# Patient Record
Sex: Female | Born: 1970
Health system: Southern US, Community
[De-identification: ages and names within clinical notes are randomized; demographics above are authoritative.]

## PROBLEM LIST (undated history)

## (undated) ENCOUNTER — Inpatient Hospital Stay (HOSPITAL_COMMUNITY): Payer: Self-pay

## (undated) DIAGNOSIS — R12 Heartburn: Secondary | ICD-10-CM

## (undated) DIAGNOSIS — F329 Major depressive disorder, single episode, unspecified: Secondary | ICD-10-CM

## (undated) DIAGNOSIS — I10 Essential (primary) hypertension: Secondary | ICD-10-CM

## (undated) DIAGNOSIS — M1990 Unspecified osteoarthritis, unspecified site: Secondary | ICD-10-CM

## (undated) DIAGNOSIS — E785 Hyperlipidemia, unspecified: Secondary | ICD-10-CM

## (undated) DIAGNOSIS — O26899 Other specified pregnancy related conditions, unspecified trimester: Secondary | ICD-10-CM

## (undated) DIAGNOSIS — J45909 Unspecified asthma, uncomplicated: Secondary | ICD-10-CM

## (undated) DIAGNOSIS — E559 Vitamin D deficiency, unspecified: Secondary | ICD-10-CM

## (undated) DIAGNOSIS — R51 Headache: Secondary | ICD-10-CM

## (undated) DIAGNOSIS — G2581 Restless legs syndrome: Secondary | ICD-10-CM

## (undated) DIAGNOSIS — F419 Anxiety disorder, unspecified: Secondary | ICD-10-CM

## (undated) DIAGNOSIS — E119 Type 2 diabetes mellitus without complications: Secondary | ICD-10-CM

## (undated) DIAGNOSIS — F32A Depression, unspecified: Secondary | ICD-10-CM

## (undated) HISTORY — DX: Anxiety disorder, unspecified: F41.9

## (undated) HISTORY — DX: Vitamin D deficiency, unspecified: E55.9

## (undated) HISTORY — PX: MOUTH SURGERY: SHX715

---

## 1998-03-25 ENCOUNTER — Other Ambulatory Visit: Admission: RE | Admit: 1998-03-25 | Discharge: 1998-03-25 | Payer: Self-pay | Admitting: Obstetrics and Gynecology

## 1999-03-13 ENCOUNTER — Other Ambulatory Visit: Admission: RE | Admit: 1999-03-13 | Discharge: 1999-03-13 | Payer: Self-pay | Admitting: Obstetrics and Gynecology

## 1999-12-19 ENCOUNTER — Emergency Department (HOSPITAL_COMMUNITY): Admission: EM | Admit: 1999-12-19 | Discharge: 1999-12-19 | Payer: Self-pay | Admitting: Emergency Medicine

## 2000-01-15 ENCOUNTER — Other Ambulatory Visit: Admission: RE | Admit: 2000-01-15 | Discharge: 2000-01-15 | Payer: Self-pay | Admitting: *Deleted

## 2001-02-28 ENCOUNTER — Other Ambulatory Visit: Admission: RE | Admit: 2001-02-28 | Discharge: 2001-02-28 | Payer: Self-pay | Admitting: *Deleted

## 2003-08-20 ENCOUNTER — Emergency Department (HOSPITAL_COMMUNITY): Admission: EM | Admit: 2003-08-20 | Discharge: 2003-08-20 | Payer: Self-pay | Admitting: Family Medicine

## 2005-07-31 ENCOUNTER — Ambulatory Visit (HOSPITAL_COMMUNITY): Admission: RE | Admit: 2005-07-31 | Discharge: 2005-07-31 | Payer: Self-pay | Admitting: Unknown Physician Specialty

## 2008-08-19 ENCOUNTER — Ambulatory Visit (HOSPITAL_COMMUNITY): Admission: RE | Admit: 2008-08-19 | Discharge: 2008-08-19 | Payer: Self-pay | Admitting: Emergency Medicine

## 2009-01-10 ENCOUNTER — Ambulatory Visit (HOSPITAL_COMMUNITY): Admission: RE | Admit: 2009-01-10 | Discharge: 2009-01-10 | Payer: Self-pay | Admitting: Emergency Medicine

## 2012-05-17 DIAGNOSIS — E119 Type 2 diabetes mellitus without complications: Secondary | ICD-10-CM

## 2012-05-17 HISTORY — DX: Type 2 diabetes mellitus without complications: E11.9

## 2012-07-01 ENCOUNTER — Inpatient Hospital Stay (HOSPITAL_COMMUNITY)
Admission: AD | Admit: 2012-07-01 | Discharge: 2012-07-01 | Disposition: A | Discharge status: Home / Self Care | Payer: Medicaid Other | Source: Ambulatory Visit | Attending: Obstetrics & Gynecology | Admitting: Obstetrics & Gynecology

## 2012-07-01 ENCOUNTER — Encounter (HOSPITAL_COMMUNITY): Payer: Self-pay | Admitting: *Deleted

## 2012-07-01 DIAGNOSIS — O99891 Other specified diseases and conditions complicating pregnancy: Secondary | ICD-10-CM | POA: Insufficient documentation

## 2012-07-01 DIAGNOSIS — N888 Other specified noninflammatory disorders of cervix uteri: Secondary | ICD-10-CM

## 2012-07-01 DIAGNOSIS — N949 Unspecified condition associated with female genital organs and menstrual cycle: Secondary | ICD-10-CM | POA: Insufficient documentation

## 2012-07-01 HISTORY — DX: Depression, unspecified: F32.A

## 2012-07-01 HISTORY — DX: Essential (primary) hypertension: I10

## 2012-07-01 HISTORY — DX: Major depressive disorder, single episode, unspecified: F32.9

## 2012-07-01 LAB — URINALYSIS, ROUTINE W REFLEX MICROSCOPIC
Bilirubin Urine: NEGATIVE
Glucose, UA: NEGATIVE mg/dL
Ketones, ur: NEGATIVE mg/dL
Nitrite: NEGATIVE
Protein, ur: NEGATIVE mg/dL
Specific Gravity, Urine: 1.025 (ref 1.005–1.030)
Urobilinogen, UA: 0.2 mg/dL (ref 0.0–1.0)
pH: 6 (ref 5.0–8.0)

## 2012-07-01 LAB — WET PREP, GENITAL
Clue Cells Wet Prep HPF POC: NONE SEEN
Trich, Wet Prep: NONE SEEN
Yeast Wet Prep HPF POC: NONE SEEN

## 2012-07-01 LAB — URINE MICROSCOPIC-ADD ON

## 2012-07-01 LAB — POCT PREGNANCY, URINE: Preg Test, Ur: POSITIVE — AB

## 2012-07-01 NOTE — MAU Provider Note (Signed)
History     CSN: [id number]  Arrival date and time: 07/01/12 4098   First Provider Initiated Contact with Patient 07/01/12 1118      Chief Complaint  Patient presents with  . Vaginal Bleeding   HPI Ms. Natalie Kim is a 42 y.o. G2P1001 at [redacted]w[redacted]d who presents to MAU today complaining of brown vaginal discharge. The patient states that she noticed that this morning after she went to the bathroom she had a small amount of brownish discharge when she wiped. She has not noticed any since then or on her pad. She denies other abnormal discharge before today or vaginal bleeding. She does have some low back pain, but this is not a new complaint and is mild today. She denies abdominal pain. She has had some nausea without vomiting. She has had URI symptoms for the last few days, but no true fever. She denies UTI symptoms. She has an appointment to start prenatal care at [facility] on Monday.   OB History   Grav Para Term Preterm Abortions TAB SAB Ect Mult Living   2 1 1       1       Past Medical History  Diagnosis Date  . Hypertension     Lisinopril (weaned off by December)  . Depression     D/C Prozac in December    History reviewed. No pertinent past surgical history.  Family History  Problem Relation Age of Onset  . Hypertension Mother   . Cancer Maternal Grandmother   . Cancer Maternal Grandfather     History  Substance Use Topics  . Smoking status: Never Smoker   . Smokeless tobacco: Not on file  . Alcohol Use: No    Allergies:  Allergies  Allergen Reactions  . Latex Itching and Swelling    Prescriptions prior to admission  Medication Sig Dispense Refill  . acetaminophen (TYLENOL) 325 MG tablet Take 325 mg by mouth every 6 (six) hours as needed for pain.      . Prenatal Vit-Fe Fumarate-FA (PRENATAL MULTIVITAMIN) TABS Take 1 tablet by mouth daily.        Review of Systems  Constitutional: Negative for fever, chills and malaise/fatigue.  HENT: Positive for  congestion.   Respiratory: Positive for cough and sputum production. Negative for shortness of breath and wheezing.   Cardiovascular: Negative for chest pain.  Gastrointestinal: Positive for nausea. Negative for heartburn, vomiting, abdominal pain, diarrhea and constipation.  Genitourinary: Negative for dysuria, urgency and frequency.       Neg vaginal bleeding + vaginal discharge  Neurological: Negative for headaches.   Physical Exam   Blood pressure 142/80, pulse 86, temperature 98.3 F (36.8 C), temperature source Oral, resp. rate 16, height 5\' 2"  (1.575 m), weight 232 lb (105.235 kg), last menstrual period 04/30/2012.  Physical Exam  Constitutional: She is oriented to person, place, and time. She appears well-developed and well-nourished. No distress.  HENT:  Head: Normocephalic and atraumatic.  Cardiovascular: Normal rate, regular rhythm and normal heart sounds.   Respiratory: Effort normal and breath sounds normal. No respiratory distress.  GI: Soft. Bowel sounds are normal. She exhibits no distension and no mass. There is no tenderness. There is no rebound and no guarding.  Genitourinary: Vagina normal. Uterus is not enlarged and not tender. Cervix exhibits friability (cervix appears very friable and a small amount of bleeding was noted after exam). Cervix exhibits no motion tenderness and no discharge. Right adnexum displays no mass and no tenderness. Left  adnexum displays no mass and no tenderness.  Neurological: She is alert and oriented to person, place, and time.  Skin: Skin is warm and dry. No erythema.  Psychiatric: She has a normal mood and affect.   Results for orders placed during the hospital encounter of 07/01/12 (from the past 24 hour(s))  URINALYSIS, ROUTINE W REFLEX MICROSCOPIC     Status: Abnormal   Collection Time    07/01/12 10:10 AM      Result Value Range   Color, Urine YELLOW  YELLOW   APPearance CLEAR  CLEAR   Specific Gravity, Urine 1.025  1.005 - 1.030    pH 6.0  5.0 - 8.0   Glucose, UA NEGATIVE  NEGATIVE mg/dL   Hgb urine dipstick MODERATE (*) NEGATIVE   Bilirubin Urine NEGATIVE  NEGATIVE   Ketones, ur NEGATIVE  NEGATIVE mg/dL   Protein, ur NEGATIVE  NEGATIVE mg/dL   Urobilinogen, UA 0.2  0.0 - 1.0 mg/dL   Nitrite NEGATIVE  NEGATIVE   Leukocytes, UA TRACE (*) NEGATIVE  URINE MICROSCOPIC-ADD ON     Status: Abnormal   Collection Time    07/01/12 10:10 AM      Result Value Range   Squamous Epithelial / LPF FEW (*) RARE   WBC, UA 7-10  <3 WBC/hpf   RBC / HPF 0-2  <3 RBC/hpf   Bacteria, UA MANY (*) RARE  POCT PREGNANCY, URINE     Status: Abnormal   Collection Time    07/01/12 10:20 AM      Result Value Range   Preg Test, Ur POSITIVE (*) NEGATIVE  WET PREP, GENITAL     Status: Abnormal   Collection Time    07/01/12 11:37 AM      Result Value Range   Yeast Wet Prep HPF POC NONE SEEN  NONE SEEN   Trich, Wet Prep NONE SEEN  NONE SEEN   Clue Cells Wet Prep HPF POC NONE SEEN  NONE SEEN   WBC, Wet Prep HPF POC MANY (*) NONE SEEN    MAU Course  Procedures None  MDM GC/Chlamydia pending  Assessment and Plan  A: Friable cervix in pregnancy Vaginal discharge  P: Discharge home Patient instructed to avoid anything in vagina Patient encouraged to keep appointment for Monday to start [facility] with [facility] Bleeding precautions discussed Patient may return to MAU as needed or if her condition should change or worsen   Freddi Starr, PA-C  07/01/2012, 11:24 AM

## 2012-07-01 NOTE — MAU Note (Signed)
Pt states that she woke up this morning and is having brownish discharge and was confirmed pregnant the end of January and is concerned because she did not having any problems with her last pregnancy

## 2012-07-01 NOTE — MAU Note (Signed)
"  Brown spotting noticed after after using BR this morning (with wiping).  Mild lower back cramping.  I'm not sure if that is from me not being very active the past couple of days.  I have my first OB appt on Monday 07/03/12."

## 2012-07-02 LAB — URINE CULTURE: Colony Count: 50000

## 2012-07-02 LAB — GC/CHLAMYDIA PROBE AMP
CT Probe RNA: NEGATIVE
GC Probe RNA: NEGATIVE

## 2012-07-04 ENCOUNTER — Encounter (HOSPITAL_COMMUNITY): Payer: Self-pay | Admitting: Obstetrics

## 2012-07-24 ENCOUNTER — Other Ambulatory Visit: Payer: Self-pay | Admitting: Obstetrics

## 2012-07-24 DIAGNOSIS — O09529 Supervision of elderly multigravida, unspecified trimester: Secondary | ICD-10-CM

## 2012-07-24 DIAGNOSIS — Z3682 Encounter for antenatal screening for nuchal translucency: Secondary | ICD-10-CM

## 2012-07-25 ENCOUNTER — Ambulatory Visit (HOSPITAL_COMMUNITY)
Admission: RE | Admit: 2012-07-25 | Discharge: 2012-07-25 | Disposition: A | Discharge status: Home / Self Care | Payer: Medicaid Other | Source: Ambulatory Visit | Attending: Obstetrics | Admitting: Obstetrics

## 2012-07-25 ENCOUNTER — Encounter (HOSPITAL_COMMUNITY): Payer: Self-pay

## 2012-07-25 VITALS — BP 132/77 | HR 93 | Wt 237.5 lb

## 2012-07-25 DIAGNOSIS — O351XX0 Maternal care for (suspected) chromosomal abnormality in fetus, not applicable or unspecified: Secondary | ICD-10-CM | POA: Insufficient documentation

## 2012-07-25 DIAGNOSIS — O09529 Supervision of elderly multigravida, unspecified trimester: Secondary | ICD-10-CM | POA: Insufficient documentation

## 2012-07-25 DIAGNOSIS — Z3689 Encounter for other specified antenatal screening: Secondary | ICD-10-CM

## 2012-07-25 DIAGNOSIS — F32A Depression, unspecified: Secondary | ICD-10-CM

## 2012-07-25 DIAGNOSIS — I10 Essential (primary) hypertension: Secondary | ICD-10-CM

## 2012-07-25 DIAGNOSIS — O09519 Supervision of elderly primigravida, unspecified trimester: Secondary | ICD-10-CM | POA: Insufficient documentation

## 2012-07-25 DIAGNOSIS — O337XX0 Maternal care for disproportion due to other fetal deformities, not applicable or unspecified: Secondary | ICD-10-CM | POA: Insufficient documentation

## 2012-07-25 DIAGNOSIS — F329 Major depressive disorder, single episode, unspecified: Secondary | ICD-10-CM

## 2012-07-25 DIAGNOSIS — Z3682 Encounter for antenatal screening for nuchal translucency: Secondary | ICD-10-CM

## 2012-07-25 DIAGNOSIS — O352XX0 Maternal care for (suspected) hereditary disease in fetus, not applicable or unspecified: Secondary | ICD-10-CM | POA: Insufficient documentation

## 2012-07-25 DIAGNOSIS — O3510X0 Maternal care for (suspected) chromosomal abnormality in fetus, unspecified, not applicable or unspecified: Secondary | ICD-10-CM | POA: Insufficient documentation

## 2012-07-25 DIAGNOSIS — O09521 Supervision of elderly multigravida, first trimester: Secondary | ICD-10-CM

## 2012-07-25 NOTE — Progress Notes (Addendum)
Maternal Fetal Medicine Consultation Requesting Provider(s): [facility] Primary OB: Dr Clearance Coots Reason for consultation: 42 yo P1 at 12+ weeks, with [facility], and previous LGA fetus (9.12lbs)  HPI: As above.  OB History: G1 1995 SVD, pushed for 3 hours, and stated that there was no dystocia; she stated that they only had to place hands underneath the babies axilla to deliver her. Stated that she didnt have GDM. G2 present PMH: Obesity, Chronic HTN ( was on Lisinopril, started 7 years ago. Weaned in consultation with PMD, and finally stopped in 04/2012, prior to conception. Followed at PMD and states that she has normal BP's), and Depression PSH: History reviewed. No pertinent past surgical history Meds: PNV Allergies: Denies any food or drug allergies FH: History reviewed. No pertinent family history of diabetes, hypertension, cancers, heart disease, genetic abnormalities, and birth defects Soc: Denies any personal use or exposure to tobacco, alcohol, and illegal drugs.  Preconception Review of Systems: no vaginal bleeding or cramping, no nausea/vomiting. All other systems reviewed and are negative  PNL: N/A PE:  VS: BP      132/77        pulse     93            weight 237lb  GEN: well-appearing female, resting in chair in no acute distress  A/P: 42 yo P1 at 12+ weeks, with chronic hypertension, obesity, depression, and previous LGA fetus   Previous LGA fetus:  I discussed that women with previous LGA fetuses, are at risk for recurrence. We discussed the complications including but not limited to: labor dytocia and intrapartum fetal demise, post partum hemorrhage, and need for cesarean section.   History of Depression: Patient with history of depression, and she currentlys topped taking all medications on her own volition; secondary to fear of fetal effect. We advise to monitor closely for signs of depression and restart medication, SSRI, as needed.  Although there may be some risk  to fetus, the risks of untreated depression is worse.  Patient aware of signs of depression, and reported that she is currently doing well. Would not recommend Paxil during pregnancy as risk of fetal pulmonary hypertension and possible cardiac effects of concern.  Others are category C, but have no proven adverse effects.  Risks verses benefits should be reviewed again with patient if treatment started. With a history of depression and previous post partum depression this patient is at increased risk for recurrent post partum depression and should be monitored very closely following delivery.  Chronic Hypertension:  I discussed that women with preexistent hypertension are at increased risk of adverse pregnancy outcome, including preeclampsia, abruption, fetal growth restriction, and perinatal death. The risk increases with severity of hypertension and presence of end-organ damage. Risk of superimposed preeclampsia is 10 to 25 %; risk of abruption is 0.7 to 1.5 %; and risk of fetal growth restriction is 8 to 16 %. I also discussed that risk of preterm delivery is increased and is usually iatrogenic from the complications listed above. Risk of preterm delivery in women with chronic hypertension is 12-34%. There is also an increased risk of requiring C section for the above reasons. Patient' hypertension is currently managed without medication. I would recommend targeting therapy to keep systolic blood pressures <150 and diastolic blood pressures <100. If needs medical management may use labetalol- category C; may have increased risk of fetal growth restriction. I recommend obtaining baseline studies: EKG, 24 hour urine for total protein, CBC, AST, ALT, BUN,  creatinine, and uric acid. I recommend serial ultrasounds for fetal growth every 4 weeks starting at [redacted] weeks gestation. I recommend close surveillance for the development of signs/symptoms of preeclampsia especially in the third trimester. I recommend  antenatal testing to start at [redacted] weeks gestation. Recommend delivery by estimated due date but not prior to 38 weeks in the absence of other complications.  Obesity We discussed that obesity in pregnancy is associated with an increased risk of both early fetal loss and stillbirth. The reason for this increased still-birth risk with advancing gestation is likely multifactorial, but associated with placental dysfunction. The obese gravida is also at increased risk for preeclampsia, gestational or overt diabetes, other endocrinopathies (hypothyroidism), anesthetic complications related to sleep apnea, psychiatric disease including depression, risk for dysfunctional labor leading to a higher Cesarean delivery rate, and increased risk for thromboembolic disease.   AMA:  The patient was counseled by our genetics Counselor. Chromosomal as well as other risks of advanced maternal age were discussed, including but not limited to: increased risk for gestational diabetes, hypertensive disorders of pregnancy, fetal anomalies, and preterm delivery, and need for cesarean delivery.  Patient declined aneuploidy screening today.  Recommendations:  - Daily light exercise and dietary modifications under the supervision of a nutritionist, to maintain weight during pregnancy as she is morbidly obese and should only gain <11lbs for the entire pregnancy. - Aneuploidy screening if she changes her mind at any point in the pregnancy NIPT -Early diabetes screening and treatment if applicable -We recommend obtaining baseline studies: EKG, 24 hour urine for total protein, TSH, CBC, AST, ALT, BUN, creatinine, and uric acid.  -Prenatal compression stockings  -Recommend starting Labetalol if BP meds are required.  - Fetal surveillance in the form of:  - Detailed anatomical evaluation - Fetal echocardiogram - Serial growth ultrasounds (every 4weeks)  - Antepartum testing in the form of twice weekly non-stress tests with weekly  afi, or 2x weekly biophysical profile assessment to begin at 32 weeks. Testing may be initiated prior to then if any abnormalities of growth, fluid, placental blood flow, or worsening maternal status are discovered. -Recommend delivery at [facility], but no earlier than 38 weeks if remains stable. Delivery recommended sooner if clinically indicated.  Patient was seen and evaluated and case reviewed with Dr. Vincenza Hews  Thank you for the opportunity to be a part of the care of Mrs. Natalie Kim.  We will continue to follow her with ultrasounds in our [facility]. Please contact our office if we can be of further assistance.   I spent approximately 30 minutes with this patient with over 50% of time spent in face-to-face counseling.  MFM Attending  I counseled the patient with the fellow and agree with above note.  Eulis Foster, MD

## 2012-07-25 NOTE — Progress Notes (Addendum)
Genetic Counseling  High-Risk Gestation Note  Appointment Date:  07/25/2012 Referred By: Brock Bad, MD Date of Birth:  [birth year] Partner:  Natalie Kim   Pregnancy History: G2P1001 Estimated Date of Delivery: 02/04/13 Estimated Gestational Age: [redacted]w[redacted]d Attending: Eulis Foster, MD    Ms. Natalie Kim and her partner, Mr. Natalie Kim, were seen for genetic counseling because of a maternal age of 42 y.o.Marland Kitchen     They were counseled regarding maternal age and the association with risk for chromosome conditions due to nondisjunction with aging of the ova.   We reviewed chromosomes, nondisjunction, and the associated 1 in 43 risk for fetal aneuploidy related to a maternal age of 42 y.o. at [redacted]w[redacted]d gestation.  They were counseled that the risk for aneuploidy decreases as gestational age increases, accounting for those pregnancies which spontaneously abort.  We specifically discussed Down syndrome (trisomy 29), trisomies 75 and 54, and sex chromosome aneuploidies (47,XXX and 47,XXY) including the common features and prognoses of each.   We reviewed available screening options including First screen, Quad screen, noninvasive prenatal testing (NIPT), and detailed ultrasound. They understand that screening tests are used to modify a patient's a priori risk for aneuploidy, typically based on age.  This estimate provides a pregnancy specific risk assessment.  Specifically, we discussed that NIPT analyzes cell free fetal DNA found in the maternal circulation. This test is not diagnostic for chromosome conditions, but can provide information regarding the presence or absence of extra fetal DNA for chromosomes 13, 18, 21, X, and Y, and missing fetal DNA for chromosome X and Y (Turner syndrome). Thus, it would not identify or rule out all genetic conditions. The reported detection rate is greater than 99% for Trisomy 21, greater than 98% for Trisomy 18, and is approximately 80% (8 out of 10) for Trisomy 13.  The false positive rate is reported to be less than 0.1% for any of these conditions.  In addition, we discussed that ~50-80% of fetuses with Down syndrome and up to 90-95% of fetuses with trisomy 18/13, when well visualized, have detectable anomalies or soft markers by detailed ultrasound (~18+ weeks gestation).   They were also counseled regarding diagnostic testing via CVS or amniocentesis.  We reviewed the approximate 1 in 100 risk for complications for CVS and the approximate 1 in 300-500 risk for complications for amniocentesis, including spontaneous pregnancy loss. We discussed the risks, limitations, and benefits of each screening and testing option. After consideration of all the options, they declined NIPT, first trimester screening, Quad screening, CVS, and amniocentesis. Ms. Natalie Kim stated that she was not interested in CVS or amniocentesis during the pregnancy given the associated risk of complications. She stated that she may consider pursuing NIPT and possibly AFP only but elected to first wait for information from second trimester ultrasound assessment. Detailed ultrasound was scheduled for 09/05/12.  A complete ultrasound was performed today for nuchal translucency assessment.  The ultrasound report will be sent under separate cover. She understands that ultrasound cannot rule out all birth defects or genetic syndromes.   Ms. Natalie Kim was provided with written information regarding cystic fibrosis (CF) including the carrier frequency and incidence in the Caucasian population, the availability of carrier testing and prenatal diagnosis if indicated.  In addition, we discussed that CF is routinely screened for as part of the [place] newborn screening panel.  She declined testing today.   Both family histories were reviewed and found to be noncontributory for birth defects, mental  retardation, and known genetic conditions. Without further information regarding the provided family  history, an accurate genetic risk cannot be calculated. Further genetic counseling is warranted if more information is obtained.  Ms. Natalie Kim denied exposure to environmental toxins or chemical agents. She denied the use of alcohol, tobacco or street drugs following conception. She denied significant viral illnesses during the course of her pregnancy. Her medical and surgical histories were contributory for depression, for which she was taking Prozac and hypertension, for which she was taking lisinopril. She discontinued these medications in December. We discussed that available data regarding Prozac use during pregnancy does not indicate an expected increased risk for birth defects.  Using Prozac late in pregnancy can increase the chance for a mild transient neonatal syndrome of central nervous system including irritability, vomiting, jitteriness and convulsions, as well as neonatal pulmonary hypertension.  These associated symptoms typically do not appear to occur frequently or severely. Even though a limited number of medicines are known to cause birth defects, we cannot say that it is completely safe to use any medicines during pregnancy.  It is also not possible to predict any drug-drug interactions that occur, or how they might affect a pregnancy.  The use of medications is recommended in pregnancy only if the benefit to the mother (and thus the pregnancy) outweighs potential risks to the baby.  Sometimes the maternal use of medications, dictated by a medical condition, may even be more beneficial to a pregnancy than not taking the medication(s) at all. Natalie Kim was also seen for Maternal-Fetal Medicine consultation at the time of today's visit given her medical history and her history of previous delivery large for gestational age infant. See separate MFM consult note for detailed discussion.    I counseled this couple regarding the above risks and available options.  The approximate  face-to-face time with the genetic counselor was 40 minutes.  Natalie Plowman, MS,  Certified Genetic Counselor  07/25/2012

## 2012-07-25 NOTE — Progress Notes (Signed)
[facility] ultrasound  Indication: 42 yr old G2P1001 at [redacted]w[redacted]d with chronic hypertension and obesity for fetal ultrasound.  Findings: 1. Single intrauterine pregnancy. 2. Fetal crown rump length is consistent with dating. 3. Normal uterus; no adnexal masses seen. 4. Evaluation of fetal anatomy is limited by early gestational age. 5. The nuchal translucency could not be obtained secondary to fetal position. 6. The nasal bone was not visualized.  Recommendations: 1. Advanced maternal age: - see consult letter - patient met with genetic counselor; see separate report - patient declined aneuploidy screening - recommend serial fetal growth in the third trimester - recommend antenatal testing as below 2. Chronic  hypertension: - see consult letter - currently well controlled off of medication - recommend fetal growth every 4 weeks starting at [redacted] weeks gestation - recommend antenatal testing starting at [redacted] weeks gestation - recommend delivery by estimated due date but not prior to 38 weeks in the absence of other complications 3. Obesity: - see consult letter - recommend fetal growth surveillance as above 4. History of macrosomic infant: - recommend early 1 hour glucola - fetal growth surveillance as above 5. Recommend fetal anatomic survey at 18-[redacted] weeks gestation 6. Recommend maternal serum AFP at 15-[redacted] weeks gestation 7. Depression: - see consult letter  Eulis Foster, MD

## 2012-07-26 ENCOUNTER — Other Ambulatory Visit: Payer: Self-pay | Admitting: Obstetrics

## 2012-07-26 DIAGNOSIS — O09529 Supervision of elderly multigravida, unspecified trimester: Secondary | ICD-10-CM

## 2012-07-26 DIAGNOSIS — Z3682 Encounter for antenatal screening for nuchal translucency: Secondary | ICD-10-CM

## 2012-08-11 ENCOUNTER — Encounter: Payer: Self-pay | Admitting: *Deleted

## 2012-08-11 NOTE — Progress Notes (Unsigned)
Pulse: 84

## 2012-08-14 ENCOUNTER — Encounter: Payer: Self-pay | Admitting: Obstetrics

## 2012-08-14 ENCOUNTER — Ambulatory Visit (INDEPENDENT_AMBULATORY_CARE_PROVIDER_SITE_OTHER): Payer: Medicaid Other | Admitting: Obstetrics

## 2012-08-14 VITALS — BP 117/74 | Temp 98.9°F | Wt 236.6 lb

## 2012-08-14 DIAGNOSIS — Z3480 Encounter for supervision of other normal pregnancy, unspecified trimester: Secondary | ICD-10-CM

## 2012-08-14 DIAGNOSIS — Z3482 Encounter for supervision of other normal pregnancy, second trimester: Secondary | ICD-10-CM

## 2012-08-14 LAB — POCT URINALYSIS DIPSTICK
Bilirubin, UA: NEGATIVE
Blood, UA: NEGATIVE
Clarity, UA: NEGATIVE
Glucose, UA: 100
Nitrite, UA: NEGATIVE
Spec Grav, UA: >=1.03
Urobilinogen, UA: NEGATIVE
pH, UA: 5

## 2012-08-14 NOTE — Progress Notes (Signed)
No complaints. Doing well.

## 2012-08-14 NOTE — Progress Notes (Signed)
Pulse- 98 

## 2012-09-04 ENCOUNTER — Inpatient Hospital Stay (HOSPITAL_COMMUNITY)
Admission: AD | Admit: 2012-09-04 | Discharge: 2012-09-04 | Disposition: A | Discharge status: Home / Self Care | Payer: Medicaid Other | Source: Ambulatory Visit | Attending: Obstetrics & Gynecology | Admitting: Obstetrics & Gynecology

## 2012-09-04 ENCOUNTER — Encounter (HOSPITAL_COMMUNITY): Payer: Self-pay | Admitting: *Deleted

## 2012-09-04 DIAGNOSIS — N949 Unspecified condition associated with female genital organs and menstrual cycle: Secondary | ICD-10-CM | POA: Insufficient documentation

## 2012-09-04 DIAGNOSIS — O9989 Other specified diseases and conditions complicating pregnancy, childbirth and the puerperium: Secondary | ICD-10-CM

## 2012-09-04 DIAGNOSIS — R102 Pelvic and perineal pain: Secondary | ICD-10-CM

## 2012-09-04 DIAGNOSIS — R109 Unspecified abdominal pain: Secondary | ICD-10-CM | POA: Insufficient documentation

## 2012-09-04 DIAGNOSIS — O26899 Other specified pregnancy related conditions, unspecified trimester: Secondary | ICD-10-CM

## 2012-09-04 DIAGNOSIS — O99891 Other specified diseases and conditions complicating pregnancy: Secondary | ICD-10-CM | POA: Insufficient documentation

## 2012-09-04 HISTORY — DX: Hyperlipidemia, unspecified: E78.5

## 2012-09-04 LAB — URINALYSIS, ROUTINE W REFLEX MICROSCOPIC
Bilirubin Urine: NEGATIVE
Glucose, UA: NEGATIVE mg/dL
Ketones, ur: 40 mg/dL — AB
Leukocytes, UA: NEGATIVE
Nitrite: NEGATIVE
Protein, ur: NEGATIVE mg/dL
Specific Gravity, Urine: >1.03 — ABNORMAL HIGH (ref 1.005–1.030)
Urobilinogen, UA: 0.2 mg/dL (ref 0.0–1.0)
pH: 5.5 (ref 5.0–8.0)

## 2012-09-04 LAB — WET PREP, GENITAL
Clue Cells Wet Prep HPF POC: NONE SEEN
Trich, Wet Prep: NONE SEEN
Yeast Wet Prep HPF POC: NONE SEEN

## 2012-09-04 LAB — URINE MICROSCOPIC-ADD ON

## 2012-09-04 NOTE — MAU Note (Signed)
Pain is noticed with movement. Not so much when sitting still.

## 2012-09-04 NOTE — MAU Note (Signed)
Pt states lower abd pain, worse with movement. Could be gas, has not stopped since 10 am. Feels pressure in same area, suprapubic. Denies burning with voiding, no bleeding, no abnormal vag d/c.

## 2012-09-04 NOTE — MAU Provider Note (Signed)
  History     CSN: [id number]  Arrival date and time: 09/04/12 1316   None     Chief Complaint  Patient presents with  . Abdominal Pain   HPI 42 y.o. G2P1001 at [redacted]w[redacted]d with low abd/pelvic pain worse with position change and movement since this morning, no bleeding. Has OB appointment tomorrow.    Past Medical History  Diagnosis Date  . Hypertension     Lisinopril (weaned off by December)  . Depression     D/C Prozac in December  . Hyperlipidemia     Past Surgical History  Procedure Laterality Date  . Mouth surgery      Family History  Problem Relation Age of Onset  . Hypertension Mother   . Cancer Maternal Grandmother   . Cancer Maternal Grandfather     History  Substance Use Topics  . Smoking status: Never Smoker   . Smokeless tobacco: Not on file  . Alcohol Use: No    Allergies:  Allergies  Allergen Reactions  . Latex Itching and Swelling    Prescriptions prior to admission  Medication Sig Dispense Refill  . acetaminophen (TYLENOL) 325 MG tablet Take 325 mg by mouth every 6 (six) hours as needed for pain (headache).       . Prenatal Vit-Fe Fumarate-FA (PRENATAL MULTIVITAMIN) TABS Take 1 tablet by mouth daily.        Review of Systems  Constitutional: Negative.   Respiratory: Negative.   Cardiovascular: Negative.   Gastrointestinal: Positive for abdominal pain. Negative for nausea, vomiting, diarrhea and constipation.  Genitourinary: Negative for dysuria, urgency, frequency, hematuria and flank pain.       Negative for vaginal bleeding, vaginal discharge  Musculoskeletal: Negative.   Neurological: Negative.   Psychiatric/Behavioral: Negative.    Physical Exam   Blood pressure 115/68, pulse 101, temperature 98.1 F (36.7 C), temperature source Oral, resp. rate 16, height 5\' 2"  (1.575 m), weight 239 lb 4 oz (108.523 kg), last menstrual period 04/30/2012.  Physical Exam  Nursing note and vitals reviewed. Constitutional: She is oriented to person,  place, and time. She appears well-developed and well-nourished. No distress.  Cardiovascular: Normal rate.   Respiratory: Effort normal.  GI: Soft. She exhibits no mass. There is no tenderness. There is no rebound and no guarding.  Genitourinary: Vaginal discharge (thin, white, nonmalodorous) found.  Cervix long/thick/closed   Musculoskeletal: Normal range of motion.  Neurological: She is alert and oriented to person, place, and time.  Skin: Skin is warm and dry.  Psychiatric: She has a normal mood and affect.    MAU Course  Procedures Wet prep negative GC/CT collected Fern negative  Assessment and Plan   1. Pain of round ligament complicating pregnancy, antepartum   Rev'd precautions, f/u tomorrow as scheduled    Medication List    TAKE these medications       acetaminophen 325 MG tablet  Commonly known as:  TYLENOL  Take 325 mg by mouth every 6 (six) hours as needed for pain (headache).     prenatal multivitamin Tabs  Take 1 tablet by mouth daily.            Follow-up Information   Follow up with HARPER,CHARLES A, MD. (as scheduled tomorrow)    Contact information:   [address] [place] [address] [phone]         [facility] 09/04/2012, 3:43 PM

## 2012-09-05 ENCOUNTER — Encounter (HOSPITAL_COMMUNITY): Payer: Self-pay

## 2012-09-05 ENCOUNTER — Ambulatory Visit (HOSPITAL_COMMUNITY)
Admission: RE | Admit: 2012-09-05 | Discharge: 2012-09-05 | Disposition: A | Discharge status: Home / Self Care | Payer: Medicaid Other | Source: Ambulatory Visit | Attending: Obstetrics | Admitting: Obstetrics

## 2012-09-05 ENCOUNTER — Encounter: Payer: Self-pay | Admitting: Obstetrics

## 2012-09-05 VITALS — BP 132/79 | HR 104 | Wt 238.0 lb

## 2012-09-05 DIAGNOSIS — O09519 Supervision of elderly primigravida, unspecified trimester: Secondary | ICD-10-CM | POA: Insufficient documentation

## 2012-09-05 DIAGNOSIS — O352XX0 Maternal care for (suspected) hereditary disease in fetus, not applicable or unspecified: Secondary | ICD-10-CM | POA: Insufficient documentation

## 2012-09-05 DIAGNOSIS — Z1389 Encounter for screening for other disorder: Secondary | ICD-10-CM | POA: Insufficient documentation

## 2012-09-05 DIAGNOSIS — O337XX0 Maternal care for disproportion due to other fetal deformities, not applicable or unspecified: Secondary | ICD-10-CM | POA: Insufficient documentation

## 2012-09-05 DIAGNOSIS — O358XX0 Maternal care for other (suspected) fetal abnormality and damage, not applicable or unspecified: Secondary | ICD-10-CM | POA: Insufficient documentation

## 2012-09-05 DIAGNOSIS — O09522 Supervision of elderly multigravida, second trimester: Secondary | ICD-10-CM

## 2012-09-05 DIAGNOSIS — Z363 Encounter for antenatal screening for malformations: Secondary | ICD-10-CM | POA: Insufficient documentation

## 2012-09-05 DIAGNOSIS — Z3689 Encounter for other specified antenatal screening: Secondary | ICD-10-CM

## 2012-09-05 DIAGNOSIS — O09529 Supervision of elderly multigravida, unspecified trimester: Secondary | ICD-10-CM

## 2012-09-05 LAB — GC/CHLAMYDIA PROBE AMP
CT Probe RNA: NEGATIVE
GC Probe RNA: NEGATIVE

## 2012-09-05 NOTE — Progress Notes (Signed)
Natalie Kim  was seen today for an ultrasound appointment.  See full report in [facility].  Impression: Single IUP at 18 2/7 weeks Normal anatomic fetal survey; however, somewhat limited views of the fetal heart  and profile were obtained No markers associated with aneuploidy noted Normal amniotic fluid volume noted  Recommendations: Recommend follow-up ultrasound examination in 4 weeks to reevaluate the fetal heart and face  Alpha Gula, MD

## 2012-09-06 ENCOUNTER — Encounter: Payer: Self-pay | Admitting: Obstetrics

## 2012-09-11 ENCOUNTER — Ambulatory Visit (INDEPENDENT_AMBULATORY_CARE_PROVIDER_SITE_OTHER): Payer: Medicaid Other | Admitting: Obstetrics

## 2012-09-11 ENCOUNTER — Encounter: Payer: Self-pay | Admitting: Obstetrics

## 2012-09-11 VITALS — BP 118/77 | Temp 98.0°F | Wt 241.0 lb

## 2012-09-11 DIAGNOSIS — Z3482 Encounter for supervision of other normal pregnancy, second trimester: Secondary | ICD-10-CM

## 2012-09-11 DIAGNOSIS — Z3480 Encounter for supervision of other normal pregnancy, unspecified trimester: Secondary | ICD-10-CM

## 2012-09-11 LAB — POCT URINALYSIS DIPSTICK
Bilirubin, UA: NEGATIVE
Blood, UA: NEGATIVE
Glucose, UA: NEGATIVE
Leukocytes, UA: NEGATIVE
Nitrite, UA: NEGATIVE
Spec Grav, UA: 1.025
Urobilinogen, UA: NEGATIVE
pH, UA: 5

## 2012-09-11 LAB — HIV ANTIBODY (ROUTINE TESTING W REFLEX): HIV: NONREACTIVE

## 2012-09-11 NOTE — Progress Notes (Signed)
Pulse-89 No complaints.  

## 2012-09-12 LAB — OBSTETRIC PANEL
Antibody Screen: NEGATIVE
Basophils Absolute: 0 10*3/uL (ref 0.0–0.1)
Basophils Relative: 0 % (ref 0–1)
Eosinophils Absolute: 0.1 10*3/uL (ref 0.0–0.7)
Eosinophils Relative: 1 % (ref 0–5)
HCT: 38.1 % (ref 36.0–46.0)
Hemoglobin: 13 g/dL (ref 12.0–15.0)
Hepatitis B Surface Ag: NEGATIVE
Lymphocytes Relative: 15 % (ref 12–46)
Lymphs Abs: 1.2 10*3/uL (ref 0.7–4.0)
MCH: 30.4 pg (ref 26.0–34.0)
MCHC: 34.1 g/dL (ref 30.0–36.0)
MCV: 89 fL (ref 78.0–100.0)
Monocytes Absolute: 0.3 10*3/uL (ref 0.1–1.0)
Monocytes Relative: 4 % (ref 3–12)
Neutro Abs: 6 10*3/uL (ref 1.7–7.7)
Neutrophils Relative %: 80 % — ABNORMAL HIGH (ref 43–77)
Platelets: 205 10*3/uL (ref 150–400)
RBC: 4.28 MIL/uL (ref 3.87–5.11)
RDW: 14.3 % (ref 11.5–15.5)
Rh Type: POSITIVE
Rubella: 2.32 Index — ABNORMAL HIGH (ref <0.90)
WBC: 7.6 10*3/uL (ref 4.0–10.5)

## 2012-09-12 LAB — VITAMIN D 25 HYDROXY (VIT D DEFICIENCY, FRACTURES): Vit D, 25-Hydroxy: 23 ng/mL — ABNORMAL LOW (ref 30–89)

## 2012-09-13 LAB — HEMOGLOBINOPATHY EVALUATION
Hemoglobin Other: 0 %
Hgb A2 Quant: 2.9 % (ref 2.2–3.2)
Hgb A: 96.8 % (ref 96.8–97.8)
Hgb F Quant: 0.3 % (ref 0.0–2.0)
Hgb S Quant: 0 %

## 2012-09-13 LAB — CULTURE, OB URINE: Colony Count: 25000

## 2012-10-03 ENCOUNTER — Ambulatory Visit (HOSPITAL_COMMUNITY)
Admission: RE | Admit: 2012-10-03 | Discharge: 2012-10-03 | Disposition: A | Discharge status: Home / Self Care | Payer: Medicaid Other | Source: Ambulatory Visit | Attending: Obstetrics | Admitting: Obstetrics

## 2012-10-03 ENCOUNTER — Encounter (HOSPITAL_COMMUNITY): Payer: Self-pay

## 2012-10-03 VITALS — BP 113/72 | HR 97 | Wt 243.0 lb

## 2012-10-03 DIAGNOSIS — O09519 Supervision of elderly primigravida, unspecified trimester: Secondary | ICD-10-CM | POA: Insufficient documentation

## 2012-10-03 DIAGNOSIS — Z3689 Encounter for other specified antenatal screening: Secondary | ICD-10-CM | POA: Insufficient documentation

## 2012-10-03 DIAGNOSIS — O352XX0 Maternal care for (suspected) hereditary disease in fetus, not applicable or unspecified: Secondary | ICD-10-CM | POA: Insufficient documentation

## 2012-10-03 DIAGNOSIS — O09522 Supervision of elderly multigravida, second trimester: Secondary | ICD-10-CM

## 2012-10-03 DIAGNOSIS — O337XX0 Maternal care for disproportion due to other fetal deformities, not applicable or unspecified: Secondary | ICD-10-CM | POA: Insufficient documentation

## 2012-10-03 NOTE — Progress Notes (Signed)
[facility]  Indication: 42 yr old G2P1001 at [redacted]w[redacted]d with chronic hypertension and obesity for follow up fetal ultrasound.  Findings: 1. Single intrauterine pregnancy. 2. Estimated fetal weight is in the 56th%. 3. Anterior placenta without evidence of previa. 4. Normal amniotic fluid volume. 5. The limited anatomy survey is normal. Any anatomy not evaluated on today's exam was evaluated on the previous exam.  Recommendations: 1. Appropriate fetal growth. 2. Fetal anatomic survey is complete. 3.  Advanced maternal age: - previously counseled - patient declined aneuploidy screening - recommend serial fetal growth in the third trimester - recommend antenatal testing as below 2. Chronic  hypertension: - previously counseled - currently well controlled off of medication - recommend fetal growth every 4 weeks starting at [redacted] weeks gestation - recommend antenatal testing starting at [redacted] weeks gestation - recommend delivery by estimated due date but not prior to 38 weeks in the absence of other complications 3. Obesity: - previously counseled - recommend fetal growth surveillance as above 4. History of macrosomic infant: - recommend early 1 hour glucola - fetal growth surveillance as above  Eulis Foster, MD

## 2012-10-12 ENCOUNTER — Ambulatory Visit (INDEPENDENT_AMBULATORY_CARE_PROVIDER_SITE_OTHER): Payer: Medicaid Other | Admitting: Obstetrics

## 2012-10-12 ENCOUNTER — Encounter: Payer: Self-pay | Admitting: Obstetrics

## 2012-10-12 ENCOUNTER — Other Ambulatory Visit: Payer: Medicaid Other | Admitting: *Deleted

## 2012-10-12 VITALS — BP 119/79 | Temp 98.3°F | Wt 244.0 lb

## 2012-10-12 DIAGNOSIS — Z3480 Encounter for supervision of other normal pregnancy, unspecified trimester: Secondary | ICD-10-CM

## 2012-10-12 DIAGNOSIS — Z3482 Encounter for supervision of other normal pregnancy, second trimester: Secondary | ICD-10-CM

## 2012-10-12 LAB — POCT URINALYSIS DIPSTICK
Bilirubin, UA: NEGATIVE
Blood, UA: NEGATIVE
Glucose, UA: NEGATIVE
Ketones, UA: NEGATIVE
Leukocytes, UA: NEGATIVE
Nitrite, UA: NEGATIVE
Spec Grav, UA: 1.02
Urobilinogen, UA: NEGATIVE
pH, UA: 5

## 2012-10-12 NOTE — Progress Notes (Signed)
P 97 Patient reports some pain in upper abdomen - usually at end of the day.

## 2012-10-13 ENCOUNTER — Encounter: Payer: Self-pay | Admitting: Obstetrics

## 2012-10-13 LAB — GLUCOSE TOLERANCE, 2 HOURS W/ 1HR
Glucose, 1 hour: 220 mg/dL — ABNORMAL HIGH (ref 70–170)
Glucose, 2 hour: 169 mg/dL — ABNORMAL HIGH (ref 70–139)
Glucose, Fasting: 103 mg/dL — ABNORMAL HIGH (ref 70–99)

## 2012-10-13 LAB — CBC
HCT: 37.3 % (ref 36.0–46.0)
Hemoglobin: 12.5 g/dL (ref 12.0–15.0)
MCH: 31.1 pg (ref 26.0–34.0)
MCHC: 33.5 g/dL (ref 30.0–36.0)
MCV: 92.8 fL (ref 78.0–100.0)
Platelets: 220 10*3/uL (ref 150–400)
RBC: 4.02 MIL/uL (ref 3.87–5.11)
RDW: 14.5 % (ref 11.5–15.5)
WBC: 8.6 10*3/uL (ref 4.0–10.5)

## 2012-10-13 LAB — RPR

## 2012-10-13 LAB — HIV ANTIBODY (ROUTINE TESTING W REFLEX): HIV: NONREACTIVE

## 2012-10-19 ENCOUNTER — Encounter: Payer: Self-pay | Admitting: Obstetrics

## 2012-10-24 ENCOUNTER — Telehealth: Payer: Self-pay | Admitting: *Deleted

## 2012-10-24 MED ORDER — BUPROPION HCL 100 MG PO TABS: ORAL_TABLET | ORAL | Status: DC

## 2012-10-24 NOTE — Telephone Encounter (Signed)
Call placed to patient [phone], no answer. A voice message was left informing patient call was being return regarding my chart message for medication refill. Patient was advised to return phone call for clarification.  After speaking with Dr Clearance Coots, my chart message sent to patient informing patient of Rx for Wellbutrin sent to [facility].  No further action is required.

## 2012-10-26 ENCOUNTER — Ambulatory Visit (INDEPENDENT_AMBULATORY_CARE_PROVIDER_SITE_OTHER): Payer: Medicaid Other | Admitting: Obstetrics

## 2012-10-26 ENCOUNTER — Encounter: Payer: Self-pay | Admitting: *Deleted

## 2012-10-26 VITALS — BP 116/80 | Temp 97.9°F | Wt 246.0 lb

## 2012-10-26 DIAGNOSIS — O99810 Abnormal glucose complicating pregnancy: Secondary | ICD-10-CM

## 2012-10-26 DIAGNOSIS — Z3482 Encounter for supervision of other normal pregnancy, second trimester: Secondary | ICD-10-CM

## 2012-10-26 DIAGNOSIS — O24419 Gestational diabetes mellitus in pregnancy, unspecified control: Secondary | ICD-10-CM | POA: Insufficient documentation

## 2012-10-26 DIAGNOSIS — Z3480 Encounter for supervision of other normal pregnancy, unspecified trimester: Secondary | ICD-10-CM

## 2012-10-26 LAB — POCT URINALYSIS DIPSTICK
Bilirubin, UA: NEGATIVE
Nitrite, UA: NEGATIVE
Spec Grav, UA: 1.025
Urobilinogen, UA: NEGATIVE
pH, UA: 5

## 2012-10-26 MED ORDER — GLYBURIDE 2.5 MG PO TABS: 2.5000 mg | ORAL_TABLET | Freq: Two times a day (BID) | ORAL | Status: DC

## 2012-10-26 NOTE — Progress Notes (Signed)
Pulse-95 No complaints.

## 2012-10-31 ENCOUNTER — Encounter: Payer: Self-pay | Admitting: Obstetrics

## 2012-10-31 ENCOUNTER — Encounter (HOSPITAL_COMMUNITY): Payer: Self-pay

## 2012-10-31 ENCOUNTER — Ambulatory Visit (HOSPITAL_COMMUNITY)
Admission: RE | Admit: 2012-10-31 | Discharge: 2012-10-31 | Disposition: A | Discharge status: Home / Self Care | Payer: Medicaid Other | Source: Ambulatory Visit | Attending: Obstetrics | Admitting: Obstetrics

## 2012-10-31 VITALS — BP 138/81 | HR 117 | Wt 243.5 lb

## 2012-10-31 DIAGNOSIS — O24419 Gestational diabetes mellitus in pregnancy, unspecified control: Secondary | ICD-10-CM

## 2012-10-31 DIAGNOSIS — O337XX0 Maternal care for disproportion due to other fetal deformities, not applicable or unspecified: Secondary | ICD-10-CM | POA: Insufficient documentation

## 2012-10-31 DIAGNOSIS — O09529 Supervision of elderly multigravida, unspecified trimester: Secondary | ICD-10-CM | POA: Insufficient documentation

## 2012-10-31 DIAGNOSIS — O09522 Supervision of elderly multigravida, second trimester: Secondary | ICD-10-CM

## 2012-10-31 DIAGNOSIS — O352XX0 Maternal care for (suspected) hereditary disease in fetus, not applicable or unspecified: Secondary | ICD-10-CM | POA: Insufficient documentation

## 2012-10-31 DIAGNOSIS — E669 Obesity, unspecified: Secondary | ICD-10-CM | POA: Insufficient documentation

## 2012-10-31 DIAGNOSIS — O99810 Abnormal glucose complicating pregnancy: Secondary | ICD-10-CM | POA: Insufficient documentation

## 2012-11-01 ENCOUNTER — Other Ambulatory Visit: Payer: Self-pay

## 2012-11-02 ENCOUNTER — Encounter: Payer: Medicaid Other | Attending: Obstetrics | Admitting: *Deleted

## 2012-11-02 ENCOUNTER — Ambulatory Visit (HOSPITAL_COMMUNITY)
Admission: RE | Admit: 2012-11-02 | Discharge: 2012-11-02 | Disposition: A | Discharge status: Home / Self Care | Payer: Medicaid Other | Source: Ambulatory Visit | Attending: Obstetrics | Admitting: Obstetrics

## 2012-11-02 DIAGNOSIS — O99810 Abnormal glucose complicating pregnancy: Secondary | ICD-10-CM | POA: Insufficient documentation

## 2012-11-02 DIAGNOSIS — Z713 Dietary counseling and surveillance: Secondary | ICD-10-CM | POA: Insufficient documentation

## 2012-11-02 NOTE — ED Notes (Signed)
11-02-2012 Visit with patient today regarding diabetes education blood sugar control. Pt has had basics of carbohydrate counting and cbg target goals. Pt was not aware of carbohydrate portion in grams per meals and snacks. However, she has done very well trying to divide up her grams carbohydrate to total the limit she was given.  Recommended she eat 30 grams for breakfast with instruction for no fruit juice nor fruits until afternoon. She is to have 15 gram snacks between each meal and at HS. She is to have 75-100 grams protein per day as well. Pt and mother working together to adhere to [employer]. She states that when she took Glyburide in the am, she did "crash" (hypoglycemia) one day before lunch. She now only takes 5 mg Glyburide at HS. Her fasting glucose values range from 110-175.  Her glucose at HS is typically higher than it is the next morning fasting.  Explained the hormonal changes and effects on glucose. Explained the physiology of glucose and insulin and the imbalance with GDM. It may be that this pt would benefit from starting with 10 units NPH insulin at HS to control fasting glucose 60-90 mg/dL. Otherwise, may consider increase in Glyburide at HS to 7.5 mg or even 10 mg. I introduced the idea of NPH at bedtime and explained how it would benefit her body and protect the baby from getting too much glucose during the early am hours. Pt is trying and asked appropriate questions. She is having some problems with her Medicaid not covering her supplies and meds.  I gave her the [facility] financial advisor's phone extensions for assistance. I offered her other options than [facility] to get her prescriptions.  Pt ane mother very receptive to all teaching.   Thank you, Lenor Coffin, RN, Diabetes CNS [phone])

## 2012-11-03 ENCOUNTER — Encounter: Payer: Self-pay | Admitting: Obstetrics

## 2012-11-04 ENCOUNTER — Other Ambulatory Visit: Payer: Self-pay

## 2012-11-06 ENCOUNTER — Encounter: Payer: Self-pay | Admitting: Obstetrics

## 2012-11-06 ENCOUNTER — Telehealth (HOSPITAL_COMMUNITY): Payer: Self-pay | Admitting: *Deleted

## 2012-11-06 ENCOUNTER — Other Ambulatory Visit: Payer: Self-pay

## 2012-11-06 ENCOUNTER — Ambulatory Visit (INDEPENDENT_AMBULATORY_CARE_PROVIDER_SITE_OTHER): Payer: Medicaid Other | Admitting: Obstetrics

## 2012-11-06 VITALS — BP 105/71 | Temp 98.8°F | Wt 242.0 lb

## 2012-11-06 DIAGNOSIS — Z3483 Encounter for supervision of other normal pregnancy, third trimester: Secondary | ICD-10-CM

## 2012-11-06 DIAGNOSIS — Z3480 Encounter for supervision of other normal pregnancy, unspecified trimester: Secondary | ICD-10-CM

## 2012-11-06 DIAGNOSIS — F329 Major depressive disorder, single episode, unspecified: Secondary | ICD-10-CM

## 2012-11-06 DIAGNOSIS — F3289 Other specified depressive episodes: Secondary | ICD-10-CM

## 2012-11-06 LAB — POCT URINALYSIS DIPSTICK
Bilirubin, UA: NEGATIVE
Blood, UA: NEGATIVE
Glucose, UA: NEGATIVE
Ketones, UA: NEGATIVE
Nitrite, UA: NEGATIVE
Spec Grav, UA: >=1.03
Urobilinogen, UA: NEGATIVE
pH, UA: 5

## 2012-11-06 MED ORDER — BUPROPION HCL ER (XL) 150 MG PO TB24: 150.0000 mg | ORAL_TABLET | Freq: Every day | ORAL | Status: DC

## 2012-11-06 NOTE — Progress Notes (Signed)
Pulse 87 patient states she has no concerns today

## 2012-11-06 NOTE — Telephone Encounter (Signed)
Natalie Kim faxed in her blood sugars today.  She had reviewed them with Lenor Coffin, diabetes educator, on last Thursday.  She has an appointment with Dr. Clearance Coots this morning.  Instructed patient to show her blood sugars to Dr. Clearance Coots this morning and have him look over the diabetic educators note.  If he makes any changes to her medications to write those on her blood sugar log that she will fax to me next week.  Pt verbalized understanding.

## 2012-11-13 ENCOUNTER — Telehealth (HOSPITAL_COMMUNITY): Payer: Self-pay | Admitting: *Deleted

## 2012-11-13 NOTE — Telephone Encounter (Signed)
Natalie Kim called back to discuss her blood sugars logs for this week.  After reviewing with Dr. Sherrie George, her Glyburide was increased form 2.5mg  in the am to 5mg  and from 7.5mg  at night to 10mg  at night. Pt verbalized understanding.  She was also scheduled to come in on Thursday at 430 to see ann clark diabetes educator to discuss beginning insulin.  Pt agreeable to appointment time.  See scanned documents for blood sugar logs.

## 2012-11-15 ENCOUNTER — Ambulatory Visit (INDEPENDENT_AMBULATORY_CARE_PROVIDER_SITE_OTHER): Payer: Medicaid Other | Admitting: Obstetrics

## 2012-11-15 ENCOUNTER — Encounter: Payer: Self-pay | Admitting: Obstetrics

## 2012-11-15 VITALS — BP 118/75 | Temp 97.8°F | Wt 242.0 lb

## 2012-11-15 DIAGNOSIS — O99810 Abnormal glucose complicating pregnancy: Secondary | ICD-10-CM | POA: Insufficient documentation

## 2012-11-15 DIAGNOSIS — Z3480 Encounter for supervision of other normal pregnancy, unspecified trimester: Secondary | ICD-10-CM

## 2012-11-15 DIAGNOSIS — Z3483 Encounter for supervision of other normal pregnancy, third trimester: Secondary | ICD-10-CM

## 2012-11-15 DIAGNOSIS — O0990 Supervision of high risk pregnancy, unspecified, unspecified trimester: Secondary | ICD-10-CM

## 2012-11-15 LAB — POCT URINALYSIS DIPSTICK
Blood, UA: NEGATIVE
Glucose, UA: NEGATIVE
Nitrite, UA: NEGATIVE
Spec Grav, UA: 1.02
pH, UA: 6

## 2012-11-15 NOTE — Progress Notes (Signed)
Pulse-105 No complaint

## 2012-11-16 ENCOUNTER — Encounter: Payer: Medicaid Other | Attending: Obstetrics | Admitting: *Deleted

## 2012-11-16 ENCOUNTER — Ambulatory Visit (HOSPITAL_COMMUNITY)
Admission: RE | Admit: 2012-11-16 | Discharge: 2012-11-16 | Disposition: A | Discharge status: Home / Self Care | Payer: Medicaid Other | Source: Ambulatory Visit | Attending: Obstetrics & Gynecology | Admitting: Obstetrics & Gynecology

## 2012-11-16 DIAGNOSIS — O99810 Abnormal glucose complicating pregnancy: Secondary | ICD-10-CM | POA: Insufficient documentation

## 2012-11-16 DIAGNOSIS — Z713 Dietary counseling and surveillance: Secondary | ICD-10-CM | POA: Insufficient documentation

## 2012-11-16 NOTE — ED Notes (Signed)
Diabetes Treatment Program Recommendations  ADA Standards of Care 2012 Diabetes in Pregnancy Target Glucose Ranges:  Fasting: 60 - 90 mg/dL Preprandial: 60 - 295 mg/dL 1 hr postprandial: Less than 140mg /dL (from first bite of meal) 2 hr postprandial: Less than 120 mg/dL (from first bit of meal)  Met with patient and mother of pt today.  Glucose results still above target goals even though she states that she has followed the diet guidelines exactly as given and explained.  Taught pt how to use an insulin pen and recommended that pt start on NPH 15 units in the am and 15 units at HS. Pt return demonstrated how to use the pen and expressed understanding. All other questions and concerns addressed. Thank you, Lenor Coffin, RN, Diabetes CNS [phone])

## 2012-11-22 ENCOUNTER — Encounter: Payer: Self-pay | Admitting: Obstetrics

## 2012-11-22 ENCOUNTER — Ambulatory Visit (INDEPENDENT_AMBULATORY_CARE_PROVIDER_SITE_OTHER): Payer: Medicaid Other | Admitting: Obstetrics

## 2012-11-22 VITALS — BP 99/61 | Wt 244.8 lb

## 2012-11-22 DIAGNOSIS — Z3483 Encounter for supervision of other normal pregnancy, third trimester: Secondary | ICD-10-CM

## 2012-11-22 DIAGNOSIS — Z3482 Encounter for supervision of other normal pregnancy, second trimester: Secondary | ICD-10-CM

## 2012-11-22 DIAGNOSIS — Z3480 Encounter for supervision of other normal pregnancy, unspecified trimester: Secondary | ICD-10-CM

## 2012-11-22 LAB — POCT URINALYSIS DIPSTICK
Blood, UA: NEGATIVE
Glucose, UA: NEGATIVE
Ketones, UA: 1
Leukocytes, UA: NEGATIVE
Nitrite, UA: NEGATIVE
Spec Grav, UA: 1.025
pH, UA: 5

## 2012-11-22 NOTE — Progress Notes (Signed)
Pulse: 97

## 2012-11-23 ENCOUNTER — Other Ambulatory Visit: Payer: Self-pay

## 2012-11-23 ENCOUNTER — Encounter (HOSPITAL_COMMUNITY): Payer: Self-pay | Admitting: *Deleted

## 2012-11-23 ENCOUNTER — Telehealth (HOSPITAL_COMMUNITY): Payer: Self-pay | Admitting: *Deleted

## 2012-11-23 NOTE — Telephone Encounter (Signed)
Natalie Kim faxed in her blood sugars today.  Reviewed with Dr. Sherrie George. Will increase NPH from 15 units in the am to 20 units, and will increase from 15 units pm to 20 units.  Pt verbalized understanding.  She will fax in her blood sugars next Thursday.  Instructed pt to call with any concerns or questions.

## 2012-11-27 ENCOUNTER — Other Ambulatory Visit: Payer: Self-pay

## 2012-11-28 ENCOUNTER — Ambulatory Visit (HOSPITAL_COMMUNITY)
Admission: RE | Admit: 2012-11-28 | Discharge: 2012-11-28 | Disposition: A | Discharge status: Home / Self Care | Payer: Medicaid Other | Source: Ambulatory Visit | Attending: Obstetrics | Admitting: Obstetrics

## 2012-11-28 ENCOUNTER — Encounter: Payer: Self-pay | Admitting: Obstetrics & Gynecology

## 2012-11-28 DIAGNOSIS — O09522 Supervision of elderly multigravida, second trimester: Secondary | ICD-10-CM

## 2012-11-28 DIAGNOSIS — O352XX0 Maternal care for (suspected) hereditary disease in fetus, not applicable or unspecified: Secondary | ICD-10-CM | POA: Insufficient documentation

## 2012-11-28 DIAGNOSIS — E669 Obesity, unspecified: Secondary | ICD-10-CM | POA: Insufficient documentation

## 2012-11-28 DIAGNOSIS — O24419 Gestational diabetes mellitus in pregnancy, unspecified control: Secondary | ICD-10-CM

## 2012-11-28 DIAGNOSIS — O337XX0 Maternal care for disproportion due to other fetal deformities, not applicable or unspecified: Secondary | ICD-10-CM | POA: Insufficient documentation

## 2012-11-28 DIAGNOSIS — O99810 Abnormal glucose complicating pregnancy: Secondary | ICD-10-CM | POA: Insufficient documentation

## 2012-11-28 DIAGNOSIS — O09529 Supervision of elderly multigravida, unspecified trimester: Secondary | ICD-10-CM | POA: Insufficient documentation

## 2012-11-28 LAB — US OB DETAIL + 14 WK

## 2012-11-28 NOTE — Progress Notes (Signed)
BSs reviewed Fastings between 100 and 132 2 hr pps all > 120 except for after lunch  Currently on Humulin N 20 units in AM and 20 units in PM; changed to Humulin N 25 units at HS; keep AM dose at 20 units; add Humalog 6 units before breakfast and dinner.  Patient to call BSs in this Friday afternoon.

## 2012-11-29 ENCOUNTER — Telehealth (HOSPITAL_COMMUNITY): Payer: Self-pay | Admitting: *Deleted

## 2012-11-29 NOTE — Telephone Encounter (Signed)
Pharmacy sent in request for prior authorization.  Called for prior authorization and will take 24 hours to find out if it is authorized.  Discussed with Dr. Sherrie George about potential for prescription to not be authorized.  Decided to call in prescription for novolog 6 units before breakfast and 6 units before dinner.  Called Natalie Kim to discuss this new prescription.  She verbalized understanding.  Pt seems hesitant about pulling up the insulin herself.  Arranged for patient to come in tomorrow afternoon to show her proper medication administration.

## 2012-11-30 ENCOUNTER — Other Ambulatory Visit: Payer: Self-pay

## 2012-12-05 ENCOUNTER — Other Ambulatory Visit: Payer: Self-pay | Admitting: Obstetrics

## 2012-12-05 ENCOUNTER — Telehealth (HOSPITAL_COMMUNITY): Payer: Self-pay | Admitting: *Deleted

## 2012-12-05 DIAGNOSIS — E119 Type 2 diabetes mellitus without complications: Secondary | ICD-10-CM

## 2012-12-05 DIAGNOSIS — O09523 Supervision of elderly multigravida, third trimester: Secondary | ICD-10-CM

## 2012-12-05 DIAGNOSIS — O24913 Unspecified diabetes mellitus in pregnancy, third trimester: Secondary | ICD-10-CM

## 2012-12-05 NOTE — Telephone Encounter (Signed)
Natalie Kim faxed in her blood sugars today.  Reviewed with Dr. Claudean Severance.  Will add 6 units novolog before lunch. Keep humulin N at 20 units am and 25 units pm and novolog 6 units with both breakfast and dinner.  Pt verbalized understanding.  Pt was also set up for twice weekly testing as she will be 32 weeks next week.  appts made and pt notified.

## 2012-12-06 ENCOUNTER — Ambulatory Visit (INDEPENDENT_AMBULATORY_CARE_PROVIDER_SITE_OTHER): Payer: Medicaid Other | Admitting: Obstetrics

## 2012-12-06 ENCOUNTER — Encounter: Payer: Self-pay | Admitting: Obstetrics

## 2012-12-06 VITALS — BP 114/79 | Temp 98.0°F | Wt 243.0 lb

## 2012-12-06 DIAGNOSIS — Z3480 Encounter for supervision of other normal pregnancy, unspecified trimester: Secondary | ICD-10-CM

## 2012-12-06 DIAGNOSIS — Z3483 Encounter for supervision of other normal pregnancy, third trimester: Secondary | ICD-10-CM

## 2012-12-06 LAB — POCT URINALYSIS DIPSTICK
Bilirubin, UA: NEGATIVE
Blood, UA: NEGATIVE
Glucose, UA: NEGATIVE
Leukocytes, UA: NEGATIVE
Nitrite, UA: NEGATIVE
Spec Grav, UA: >=1.03
Urobilinogen, UA: NEGATIVE
pH, UA: 5

## 2012-12-06 NOTE — Progress Notes (Signed)
p=96 

## 2012-12-11 ENCOUNTER — Ambulatory Visit (HOSPITAL_COMMUNITY)
Admission: RE | Admit: 2012-12-11 | Discharge: 2012-12-11 | Disposition: A | Discharge status: Home / Self Care | Payer: Medicaid Other | Source: Ambulatory Visit | Attending: Obstetrics | Admitting: Obstetrics

## 2012-12-11 ENCOUNTER — Other Ambulatory Visit: Payer: Self-pay

## 2012-12-11 ENCOUNTER — Encounter: Payer: Self-pay | Admitting: Obstetrics

## 2012-12-11 DIAGNOSIS — E669 Obesity, unspecified: Secondary | ICD-10-CM | POA: Insufficient documentation

## 2012-12-11 DIAGNOSIS — O99210 Obesity complicating pregnancy, unspecified trimester: Secondary | ICD-10-CM | POA: Insufficient documentation

## 2012-12-11 DIAGNOSIS — O09529 Supervision of elderly multigravida, unspecified trimester: Secondary | ICD-10-CM | POA: Insufficient documentation

## 2012-12-11 DIAGNOSIS — O352XX0 Maternal care for (suspected) hereditary disease in fetus, not applicable or unspecified: Secondary | ICD-10-CM | POA: Insufficient documentation

## 2012-12-11 DIAGNOSIS — E119 Type 2 diabetes mellitus without complications: Secondary | ICD-10-CM

## 2012-12-11 DIAGNOSIS — O99810 Abnormal glucose complicating pregnancy: Secondary | ICD-10-CM | POA: Insufficient documentation

## 2012-12-11 DIAGNOSIS — O09523 Supervision of elderly multigravida, third trimester: Secondary | ICD-10-CM

## 2012-12-11 DIAGNOSIS — O337XX0 Maternal care for disproportion due to other fetal deformities, not applicable or unspecified: Secondary | ICD-10-CM | POA: Insufficient documentation

## 2012-12-11 LAB — US OB DETAIL + 14 WK

## 2012-12-11 NOTE — Consult Note (Signed)
MFM Consultation  Natalie Kim was seen today for an MFM consultation appointment.   Impressions:  Single IUP A2 GDM on insulin- inadequate control    Patient is currently on NPH 20/25 and Novolog 6/6/6. Glycemic measures are still elevated.  Recommendations:  I counseled her on diet and adjusted NPH to 22/28.  Maintain Novolog at 6/6/6 with meals. initiate 2x weekly NSTs with weekly amniotic fluid assessment.  Recommend delivery at 39 weeks due to A2 GDM.   Time Spent:  I spent in excess of 15 minutes in consultation with this patient to review records, evaluate her case, and provide her with an adequate discussion and education. More than 50% of this time was spent in direct face-to-face counseling.  It was a pleasure seeing your patient in the office today. Thank you for consultation. Please do not hesitate to contact our service for any further questions.   Louann Sjogren Natalie Kim, Louann Sjogren, MD, MS, FACOG  Assistant Professor  Section of Maternal-Fetal Medicine  [facility]

## 2012-12-12 ENCOUNTER — Telehealth (HOSPITAL_COMMUNITY): Payer: Self-pay | Admitting: *Deleted

## 2012-12-12 NOTE — Telephone Encounter (Signed)
Delaine called for a refill of her Humulin N pen and needles.  Prescription called in to [facility] on [address] for 20 units am and 25 units pm.  Pt notified that prescription has been called in.

## 2012-12-14 ENCOUNTER — Other Ambulatory Visit: Payer: Self-pay | Admitting: Obstetrics

## 2012-12-14 ENCOUNTER — Ambulatory Visit (HOSPITAL_COMMUNITY)
Admission: RE | Admit: 2012-12-14 | Discharge: 2012-12-14 | Disposition: A | Discharge status: Home / Self Care | Payer: Medicaid Other | Source: Ambulatory Visit | Attending: Obstetrics | Admitting: Obstetrics

## 2012-12-14 DIAGNOSIS — O99810 Abnormal glucose complicating pregnancy: Secondary | ICD-10-CM | POA: Insufficient documentation

## 2012-12-14 DIAGNOSIS — O09529 Supervision of elderly multigravida, unspecified trimester: Secondary | ICD-10-CM | POA: Insufficient documentation

## 2012-12-14 DIAGNOSIS — E669 Obesity, unspecified: Secondary | ICD-10-CM | POA: Insufficient documentation

## 2012-12-14 DIAGNOSIS — O24419 Gestational diabetes mellitus in pregnancy, unspecified control: Secondary | ICD-10-CM

## 2012-12-14 DIAGNOSIS — O352XX0 Maternal care for (suspected) hereditary disease in fetus, not applicable or unspecified: Secondary | ICD-10-CM | POA: Insufficient documentation

## 2012-12-14 DIAGNOSIS — O337XX0 Maternal care for disproportion due to other fetal deformities, not applicable or unspecified: Secondary | ICD-10-CM | POA: Insufficient documentation

## 2012-12-18 ENCOUNTER — Encounter: Payer: Self-pay | Admitting: Obstetrics & Gynecology

## 2012-12-18 ENCOUNTER — Ambulatory Visit (HOSPITAL_COMMUNITY): Admission: RE | Admit: 2012-12-18 | Payer: Medicaid Other | Source: Ambulatory Visit

## 2012-12-18 ENCOUNTER — Telehealth (HOSPITAL_COMMUNITY): Payer: Self-pay | Admitting: *Deleted

## 2012-12-18 ENCOUNTER — Ambulatory Visit (HOSPITAL_COMMUNITY)
Admission: RE | Admit: 2012-12-18 | Discharge: 2012-12-18 | Disposition: A | Discharge status: Home / Self Care | Payer: Medicaid Other | Source: Ambulatory Visit | Attending: Obstetrics | Admitting: Obstetrics

## 2012-12-18 ENCOUNTER — Other Ambulatory Visit: Payer: Self-pay | Admitting: Obstetrics

## 2012-12-18 VITALS — BP 113/69 | HR 97 | Wt 242.0 lb

## 2012-12-18 DIAGNOSIS — O337XX0 Maternal care for disproportion due to other fetal deformities, not applicable or unspecified: Secondary | ICD-10-CM | POA: Insufficient documentation

## 2012-12-18 DIAGNOSIS — O99810 Abnormal glucose complicating pregnancy: Secondary | ICD-10-CM | POA: Insufficient documentation

## 2012-12-18 DIAGNOSIS — O24419 Gestational diabetes mellitus in pregnancy, unspecified control: Secondary | ICD-10-CM

## 2012-12-18 DIAGNOSIS — O09529 Supervision of elderly multigravida, unspecified trimester: Secondary | ICD-10-CM

## 2012-12-18 DIAGNOSIS — O352XX0 Maternal care for (suspected) hereditary disease in fetus, not applicable or unspecified: Secondary | ICD-10-CM | POA: Insufficient documentation

## 2012-12-18 DIAGNOSIS — E669 Obesity, unspecified: Secondary | ICD-10-CM | POA: Insufficient documentation

## 2012-12-18 DIAGNOSIS — O99210 Obesity complicating pregnancy, unspecified trimester: Secondary | ICD-10-CM | POA: Insufficient documentation

## 2012-12-18 LAB — US OB DETAIL + 14 WK

## 2012-12-18 NOTE — ED Notes (Signed)
Pt did not bring blood sugar logs.  Left them at work.  Will fax over when she gets there.

## 2012-12-18 NOTE — Progress Notes (Signed)
[name]  was seen today for an ultrasound appointment.  See full report in [facility].  Impression: Single IUP at 33 6/7 weeks Follow up due to A2 GDM on insulin, CHTN Active fetus with BPP of 8/8 Mild polyhydramnios (AFI 25.9 cm)  Ms. Virrueta will call in her fingerstick sugar results later today for review  Recommendations: Continue 2x weekly NSTs with weekly AFIs Follow up growth scan next week  Recommend delivery at 39 weeks if testing is otherwise reassuring.  Alpha Gula, MD

## 2012-12-18 NOTE — Telephone Encounter (Signed)
Nonie faxed in her blood sugars.  Reviewed with Dr. Claudean Severance.   No changes made.  Pt has appt on Thursday.

## 2012-12-20 ENCOUNTER — Ambulatory Visit (INDEPENDENT_AMBULATORY_CARE_PROVIDER_SITE_OTHER): Payer: Medicaid Other | Admitting: Obstetrics

## 2012-12-20 VITALS — BP 110/79 | Temp 98.8°F | Wt 242.0 lb

## 2012-12-20 DIAGNOSIS — Z3483 Encounter for supervision of other normal pregnancy, third trimester: Secondary | ICD-10-CM

## 2012-12-20 DIAGNOSIS — Z3480 Encounter for supervision of other normal pregnancy, unspecified trimester: Secondary | ICD-10-CM

## 2012-12-20 LAB — POCT URINALYSIS DIPSTICK
Bilirubin, UA: NEGATIVE
Blood, UA: NEGATIVE
Glucose, UA: NEGATIVE
Ketones, UA: NEGATIVE
Leukocytes, UA: NEGATIVE
Nitrite, UA: NEGATIVE
Spec Grav, UA: 1.015
Urobilinogen, UA: NEGATIVE
pH, UA: 5

## 2012-12-20 NOTE — Progress Notes (Signed)
Pulse: 103

## 2012-12-21 ENCOUNTER — Ambulatory Visit (HOSPITAL_COMMUNITY)
Admission: RE | Admit: 2012-12-21 | Discharge: 2012-12-21 | Disposition: A | Discharge status: Home / Self Care | Payer: Medicaid Other | Source: Ambulatory Visit | Attending: Obstetrics | Admitting: Obstetrics

## 2012-12-21 ENCOUNTER — Encounter: Payer: Self-pay | Admitting: Obstetrics

## 2012-12-21 DIAGNOSIS — O09529 Supervision of elderly multigravida, unspecified trimester: Secondary | ICD-10-CM | POA: Insufficient documentation

## 2012-12-21 DIAGNOSIS — O99810 Abnormal glucose complicating pregnancy: Secondary | ICD-10-CM | POA: Insufficient documentation

## 2012-12-21 NOTE — ED Notes (Signed)
Blood sugars copied and scanned in.  reviewed with Dr. Claudean Severance.  Adjustments made as follows: Humulin N 22 units am, 30 units pm.  Keep 6/6/6 with all meals.

## 2012-12-22 ENCOUNTER — Other Ambulatory Visit: Payer: Self-pay

## 2012-12-25 ENCOUNTER — Ambulatory Visit (HOSPITAL_COMMUNITY)
Admission: RE | Admit: 2012-12-25 | Discharge: 2012-12-25 | Disposition: A | Discharge status: Home / Self Care | Payer: Medicaid Other | Source: Ambulatory Visit | Attending: Obstetrics | Admitting: Obstetrics

## 2012-12-25 ENCOUNTER — Other Ambulatory Visit (HOSPITAL_COMMUNITY): Payer: Self-pay | Admitting: Maternal and Fetal Medicine

## 2012-12-25 ENCOUNTER — Ambulatory Visit (HOSPITAL_COMMUNITY): Payer: Medicaid Other

## 2012-12-25 DIAGNOSIS — O337XX0 Maternal care for disproportion due to other fetal deformities, not applicable or unspecified: Secondary | ICD-10-CM | POA: Insufficient documentation

## 2012-12-25 DIAGNOSIS — O99210 Obesity complicating pregnancy, unspecified trimester: Secondary | ICD-10-CM | POA: Insufficient documentation

## 2012-12-25 DIAGNOSIS — O352XX0 Maternal care for (suspected) hereditary disease in fetus, not applicable or unspecified: Secondary | ICD-10-CM | POA: Insufficient documentation

## 2012-12-25 DIAGNOSIS — O24419 Gestational diabetes mellitus in pregnancy, unspecified control: Secondary | ICD-10-CM

## 2012-12-25 DIAGNOSIS — O409XX0 Polyhydramnios, unspecified trimester, not applicable or unspecified: Secondary | ICD-10-CM | POA: Insufficient documentation

## 2012-12-25 DIAGNOSIS — O99810 Abnormal glucose complicating pregnancy: Secondary | ICD-10-CM

## 2012-12-25 DIAGNOSIS — O09529 Supervision of elderly multigravida, unspecified trimester: Secondary | ICD-10-CM | POA: Insufficient documentation

## 2012-12-25 DIAGNOSIS — E669 Obesity, unspecified: Secondary | ICD-10-CM | POA: Insufficient documentation

## 2012-12-26 ENCOUNTER — Ambulatory Visit (HOSPITAL_COMMUNITY): Payer: Medicaid Other

## 2012-12-28 ENCOUNTER — Ambulatory Visit (HOSPITAL_COMMUNITY)
Admission: RE | Admit: 2012-12-28 | Discharge: 2012-12-28 | Disposition: A | Discharge status: Home / Self Care | Payer: Medicaid Other | Source: Ambulatory Visit | Attending: Obstetrics | Admitting: Obstetrics

## 2012-12-28 DIAGNOSIS — O99810 Abnormal glucose complicating pregnancy: Secondary | ICD-10-CM | POA: Insufficient documentation

## 2012-12-28 DIAGNOSIS — E669 Obesity, unspecified: Secondary | ICD-10-CM | POA: Insufficient documentation

## 2012-12-28 DIAGNOSIS — O352XX0 Maternal care for (suspected) hereditary disease in fetus, not applicable or unspecified: Secondary | ICD-10-CM | POA: Insufficient documentation

## 2012-12-28 DIAGNOSIS — O337XX0 Maternal care for disproportion due to other fetal deformities, not applicable or unspecified: Secondary | ICD-10-CM | POA: Insufficient documentation

## 2012-12-28 DIAGNOSIS — O09529 Supervision of elderly multigravida, unspecified trimester: Secondary | ICD-10-CM | POA: Insufficient documentation

## 2012-12-28 NOTE — Progress Notes (Signed)
BSs reviewed; fastings still high; increased hs Humulin N to 40 units Current insulin dosing: Hum N 22/40; Novolog 8/8/8

## 2012-12-29 ENCOUNTER — Other Ambulatory Visit: Payer: Self-pay | Admitting: Obstetrics

## 2012-12-29 DIAGNOSIS — O24419 Gestational diabetes mellitus in pregnancy, unspecified control: Secondary | ICD-10-CM

## 2012-12-29 DIAGNOSIS — O09529 Supervision of elderly multigravida, unspecified trimester: Secondary | ICD-10-CM

## 2013-01-01 ENCOUNTER — Other Ambulatory Visit: Payer: Self-pay

## 2013-01-01 ENCOUNTER — Ambulatory Visit (HOSPITAL_COMMUNITY)
Admission: RE | Admit: 2013-01-01 | Discharge: 2013-01-01 | Disposition: A | Discharge status: Home / Self Care | Payer: Medicaid Other | Source: Ambulatory Visit | Attending: Obstetrics | Admitting: Obstetrics

## 2013-01-01 DIAGNOSIS — O09529 Supervision of elderly multigravida, unspecified trimester: Secondary | ICD-10-CM | POA: Insufficient documentation

## 2013-01-01 DIAGNOSIS — O99810 Abnormal glucose complicating pregnancy: Secondary | ICD-10-CM | POA: Insufficient documentation

## 2013-01-01 DIAGNOSIS — O352XX0 Maternal care for (suspected) hereditary disease in fetus, not applicable or unspecified: Secondary | ICD-10-CM | POA: Insufficient documentation

## 2013-01-01 DIAGNOSIS — E669 Obesity, unspecified: Secondary | ICD-10-CM | POA: Insufficient documentation

## 2013-01-01 DIAGNOSIS — O337XX0 Maternal care for disproportion due to other fetal deformities, not applicable or unspecified: Secondary | ICD-10-CM | POA: Insufficient documentation

## 2013-01-01 DIAGNOSIS — O24419 Gestational diabetes mellitus in pregnancy, unspecified control: Secondary | ICD-10-CM

## 2013-01-03 ENCOUNTER — Encounter: Payer: Self-pay | Admitting: Obstetrics

## 2013-01-03 ENCOUNTER — Ambulatory Visit (INDEPENDENT_AMBULATORY_CARE_PROVIDER_SITE_OTHER): Payer: Medicaid Other | Admitting: Obstetrics

## 2013-01-03 VITALS — BP 119/76 | Temp 98.6°F | Wt 245.2 lb

## 2013-01-03 DIAGNOSIS — Z3480 Encounter for supervision of other normal pregnancy, unspecified trimester: Secondary | ICD-10-CM

## 2013-01-03 DIAGNOSIS — Z3483 Encounter for supervision of other normal pregnancy, third trimester: Secondary | ICD-10-CM

## 2013-01-03 LAB — POCT URINALYSIS DIPSTICK
Bilirubin, UA: NEGATIVE
Blood, UA: NEGATIVE
Glucose, UA: NEGATIVE
Ketones, UA: NEGATIVE
Nitrite, UA: NEGATIVE
Protein, UA: NEGATIVE
Spec Grav, UA: 1.02
Urobilinogen, UA: NEGATIVE
pH, UA: 5

## 2013-01-03 NOTE — Progress Notes (Signed)
Pulse: 85

## 2013-01-04 ENCOUNTER — Ambulatory Visit (HOSPITAL_COMMUNITY)
Admission: RE | Admit: 2013-01-04 | Discharge: 2013-01-04 | Disposition: A | Discharge status: Home / Self Care | Payer: Medicaid Other | Source: Ambulatory Visit | Attending: Obstetrics | Admitting: Obstetrics

## 2013-01-04 DIAGNOSIS — O09529 Supervision of elderly multigravida, unspecified trimester: Secondary | ICD-10-CM | POA: Insufficient documentation

## 2013-01-04 DIAGNOSIS — O352XX0 Maternal care for (suspected) hereditary disease in fetus, not applicable or unspecified: Secondary | ICD-10-CM | POA: Insufficient documentation

## 2013-01-04 DIAGNOSIS — E669 Obesity, unspecified: Secondary | ICD-10-CM | POA: Insufficient documentation

## 2013-01-04 DIAGNOSIS — O99810 Abnormal glucose complicating pregnancy: Secondary | ICD-10-CM | POA: Insufficient documentation

## 2013-01-04 DIAGNOSIS — O337XX0 Maternal care for disproportion due to other fetal deformities, not applicable or unspecified: Secondary | ICD-10-CM | POA: Insufficient documentation

## 2013-01-04 NOTE — ED Notes (Signed)
Blood sugars reviewed with Dr. Vincenza Hews.  Will increase pm humulin to 44units.

## 2013-01-05 ENCOUNTER — Other Ambulatory Visit: Payer: Self-pay | Admitting: Obstetrics

## 2013-01-05 DIAGNOSIS — O24419 Gestational diabetes mellitus in pregnancy, unspecified control: Secondary | ICD-10-CM

## 2013-01-05 DIAGNOSIS — O09529 Supervision of elderly multigravida, unspecified trimester: Secondary | ICD-10-CM

## 2013-01-06 LAB — CULTURE, BETA STREP (GROUP B ONLY)

## 2013-01-08 ENCOUNTER — Ambulatory Visit (HOSPITAL_COMMUNITY)
Admission: RE | Admit: 2013-01-08 | Discharge: 2013-01-08 | Disposition: A | Discharge status: Home / Self Care | Payer: Medicaid Other | Source: Ambulatory Visit | Attending: Obstetrics | Admitting: Obstetrics

## 2013-01-08 ENCOUNTER — Other Ambulatory Visit: Payer: Self-pay

## 2013-01-08 DIAGNOSIS — O24419 Gestational diabetes mellitus in pregnancy, unspecified control: Secondary | ICD-10-CM

## 2013-01-08 DIAGNOSIS — E669 Obesity, unspecified: Secondary | ICD-10-CM | POA: Insufficient documentation

## 2013-01-08 DIAGNOSIS — O99810 Abnormal glucose complicating pregnancy: Secondary | ICD-10-CM | POA: Insufficient documentation

## 2013-01-08 DIAGNOSIS — O337XX0 Maternal care for disproportion due to other fetal deformities, not applicable or unspecified: Secondary | ICD-10-CM | POA: Insufficient documentation

## 2013-01-08 DIAGNOSIS — O352XX0 Maternal care for (suspected) hereditary disease in fetus, not applicable or unspecified: Secondary | ICD-10-CM | POA: Insufficient documentation

## 2013-01-08 DIAGNOSIS — O09529 Supervision of elderly multigravida, unspecified trimester: Secondary | ICD-10-CM | POA: Insufficient documentation

## 2013-01-08 NOTE — Progress Notes (Signed)
Natalie Kim  was seen today for an ultrasound appointment.  See full report in [facility].  Impression: Single IUP at 36 1/7 weeks Limited ultrasound performed for amniotic fluid assessment Reacitve NST; AFI 19.5 cm Normal modified biophysical profile  Recommendations: Continue twice weekly NSTs with weekly AFIs   Recommend delivery at 39 weeks  Alpha Gula, MD

## 2013-01-08 NOTE — ED Notes (Signed)
Refill prescription for Humulin N pen 22 units am, 44 units pm, and Novolog 8/8/8 with syringes called in to [facility] on [address].

## 2013-01-09 ENCOUNTER — Telehealth (HOSPITAL_COMMUNITY): Payer: Self-pay | Admitting: *Deleted

## 2013-01-09 NOTE — Telephone Encounter (Signed)
Arelly called to say that her prescription for humulin N pen was no longer available at the pharmacy.  I called the pharmacy and they are switching to the [facility] which should be available tomorrow.  Raymonde stated she had enough left of there humulin n pen to get through the next day and a half.  The pharmacy will call her and let her know if they have any at other pharmacies, or let her know exactly when those are expected to arrive.   Informed patient to call back in the morning if they have not contacted her by then.

## 2013-01-10 ENCOUNTER — Ambulatory Visit (INDEPENDENT_AMBULATORY_CARE_PROVIDER_SITE_OTHER): Payer: Medicaid Other | Admitting: Obstetrics

## 2013-01-10 VITALS — BP 122/78 | Temp 97.8°F | Wt 248.0 lb

## 2013-01-10 DIAGNOSIS — Z3483 Encounter for supervision of other normal pregnancy, third trimester: Secondary | ICD-10-CM

## 2013-01-10 DIAGNOSIS — Z3480 Encounter for supervision of other normal pregnancy, unspecified trimester: Secondary | ICD-10-CM

## 2013-01-10 NOTE — Progress Notes (Signed)
P- 100 Round Ligament pain and lower back pain

## 2013-01-11 ENCOUNTER — Other Ambulatory Visit: Payer: Self-pay | Admitting: Obstetrics

## 2013-01-11 ENCOUNTER — Ambulatory Visit (HOSPITAL_COMMUNITY)
Admission: RE | Admit: 2013-01-11 | Discharge: 2013-01-11 | Disposition: A | Discharge status: Home / Self Care | Payer: Medicaid Other | Source: Ambulatory Visit | Attending: Obstetrics | Admitting: Obstetrics

## 2013-01-11 DIAGNOSIS — O352XX0 Maternal care for (suspected) hereditary disease in fetus, not applicable or unspecified: Secondary | ICD-10-CM | POA: Insufficient documentation

## 2013-01-11 DIAGNOSIS — O337XX0 Maternal care for disproportion due to other fetal deformities, not applicable or unspecified: Secondary | ICD-10-CM | POA: Insufficient documentation

## 2013-01-11 DIAGNOSIS — O24419 Gestational diabetes mellitus in pregnancy, unspecified control: Secondary | ICD-10-CM

## 2013-01-11 DIAGNOSIS — O09529 Supervision of elderly multigravida, unspecified trimester: Secondary | ICD-10-CM

## 2013-01-11 DIAGNOSIS — E669 Obesity, unspecified: Secondary | ICD-10-CM | POA: Insufficient documentation

## 2013-01-11 DIAGNOSIS — O99810 Abnormal glucose complicating pregnancy: Secondary | ICD-10-CM | POA: Insufficient documentation

## 2013-01-11 LAB — POCT URINALYSIS DIPSTICK
Bilirubin, UA: NEGATIVE
Blood, UA: NEGATIVE
Glucose, UA: NEGATIVE
Ketones, UA: NEGATIVE
Leukocytes, UA: NEGATIVE
Nitrite, UA: NEGATIVE
Spec Grav, UA: <=1.005
Urobilinogen, UA: NEGATIVE
pH, UA: 6

## 2013-01-11 NOTE — ED Notes (Signed)
Pt did not bring blood sugars today.  States fastings have all been below 95.  Will bring sugars next Tuesday.

## 2013-01-16 ENCOUNTER — Ambulatory Visit (HOSPITAL_COMMUNITY)
Admission: RE | Admit: 2013-01-16 | Discharge: 2013-01-16 | Disposition: A | Discharge status: Home / Self Care | Payer: Medicaid Other | Source: Ambulatory Visit | Attending: Obstetrics | Admitting: Obstetrics

## 2013-01-16 ENCOUNTER — Encounter (HOSPITAL_COMMUNITY): Payer: Self-pay

## 2013-01-16 DIAGNOSIS — O24419 Gestational diabetes mellitus in pregnancy, unspecified control: Secondary | ICD-10-CM

## 2013-01-16 DIAGNOSIS — O337XX0 Maternal care for disproportion due to other fetal deformities, not applicable or unspecified: Secondary | ICD-10-CM | POA: Insufficient documentation

## 2013-01-16 DIAGNOSIS — O09529 Supervision of elderly multigravida, unspecified trimester: Secondary | ICD-10-CM

## 2013-01-16 DIAGNOSIS — O352XX0 Maternal care for (suspected) hereditary disease in fetus, not applicable or unspecified: Secondary | ICD-10-CM | POA: Insufficient documentation

## 2013-01-16 DIAGNOSIS — O99810 Abnormal glucose complicating pregnancy: Secondary | ICD-10-CM | POA: Insufficient documentation

## 2013-01-16 DIAGNOSIS — E669 Obesity, unspecified: Secondary | ICD-10-CM | POA: Insufficient documentation

## 2013-01-16 NOTE — ED Notes (Signed)
Patient brings her glucose log. This was reviewed by Dr. Otho Perl and no changes made.

## 2013-01-17 ENCOUNTER — Encounter: Payer: Self-pay | Admitting: *Deleted

## 2013-01-17 ENCOUNTER — Ambulatory Visit (INDEPENDENT_AMBULATORY_CARE_PROVIDER_SITE_OTHER): Payer: Medicaid Other | Admitting: Obstetrics

## 2013-01-17 VITALS — BP 128/90 | Temp 98.4°F | Wt 248.0 lb

## 2013-01-17 DIAGNOSIS — Z3480 Encounter for supervision of other normal pregnancy, unspecified trimester: Secondary | ICD-10-CM

## 2013-01-17 DIAGNOSIS — Z3483 Encounter for supervision of other normal pregnancy, third trimester: Secondary | ICD-10-CM

## 2013-01-17 LAB — POCT URINALYSIS DIPSTICK
Bilirubin, UA: NEGATIVE
Blood, UA: NEGATIVE
Glucose, UA: NEGATIVE
Ketones, UA: NEGATIVE
Leukocytes, UA: NEGATIVE
Nitrite, UA: NEGATIVE
Spec Grav, UA: 1.02
Urobilinogen, UA: NEGATIVE
pH, UA: 5

## 2013-01-17 NOTE — Progress Notes (Signed)
Pulse- 101 

## 2013-01-18 ENCOUNTER — Ambulatory Visit (HOSPITAL_COMMUNITY): Payer: Medicaid Other

## 2013-01-18 ENCOUNTER — Other Ambulatory Visit: Payer: Self-pay | Admitting: Obstetrics

## 2013-01-18 ENCOUNTER — Encounter: Payer: Self-pay | Admitting: Obstetrics

## 2013-01-18 DIAGNOSIS — O24419 Gestational diabetes mellitus in pregnancy, unspecified control: Secondary | ICD-10-CM

## 2013-01-18 DIAGNOSIS — O09529 Supervision of elderly multigravida, unspecified trimester: Secondary | ICD-10-CM

## 2013-01-19 ENCOUNTER — Ambulatory Visit (HOSPITAL_COMMUNITY)
Admission: RE | Admit: 2013-01-19 | Discharge: 2013-01-19 | Disposition: A | Discharge status: Home / Self Care | Payer: Medicaid Other | Source: Ambulatory Visit | Attending: Obstetrics | Admitting: Obstetrics

## 2013-01-19 ENCOUNTER — Encounter (HOSPITAL_COMMUNITY): Payer: Self-pay

## 2013-01-19 DIAGNOSIS — O352XX0 Maternal care for (suspected) hereditary disease in fetus, not applicable or unspecified: Secondary | ICD-10-CM | POA: Insufficient documentation

## 2013-01-19 DIAGNOSIS — O337XX0 Maternal care for disproportion due to other fetal deformities, not applicable or unspecified: Secondary | ICD-10-CM | POA: Insufficient documentation

## 2013-01-19 DIAGNOSIS — O09529 Supervision of elderly multigravida, unspecified trimester: Secondary | ICD-10-CM | POA: Insufficient documentation

## 2013-01-19 DIAGNOSIS — O99810 Abnormal glucose complicating pregnancy: Secondary | ICD-10-CM | POA: Insufficient documentation

## 2013-01-19 DIAGNOSIS — E669 Obesity, unspecified: Secondary | ICD-10-CM | POA: Insufficient documentation

## 2013-01-22 ENCOUNTER — Other Ambulatory Visit: Payer: Self-pay | Admitting: Obstetrics

## 2013-01-22 ENCOUNTER — Ambulatory Visit (HOSPITAL_COMMUNITY): Admission: RE | Admit: 2013-01-22 | Payer: Medicaid Other | Source: Ambulatory Visit

## 2013-01-22 ENCOUNTER — Ambulatory Visit (HOSPITAL_COMMUNITY)
Admission: RE | Admit: 2013-01-22 | Discharge: 2013-01-22 | Disposition: A | Discharge status: Home / Self Care | Payer: Medicaid Other | Source: Ambulatory Visit | Attending: Obstetrics | Admitting: Obstetrics

## 2013-01-22 DIAGNOSIS — O09529 Supervision of elderly multigravida, unspecified trimester: Secondary | ICD-10-CM | POA: Insufficient documentation

## 2013-01-22 DIAGNOSIS — O337XX0 Maternal care for disproportion due to other fetal deformities, not applicable or unspecified: Secondary | ICD-10-CM | POA: Insufficient documentation

## 2013-01-22 DIAGNOSIS — O352XX0 Maternal care for (suspected) hereditary disease in fetus, not applicable or unspecified: Secondary | ICD-10-CM | POA: Insufficient documentation

## 2013-01-22 DIAGNOSIS — E669 Obesity, unspecified: Secondary | ICD-10-CM | POA: Insufficient documentation

## 2013-01-22 DIAGNOSIS — O24419 Gestational diabetes mellitus in pregnancy, unspecified control: Secondary | ICD-10-CM

## 2013-01-22 DIAGNOSIS — O99810 Abnormal glucose complicating pregnancy: Secondary | ICD-10-CM | POA: Insufficient documentation

## 2013-01-22 NOTE — Progress Notes (Signed)
[facility]  Indication: 42 yr old G2P1001 at [redacted]w[redacted]d with chronic hypertension, gestational diabetes, and obesity for follow up fetal ultrasound and BPP.  Findings: 1. Single intrauterine pregnancy. 2. Estimated fetal weight is in the >90th%. 3. Anterior placenta without evidence of previa. 4. Normal amniotic fluid index. 5. The limited anatomy survey is normal.  6. Normal biophysical profile of 8/8. 7. Fetus is in breech presentation.  Recommendations: 1. Fetal macrosomia: - discussed increased risks of shoulder dystocia and need for C section (at this point fetus is breech so would need C section- declines version) - at this time fetus not projected to be >4500g at delivery so if becomes vertex may offer trial of labor; recommend strict adherence to the labor curve and low threshold for C section 2. Gestational diabetes: - previously counseled - blood sugars reviewed and insulin adjusted - contineu antenatal testing - recommend delivery as below 3.  Advanced maternal age: - previously counseled - patient declined aneuploidy screening - recommend antenatal testing and delivery as below 4. Chronic  hypertension: - previously counseled - currently well controlled off of medication - recommend continue antenatal testing - recommend delivery by estimated due date but not prior to 38 weeks in the absence of other complications 5. Obesity: - previously counseled   Eulis Foster, MD

## 2013-01-24 ENCOUNTER — Encounter: Payer: Self-pay | Admitting: Obstetrics

## 2013-01-24 ENCOUNTER — Ambulatory Visit (INDEPENDENT_AMBULATORY_CARE_PROVIDER_SITE_OTHER): Payer: Medicaid Other | Admitting: Obstetrics

## 2013-01-24 VITALS — BP 135/93 | Temp 97.1°F | Wt 247.0 lb

## 2013-01-24 DIAGNOSIS — O0990 Supervision of high risk pregnancy, unspecified, unspecified trimester: Secondary | ICD-10-CM

## 2013-01-24 DIAGNOSIS — Z3480 Encounter for supervision of other normal pregnancy, unspecified trimester: Secondary | ICD-10-CM

## 2013-01-24 DIAGNOSIS — O09529 Supervision of elderly multigravida, unspecified trimester: Secondary | ICD-10-CM

## 2013-01-24 DIAGNOSIS — O09523 Supervision of elderly multigravida, third trimester: Secondary | ICD-10-CM

## 2013-01-24 DIAGNOSIS — Z3483 Encounter for supervision of other normal pregnancy, third trimester: Secondary | ICD-10-CM

## 2013-01-24 LAB — POCT URINALYSIS DIPSTICK
Bilirubin, UA: NEGATIVE
Blood, UA: NEGATIVE
Glucose, UA: NEGATIVE
Ketones, UA: NEGATIVE
Leukocytes, UA: NEGATIVE
Nitrite, UA: NEGATIVE
Protein, UA: NEGATIVE
Spec Grav, UA: 1.02
Urobilinogen, UA: NEGATIVE
pH, UA: 5

## 2013-01-24 NOTE — Progress Notes (Signed)
P- 92 Pressure in lower abdomen

## 2013-01-25 ENCOUNTER — Ambulatory Visit (HOSPITAL_COMMUNITY)
Admission: RE | Admit: 2013-01-25 | Discharge: 2013-01-25 | Disposition: A | Discharge status: Home / Self Care | Payer: Medicaid Other | Source: Ambulatory Visit | Attending: Obstetrics | Admitting: Obstetrics

## 2013-01-25 ENCOUNTER — Encounter: Payer: Self-pay | Admitting: Obstetrics

## 2013-01-25 ENCOUNTER — Other Ambulatory Visit: Payer: Self-pay | Admitting: *Deleted

## 2013-01-25 DIAGNOSIS — O337XX0 Maternal care for disproportion due to other fetal deformities, not applicable or unspecified: Secondary | ICD-10-CM | POA: Insufficient documentation

## 2013-01-25 DIAGNOSIS — E669 Obesity, unspecified: Secondary | ICD-10-CM | POA: Insufficient documentation

## 2013-01-25 DIAGNOSIS — O99810 Abnormal glucose complicating pregnancy: Secondary | ICD-10-CM | POA: Insufficient documentation

## 2013-01-25 DIAGNOSIS — O09529 Supervision of elderly multigravida, unspecified trimester: Secondary | ICD-10-CM | POA: Insufficient documentation

## 2013-01-25 DIAGNOSIS — O352XX0 Maternal care for (suspected) hereditary disease in fetus, not applicable or unspecified: Secondary | ICD-10-CM | POA: Insufficient documentation

## 2013-01-26 ENCOUNTER — Other Ambulatory Visit: Payer: Self-pay | Admitting: Obstetrics

## 2013-01-26 DIAGNOSIS — O24419 Gestational diabetes mellitus in pregnancy, unspecified control: Secondary | ICD-10-CM

## 2013-01-26 DIAGNOSIS — O09523 Supervision of elderly multigravida, third trimester: Secondary | ICD-10-CM

## 2013-01-27 ENCOUNTER — Encounter (HOSPITAL_COMMUNITY): Payer: Self-pay | Admitting: Pharmacy Technician

## 2013-01-29 ENCOUNTER — Ambulatory Visit (INDEPENDENT_AMBULATORY_CARE_PROVIDER_SITE_OTHER): Payer: Medicaid Other | Admitting: Obstetrics

## 2013-01-29 ENCOUNTER — Other Ambulatory Visit: Payer: Self-pay | Admitting: Obstetrics

## 2013-01-29 ENCOUNTER — Ambulatory Visit (HOSPITAL_COMMUNITY): Admission: RE | Admit: 2013-01-29 | Payer: Medicaid Other | Source: Ambulatory Visit

## 2013-01-29 ENCOUNTER — Ambulatory Visit (HOSPITAL_COMMUNITY)
Admission: RE | Admit: 2013-01-29 | Discharge: 2013-01-29 | Disposition: A | Discharge status: Home / Self Care | Payer: Medicaid Other | Source: Ambulatory Visit | Attending: Obstetrics | Admitting: Obstetrics

## 2013-01-29 VITALS — BP 119/81 | Temp 98.3°F | Wt 252.2 lb

## 2013-01-29 DIAGNOSIS — Z3480 Encounter for supervision of other normal pregnancy, unspecified trimester: Secondary | ICD-10-CM

## 2013-01-29 DIAGNOSIS — O24419 Gestational diabetes mellitus in pregnancy, unspecified control: Secondary | ICD-10-CM

## 2013-01-29 DIAGNOSIS — O09529 Supervision of elderly multigravida, unspecified trimester: Secondary | ICD-10-CM | POA: Insufficient documentation

## 2013-01-29 DIAGNOSIS — O337XX0 Maternal care for disproportion due to other fetal deformities, not applicable or unspecified: Secondary | ICD-10-CM | POA: Insufficient documentation

## 2013-01-29 DIAGNOSIS — E669 Obesity, unspecified: Secondary | ICD-10-CM | POA: Insufficient documentation

## 2013-01-29 DIAGNOSIS — Z3483 Encounter for supervision of other normal pregnancy, third trimester: Secondary | ICD-10-CM

## 2013-01-29 DIAGNOSIS — O99810 Abnormal glucose complicating pregnancy: Secondary | ICD-10-CM | POA: Insufficient documentation

## 2013-01-29 DIAGNOSIS — O352XX0 Maternal care for (suspected) hereditary disease in fetus, not applicable or unspecified: Secondary | ICD-10-CM | POA: Insufficient documentation

## 2013-01-29 DIAGNOSIS — O09523 Supervision of elderly multigravida, third trimester: Secondary | ICD-10-CM

## 2013-01-29 LAB — POCT URINALYSIS DIPSTICK
Bilirubin, UA: NEGATIVE
Blood, UA: NEGATIVE
Ketones, UA: NEGATIVE
Nitrite, UA: NEGATIVE
Spec Grav, UA: 1.025
Urobilinogen, UA: NEGATIVE
pH, UA: 5

## 2013-01-29 NOTE — Progress Notes (Signed)
Pulse: 82

## 2013-01-30 ENCOUNTER — Encounter: Payer: Self-pay | Admitting: Obstetrics

## 2013-01-30 ENCOUNTER — Encounter (HOSPITAL_COMMUNITY): Payer: Self-pay

## 2013-01-30 ENCOUNTER — Encounter (HOSPITAL_COMMUNITY)
Admission: RE | Admit: 2013-01-30 | Discharge: 2013-01-30 | Disposition: A | Discharge status: Home / Self Care | Payer: Medicaid Other | Source: Ambulatory Visit | Attending: Obstetrics | Admitting: Obstetrics

## 2013-01-30 VITALS — BP 134/88 | Ht 62.0 in | Wt 250.0 lb

## 2013-01-30 DIAGNOSIS — O09529 Supervision of elderly multigravida, unspecified trimester: Secondary | ICD-10-CM

## 2013-01-30 HISTORY — DX: Headache: R51

## 2013-01-30 HISTORY — DX: Type 2 diabetes mellitus without complications: E11.9

## 2013-01-30 HISTORY — DX: Heartburn: R12

## 2013-01-30 HISTORY — DX: Unspecified asthma, uncomplicated: J45.909

## 2013-01-30 HISTORY — DX: Other specified pregnancy related conditions, unspecified trimester: O26.899

## 2013-01-30 LAB — CBC
HCT: 35.7 % — ABNORMAL LOW (ref 36.0–46.0)
Hemoglobin: 11.9 g/dL — ABNORMAL LOW (ref 12.0–15.0)
MCH: 29.6 pg (ref 26.0–34.0)
MCHC: 33.3 g/dL (ref 30.0–36.0)
MCV: 88.8 fL (ref 78.0–100.0)
Platelets: 169 10*3/uL (ref 150–400)
RBC: 4.02 MIL/uL (ref 3.87–5.11)
RDW: 13.2 % (ref 11.5–15.5)
WBC: 6.9 10*3/uL (ref 4.0–10.5)

## 2013-01-30 LAB — TYPE AND SCREEN
ABO/RH(D): A POS
Antibody Screen: NEGATIVE

## 2013-01-30 LAB — BASIC METABOLIC PANEL
BUN: 10 mg/dL (ref 6–23)
CO2: 21 mEq/L (ref 19–32)
Calcium: 9.6 mg/dL (ref 8.4–10.5)
Chloride: 103 mEq/L (ref 96–112)
Creatinine, Ser: 0.75 mg/dL (ref 0.50–1.10)
GFR calc Af Amer: >90 mL/min (ref >90)
GFR calc non Af Amer: >90 mL/min (ref >90)
Glucose, Bld: 136 mg/dL — ABNORMAL HIGH (ref 70–99)
Potassium: 4 mEq/L (ref 3.5–5.1)
Sodium: 136 mEq/L (ref 135–145)

## 2013-01-30 LAB — ABO/RH: ABO/RH(D): A POS

## 2013-01-30 LAB — RPR: RPR Ser Ql: NONREACTIVE

## 2013-01-30 NOTE — Patient Instructions (Signed)
Your procedure is scheduled on:01/31/13  Enter through the Main Entrance at :1030 am Pick up desk phone and dial [phone] and inform us of your arrival.  Please call [phone] if you have any problems the morning of surgery.  Remember: Do not eat food or drink  after midnight:tonight  Clear liquids are ok until:8am Wed   You may brush your teeth the morning of surgery. Insulin 1/2 dose tonight  DO NOT wear jewelry, eye make-up, lipstick,body lotion, or dark fingernail polish.  (Polished toes are ok) You may wear deodorant.  If you are to be admitted after surgery, leave suitcase in car until your room has been assigned. Patients discharged on the day of surgery will not be allowed to drive home. Wear loose fitting, comfortable clothes for your ride home.

## 2013-01-31 ENCOUNTER — Encounter (HOSPITAL_COMMUNITY)
Admission: RE | Disposition: A | Discharge status: Home / Self Care | Payer: Self-pay | Source: Ambulatory Visit | Attending: Obstetrics

## 2013-01-31 ENCOUNTER — Encounter (HOSPITAL_COMMUNITY): Payer: Self-pay | Admitting: *Deleted

## 2013-01-31 ENCOUNTER — Encounter: Payer: Medicaid Other | Admitting: Obstetrics

## 2013-01-31 ENCOUNTER — Inpatient Hospital Stay (HOSPITAL_COMMUNITY)
Admission: RE | Admit: 2013-01-31 | Discharge: 2013-02-03 | DRG: 765 | Disposition: A | Discharge status: Home / Self Care | Payer: Medicaid Other | Source: Ambulatory Visit | Attending: Obstetrics | Admitting: Obstetrics

## 2013-01-31 ENCOUNTER — Encounter (HOSPITAL_COMMUNITY): Payer: Self-pay | Admitting: Anesthesiology

## 2013-01-31 ENCOUNTER — Inpatient Hospital Stay (HOSPITAL_COMMUNITY): Payer: Medicaid Other | Admitting: Anesthesiology

## 2013-01-31 DIAGNOSIS — O09529 Supervision of elderly multigravida, unspecified trimester: Secondary | ICD-10-CM | POA: Diagnosis present

## 2013-01-31 DIAGNOSIS — O99814 Abnormal glucose complicating childbirth: Secondary | ICD-10-CM | POA: Diagnosis present

## 2013-01-31 DIAGNOSIS — O3660X0 Maternal care for excessive fetal growth, unspecified trimester, not applicable or unspecified: Secondary | ICD-10-CM | POA: Diagnosis present

## 2013-01-31 DIAGNOSIS — O409XX0 Polyhydramnios, unspecified trimester, not applicable or unspecified: Secondary | ICD-10-CM | POA: Diagnosis present

## 2013-01-31 DIAGNOSIS — O321XX0 Maternal care for breech presentation, not applicable or unspecified: Principal | ICD-10-CM | POA: Diagnosis present

## 2013-01-31 DIAGNOSIS — Z794 Long term (current) use of insulin: Secondary | ICD-10-CM

## 2013-01-31 LAB — GLUCOSE, CAPILLARY
Glucose-Capillary: 84 mg/dL (ref 70–99)
Glucose-Capillary: 80 mg/dL (ref 70–99)
Glucose-Capillary: 110 mg/dL — ABNORMAL HIGH (ref 70–99)

## 2013-01-31 SURGERY — Surgical Case
Anesthesia: Spinal | Site: Abdomen | Wound class: Clean Contaminated

## 2013-01-31 MED ORDER — MIDAZOLAM HCL 2 MG/2ML IJ SOLN: 0.5000 mg | Freq: Once | INTRAMUSCULAR | Status: DC | PRN

## 2013-01-31 MED ORDER — MEASLES, MUMPS & RUBELLA VAC ~~LOC~~ INJ: 0.5000 mL | INJECTION | Freq: Once | SUBCUTANEOUS | Status: DC

## 2013-01-31 MED ORDER — FENTANYL CITRATE 0.05 MG/ML IJ SOLN
INTRAMUSCULAR | Status: AC
  Filled 2013-01-31: qty 2

## 2013-01-31 MED ORDER — LACTATED RINGERS IV SOLN
INTRAVENOUS | Status: DC
  Administered 2013-01-31 (×2): via INTRAVENOUS

## 2013-01-31 MED ORDER — SIMETHICONE 80 MG PO CHEW
80.0000 mg | CHEWABLE_TABLET | ORAL | Status: DC | PRN
  Administered 2013-02-01 – 2013-02-02 (×2): 80 mg via ORAL

## 2013-01-31 MED ORDER — EPHEDRINE 5 MG/ML INJ
INTRAVENOUS | Status: AC
  Filled 2013-01-31: qty 10

## 2013-01-31 MED ORDER — DIPHENHYDRAMINE HCL 50 MG/ML IJ SOLN: 25.0000 mg | INTRAMUSCULAR | Status: DC | PRN

## 2013-01-31 MED ORDER — ONDANSETRON HCL 4 MG/2ML IJ SOLN: 4.0000 mg | INTRAMUSCULAR | Status: DC | PRN

## 2013-01-31 MED ORDER — SCOPOLAMINE 1 MG/3DAYS TD PT72: 1.0000 | MEDICATED_PATCH | Freq: Once | TRANSDERMAL | Status: DC

## 2013-01-31 MED ORDER — EPHEDRINE SULFATE 50 MG/ML IJ SOLN
INTRAMUSCULAR | Status: DC | PRN
  Administered 2013-01-31: 10 mg via INTRAVENOUS
  Administered 2013-01-31: 15 mg via INTRAVENOUS

## 2013-01-31 MED ORDER — FENTANYL CITRATE 0.05 MG/ML IJ SOLN: 25.0000 ug | INTRAMUSCULAR | Status: DC | PRN

## 2013-01-31 MED ORDER — OXYTOCIN 10 UNIT/ML IJ SOLN
INTRAMUSCULAR | Status: AC
  Filled 2013-01-31: qty 4

## 2013-01-31 MED ORDER — NALOXONE HCL 1 MG/ML IJ SOLN: 1.0000 ug/kg/h | INTRAVENOUS | Status: DC | PRN

## 2013-01-31 MED ORDER — OXYTOCIN 40 UNITS IN LACTATED RINGERS INFUSION - SIMPLE MED: 62.5000 mL/h | INTRAVENOUS | Status: AC

## 2013-01-31 MED ORDER — MEPERIDINE HCL 25 MG/ML IJ SOLN: 6.2500 mg | INTRAMUSCULAR | Status: DC | PRN

## 2013-01-31 MED ORDER — TETANUS-DIPHTH-ACELL PERTUSSIS 5-2.5-18.5 LF-MCG/0.5 IM SUSP
0.5000 mL | Freq: Once | INTRAMUSCULAR | Status: AC
  Administered 2013-02-01: 0.5 mL via INTRAMUSCULAR

## 2013-01-31 MED ORDER — ONDANSETRON HCL 4 MG/2ML IJ SOLN
INTRAMUSCULAR | Status: AC
  Filled 2013-01-31: qty 2

## 2013-01-31 MED ORDER — PROMETHAZINE HCL 25 MG/ML IJ SOLN: 6.2500 mg | INTRAMUSCULAR | Status: DC | PRN

## 2013-01-31 MED ORDER — SCOPOLAMINE 1 MG/3DAYS TD PT72
1.0000 | MEDICATED_PATCH | Freq: Once | TRANSDERMAL | Status: DC
  Administered 2013-01-31: 1.5 mg via TRANSDERMAL

## 2013-01-31 MED ORDER — CEFAZOLIN SODIUM-DEXTROSE 2-3 GM-% IV SOLR
2.0000 g | INTRAVENOUS | Status: AC
  Administered 2013-01-31: 2 g via INTRAVENOUS

## 2013-01-31 MED ORDER — MORPHINE SULFATE (PF) 0.5 MG/ML IJ SOLN
INTRAMUSCULAR | Status: DC | PRN
  Administered 2013-01-31: .15 mg via INTRATHECAL

## 2013-01-31 MED ORDER — CEFAZOLIN SODIUM-DEXTROSE 2-3 GM-% IV SOLR
INTRAVENOUS | Status: AC
  Filled 2013-01-31: qty 50

## 2013-01-31 MED ORDER — WITCH HAZEL-GLYCERIN EX PADS: 1.0000 "application " | MEDICATED_PAD | CUTANEOUS | Status: DC | PRN

## 2013-01-31 MED ORDER — ONDANSETRON HCL 4 MG/2ML IJ SOLN
INTRAMUSCULAR | Status: DC | PRN
  Administered 2013-01-31: 4 mg via INTRAVENOUS

## 2013-01-31 MED ORDER — ZOLPIDEM TARTRATE 5 MG PO TABS: 5.0000 mg | ORAL_TABLET | Freq: Every evening | ORAL | Status: DC | PRN

## 2013-01-31 MED ORDER — PHENYLEPHRINE HCL 10 MG/ML IJ SOLN
INTRAMUSCULAR | Status: DC | PRN
  Administered 2013-01-31: 80 ug via INTRAVENOUS
  Administered 2013-01-31: 40 ug via INTRAVENOUS

## 2013-01-31 MED ORDER — KETOROLAC TROMETHAMINE 30 MG/ML IJ SOLN
INTRAMUSCULAR | Status: AC
  Filled 2013-01-31: qty 1

## 2013-01-31 MED ORDER — IBUPROFEN 600 MG PO TABS
600.0000 mg | ORAL_TABLET | Freq: Four times a day (QID) | ORAL | Status: DC
  Administered 2013-01-31 – 2013-02-03 (×10): 600 mg via ORAL
  Filled 2013-01-31 (×10): qty 1

## 2013-01-31 MED ORDER — PRENATAL MULTIVITAMIN CH
1.0000 | ORAL_TABLET | Freq: Every day | ORAL | Status: DC
  Administered 2013-02-01 – 2013-02-02 (×2): 1 via ORAL
  Filled 2013-01-31 (×2): qty 1

## 2013-01-31 MED ORDER — TRIAMCINOLONE ACETONIDE 40 MG/ML IJ SUSP
40.0000 mg | Freq: Once | INTRAMUSCULAR | Status: DC
  Filled 2013-01-31: qty 1

## 2013-01-31 MED ORDER — KETOROLAC TROMETHAMINE 30 MG/ML IJ SOLN: 30.0000 mg | Freq: Four times a day (QID) | INTRAMUSCULAR | Status: AC | PRN

## 2013-01-31 MED ORDER — FENTANYL CITRATE 0.05 MG/ML IJ SOLN
INTRAMUSCULAR | Status: DC | PRN
  Administered 2013-01-31: 25 ug via INTRATHECAL

## 2013-01-31 MED ORDER — PHENYLEPHRINE HCL 10 MG/ML IJ SOLN
20.0000 mg | INTRAVENOUS | Status: DC | PRN
  Administered 2013-01-31: 40 ug via INTRAVENOUS

## 2013-01-31 MED ORDER — KETOROLAC TROMETHAMINE 30 MG/ML IJ SOLN
30.0000 mg | Freq: Four times a day (QID) | INTRAMUSCULAR | Status: AC | PRN
  Administered 2013-01-31: 30 mg via INTRAMUSCULAR

## 2013-01-31 MED ORDER — DIPHENHYDRAMINE HCL 25 MG PO CAPS: 25.0000 mg | ORAL_CAPSULE | Freq: Four times a day (QID) | ORAL | Status: DC | PRN

## 2013-01-31 MED ORDER — BUPIVACAINE IN DEXTROSE 0.75-8.25 % IT SOLN
INTRATHECAL | Status: DC | PRN
  Administered 2013-01-31: 11 mg via INTRATHECAL

## 2013-01-31 MED ORDER — BUPROPION HCL ER (XL) 150 MG PO TB24
150.0000 mg | ORAL_TABLET | Freq: Every day | ORAL | Status: DC
  Administered 2013-01-31 – 2013-02-02 (×3): 150 mg via ORAL
  Filled 2013-01-31 (×3): qty 1

## 2013-01-31 MED ORDER — ONDANSETRON HCL 4 MG/2ML IJ SOLN: 4.0000 mg | Freq: Three times a day (TID) | INTRAMUSCULAR | Status: DC | PRN

## 2013-01-31 MED ORDER — MORPHINE SULFATE 0.5 MG/ML IJ SOLN
INTRAMUSCULAR | Status: AC
  Filled 2013-01-31: qty 10

## 2013-01-31 MED ORDER — NALBUPHINE HCL 10 MG/ML IJ SOLN
5.0000 mg | INTRAMUSCULAR | Status: DC | PRN
  Filled 2013-01-31: qty 1

## 2013-01-31 MED ORDER — PHENYLEPHRINE 40 MCG/ML (10ML) SYRINGE FOR IV PUSH (FOR BLOOD PRESSURE SUPPORT)
PREFILLED_SYRINGE | INTRAVENOUS | Status: AC
  Filled 2013-01-31: qty 5

## 2013-01-31 MED ORDER — SENNOSIDES-DOCUSATE SODIUM 8.6-50 MG PO TABS
2.0000 | ORAL_TABLET | ORAL | Status: DC
  Administered 2013-01-31 – 2013-02-03 (×3): 2 via ORAL

## 2013-01-31 MED ORDER — LACTATED RINGERS IV SOLN
INTRAVENOUS | Status: DC | PRN
  Administered 2013-01-31: 13:00:00 via INTRAVENOUS

## 2013-01-31 MED ORDER — BUPIVACAINE HCL (PF) 0.25 % IJ SOLN
INTRAMUSCULAR | Status: AC
  Filled 2013-01-31: qty 30

## 2013-01-31 MED ORDER — SIMETHICONE 80 MG PO CHEW
80.0000 mg | CHEWABLE_TABLET | ORAL | Status: DC
  Administered 2013-01-31 – 2013-02-03 (×3): 80 mg via ORAL

## 2013-01-31 MED ORDER — SODIUM CHLORIDE 0.9 % IJ SOLN: 3.0000 mL | INTRAMUSCULAR | Status: DC | PRN

## 2013-01-31 MED ORDER — ONDANSETRON HCL 4 MG PO TABS: 4.0000 mg | ORAL_TABLET | ORAL | Status: DC | PRN

## 2013-01-31 MED ORDER — LANOLIN HYDROUS EX OINT: 1.0000 "application " | TOPICAL_OINTMENT | CUTANEOUS | Status: DC | PRN

## 2013-01-31 MED ORDER — SCOPOLAMINE 1 MG/3DAYS TD PT72
MEDICATED_PATCH | TRANSDERMAL | Status: AC
  Administered 2013-01-31: 1.5 mg via TRANSDERMAL
  Filled 2013-01-31: qty 1

## 2013-01-31 MED ORDER — OXYTOCIN 10 UNIT/ML IJ SOLN
40.0000 [IU] | INTRAVENOUS | Status: DC | PRN
  Administered 2013-01-31: 40 [IU] via INTRAVENOUS

## 2013-01-31 MED ORDER — DIBUCAINE 1 % RE OINT: 1.0000 "application " | TOPICAL_OINTMENT | RECTAL | Status: DC | PRN

## 2013-01-31 MED ORDER — DIPHENHYDRAMINE HCL 25 MG PO CAPS
25.0000 mg | ORAL_CAPSULE | ORAL | Status: DC | PRN
  Filled 2013-01-31: qty 1

## 2013-01-31 MED ORDER — LACTATED RINGERS IV SOLN
Freq: Once | INTRAVENOUS | Status: AC
  Administered 2013-01-31: 11:00:00 via INTRAVENOUS

## 2013-01-31 MED ORDER — LACTATED RINGERS IV SOLN
INTRAVENOUS | Status: DC
  Administered 2013-01-31 – 2013-02-01 (×2): via INTRAVENOUS

## 2013-01-31 MED ORDER — DIPHENHYDRAMINE HCL 50 MG/ML IJ SOLN: 12.5000 mg | INTRAMUSCULAR | Status: DC | PRN

## 2013-01-31 MED ORDER — NALOXONE HCL 0.4 MG/ML IJ SOLN: 0.4000 mg | INTRAMUSCULAR | Status: DC | PRN

## 2013-01-31 MED ORDER — FERROUS SULFATE 325 (65 FE) MG PO TABS
325.0000 mg | ORAL_TABLET | Freq: Two times a day (BID) | ORAL | Status: DC
  Administered 2013-02-01 – 2013-02-02 (×4): 325 mg via ORAL
  Filled 2013-01-31 (×4): qty 1

## 2013-01-31 MED ORDER — METOCLOPRAMIDE HCL 5 MG/ML IJ SOLN: 10.0000 mg | Freq: Three times a day (TID) | INTRAMUSCULAR | Status: DC | PRN

## 2013-01-31 MED ORDER — MAGNESIUM HYDROXIDE 400 MG/5ML PO SUSP: 30.0000 mL | ORAL | Status: DC | PRN

## 2013-01-31 MED ORDER — OXYCODONE-ACETAMINOPHEN 5-325 MG PO TABS
1.0000 | ORAL_TABLET | ORAL | Status: DC | PRN
  Administered 2013-02-01 (×2): 2 via ORAL
  Administered 2013-02-02: 1 via ORAL
  Administered 2013-02-02: 2 via ORAL
  Filled 2013-01-31: qty 1
  Filled 2013-01-31 (×3): qty 2

## 2013-01-31 SURGICAL SUPPLY — 46 items
ADH SKN CLS APL DERMABOND .7 (GAUZE/BANDAGES/DRESSINGS) ×1
CANISTER WOUND CARE 500ML ATS (WOUND CARE) IMPLANT
CLAMP CORD UMBIL (MISCELLANEOUS) IMPLANT
CLEANER TIP ELECTROSURG 2X2 (MISCELLANEOUS) ×2 IMPLANT
CLOTH BEACON ORANGE TIMEOUT ST (SAFETY) ×2 IMPLANT
CONTAINER PREFILL 10% NBF 15ML (MISCELLANEOUS) ×4 IMPLANT
DERMABOND ADVANCED (GAUZE/BANDAGES/DRESSINGS) ×1
DERMABOND ADVANCED .7 DNX12 (GAUZE/BANDAGES/DRESSINGS) ×1 IMPLANT
DEVICE BLD TRNS LUER ATTCH (MISCELLANEOUS) ×2 IMPLANT
DRAPE LG THREE QUARTER DISP (DRAPES) ×4 IMPLANT
DRSG OPSITE POSTOP 4X10 (GAUZE/BANDAGES/DRESSINGS) ×2 IMPLANT
DRSG VAC ATS LRG SENSATRAC (GAUZE/BANDAGES/DRESSINGS) IMPLANT
DRSG VAC ATS MED SENSATRAC (GAUZE/BANDAGES/DRESSINGS) IMPLANT
DRSG VAC ATS SM SENSATRAC (GAUZE/BANDAGES/DRESSINGS) IMPLANT
DURAPREP 26ML APPLICATOR (WOUND CARE) ×2 IMPLANT
ELECT REM PT RETURN 9FT ADLT (ELECTROSURGICAL) ×2
ELECTRODE REM PT RTRN 9FT ADLT (ELECTROSURGICAL) ×1 IMPLANT
EXTRACTOR VACUUM M CUP 4 TUBE (SUCTIONS) IMPLANT
GLOVE BIO SURGEON STRL SZ8 (GLOVE) ×4 IMPLANT
GOWN PREVENTION PLUS XLARGE (GOWN DISPOSABLE) ×2 IMPLANT
GOWN STRL REIN XL XLG (GOWN DISPOSABLE) ×4 IMPLANT
KIT ABG SYR 3ML LUER SLIP (SYRINGE) IMPLANT
NDL HYPO 25X5/8 SAFETYGLIDE (NEEDLE) ×1 IMPLANT
NEEDLE HYPO 25X5/8 SAFETYGLIDE (NEEDLE) ×2 IMPLANT
NS IRRIG 1000ML POUR BTL (IV SOLUTION) ×2 IMPLANT
PACK C SECTION WH (CUSTOM PROCEDURE TRAY) ×2 IMPLANT
PAD OB MATERNITY 4.3X12.25 (PERSONAL CARE ITEMS) ×2 IMPLANT
PENCIL BUTTON HOLSTER BLD 10FT (ELECTRODE) ×2 IMPLANT
RTRCTR C-SECT PINK 25CM LRG (MISCELLANEOUS) ×2 IMPLANT
STAPLER VISISTAT 35W (STAPLE) IMPLANT
SUT GUT PLAIN 0 CT-3 TAN 27 (SUTURE) IMPLANT
SUT MNCRL 0 VIOLET CTX 36 (SUTURE) ×3 IMPLANT
SUT MNCRL AB 4-0 PS2 18 (SUTURE) IMPLANT
SUT MON AB 2-0 CT1 27 (SUTURE) ×2 IMPLANT
SUT MON AB 3-0 SH 27 (SUTURE)
SUT MON AB 3-0 SH27 (SUTURE) IMPLANT
SUT MONOCRYL 0 CTX 36 (SUTURE) ×3
SUT PDS AB 0 CTX 60 (SUTURE) IMPLANT
SUT PLAIN 2 0 XLH (SUTURE) IMPLANT
SUT VIC AB 0 CTX 36 (SUTURE)
SUT VIC AB 0 CTX36XBRD ANBCTRL (SUTURE) IMPLANT
SUT VIC AB 2-0 CT1 27 (SUTURE)
SUT VIC AB 2-0 CT1 TAPERPNT 27 (SUTURE) IMPLANT
TOWEL OR 17X24 6PK STRL BLUE (TOWEL DISPOSABLE) ×2 IMPLANT
TRAY FOLEY CATH 14FR (SET/KITS/TRAYS/PACK) ×2 IMPLANT
WATER STERILE IRR 1000ML POUR (IV SOLUTION) ×2 IMPLANT

## 2013-01-31 NOTE — Consult Note (Signed)
Neonatology Note:  Attendance at C-section:  I was asked by Dr. Harper to attend this primary C/S at term due to breech presentation and polyhydramnios. The mother is a G2P1 A pos, GBS not found with GDM, on insulin. She has a history of HTN and depression, but was off medications by last December. ROM at delivery, fluid clear. Infant vigorous with good spontaneous cry and tone. Needed only minimal bulb suctioning. Ap 8/9. Infant very LGA. Lungs clear to ausc in DR. To CN to care of Pediatrician.  Ayub Kirsh C. Caydan Mctavish, MD  

## 2013-01-31 NOTE — Anesthesia Procedure Notes (Signed)
Spinal  Patient location during procedure: OR Start time: 01/31/2013 12:10 PM Staffing Anesthesiologist: Angus Seller., Harrell Gave. Performed by: anesthesiologist  Preanesthetic Checklist Completed: patient identified, site marked, surgical consent, pre-op evaluation, timeout performed, IV checked, risks and benefits discussed and monitors and equipment checked Spinal Block Patient position: sitting Prep: DuraPrep Patient monitoring: heart rate, cardiac monitor, continuous pulse ox and blood pressure Approach: midline Location: L3-4 Injection technique: single-shot Needle Needle type: Sprotte  Needle gauge: 24 G Needle length: 9 cm Assessment Sensory level: T4 Additional Notes Patient identified.  Risk benefits discussed including failed block, incomplete pain control, headache, nerve damage, paralysis, blood pressure changes, nausea, vomiting, reactions to medication both toxic or allergic, and postpartum back pain.  Patient expressed understanding and wished to proceed.  All questions were answered.  Sterile technique used throughout procedure.  CSF was clear.  No parasthesia or other complications.  Please see nursing notes for vital signs.

## 2013-01-31 NOTE — Anesthesia Postprocedure Evaluation (Signed)
  Anesthesia Post-op Note  Anesthesia Post Note  Patient: Natalie Kim  Procedure(s) Performed: Procedure(s) (LRB): PRIMARY CESAREAN SECTION (N/A)  Anesthesia type: Spinal  Patient location: PACU  Post pain: Pain level controlled  Post assessment: Post-op Vital signs reviewed  Last Vitals:  Filed Vitals:   01/31/13 1345  BP: 86/69  Pulse: 71  Temp:   Resp: 23    Post vital signs: Reviewed  Level of consciousness: sedated  Complications: No apparent anesthesia complications

## 2013-01-31 NOTE — H&P (Signed)
Natalie Kim is a 42 y.o. female presenting for C/S for breech presentation at [redacted] weeks gestation. Maternal Medical History:  Reason for admission: 42 yo G2 P1.  EDC 02-04-13.  Presents for primary C/S for breech presentation at [redacted] weeks gestation.  Fetal activity: Perceived fetal activity is normal.   Last perceived fetal movement was within the past hour.    Prenatal complications: Polyhydramnios.   Prenatal Complications - Diabetes: gestational. Diabetes is managed by insulin injections.      OB History   Grav Para Term Preterm Abortions TAB SAB Ect Mult Living   2 1 1       1      Past Medical History  Diagnosis Date  . Hypertension     Lisinopril (weaned off by December)  . Depression     D/C Prozac in December  . Hyperlipidemia   . Diabetes mellitus without complication 2014    gestational  . Asthma     no inhaler x 3 yrs  . Heartburn in pregnancy   . RUEAVWUJ(811.9)    Past Surgical History  Procedure Laterality Date  . Mouth surgery     Family History: family history includes Cancer in her maternal grandfather and maternal grandmother; Hypertension in her mother. Social History:  reports that she has never smoked. She has never used smokeless tobacco. She reports that she does not drink alcohol or use illicit drugs.   Prenatal Transfer Tool  Maternal Diabetes: Yes, Gestational Genetic Screening: AMA.  Seen by MFM and Geneticist. Maternal Ultrasounds/Referrals: Polyhydramnios Fetal Ultrasounds or other Referrals:  Referred to Materal Fetal Medicine  Maternal Substance Abuse:  No Significant Maternal Medications:  Insulin Significant Maternal Lab Results:  None Other Comments:  None  Review of Systems  Gastrointestinal: Positive for heartburn.  All other systems reviewed and are negative.      Blood pressure 137/90, pulse 87, temperature 98.6 F (37 C), temperature source Oral, resp. rate 16, last menstrual period 04/30/2012, SpO2 99.00%. Maternal  Exam:  Abdomen: Patient reports no abdominal tenderness. Fetal presentation: breech  Introitus: not evaluated.     Physical Exam  Nursing note and vitals reviewed. Constitutional: She is oriented to person, place, and time. She appears well-developed and well-nourished.  HENT:  Head: Normocephalic and atraumatic.  Eyes: Conjunctivae are normal. Pupils are equal, round, and reactive to light.  Neck: Normal range of motion. Neck supple.  Cardiovascular: Normal rate and regular rhythm.   Respiratory: Effort normal and breath sounds normal.  GI: Soft.  Musculoskeletal: Normal range of motion.  Neurological: She is alert and oriented to person, place, and time.  Skin: Skin is warm and dry.  Psychiatric: She has a normal mood and affect. Her behavior is normal. Judgment and thought content normal.    Prenatal labs: ABO, Rh: --/--/A POS (09/16 1220) Antibody: NEG (09/16 1211) Rubella: 2.32 (04/28 1132) RPR: NON REACTIVE (09/16 1211)  HBsAg: NEGATIVE (04/28 1132)  HIV: NON REACTIVE (05/29 0931)  GBS:     Assessment/Plan: 39.3 weeks.  Breech presentation.  Primary  C/S planned.  Will U/S for presentation prior to surgery.  Ashtin Melichar A 01/31/2013, 11:11 AM

## 2013-01-31 NOTE — Transfer of Care (Signed)
Immediate Anesthesia Transfer of Care Note  Patient: Natalie Kim  Procedure(s) Performed: Procedure(s): PRIMARY CESAREAN SECTION (N/A)  Patient Location: PACU  Anesthesia Type:Spinal  Level of Consciousness: awake, alert , oriented and patient cooperative  Airway & Oxygen Therapy: Patient Spontanous Breathing  Post-op Assessment: Report given to PACU RN and Post -op Vital signs reviewed and stable  Post vital signs: Reviewed and stable  Complications: No apparent anesthesia complications

## 2013-01-31 NOTE — Op Note (Signed)
Cesarean Section Procedure Note   Natalie Kim   01/31/2013  Indications: Breech Presentation and Scheduled Proceedure/Maternal Request.  39.[redacted] weeks gestation.  Pre-operative Diagnosis: breech; gestatestional diabetic; [redacted] weeks gestation.  Post-operative Diagnosis: Same   Surgeon: Coral Ceo A  Assistants: Antionette Char  Anesthesia: spinal  Procedure Details:  The patient was seen in the Holding Room. The risks, benefits, complications, treatment options, and expected outcomes were discussed with the patient. The patient concurred with the proposed plan, giving informed consent. The patient was identified as Natalie Kim and the procedure verified as C-Section Delivery. A Time Out was held and the above information confirmed.  After induction of anesthesia, the patient was draped and prepped in the usual sterile manner. A transverse incision was made and carried down through the subcutaneous tissue to the fascia. The fascial incision was made and extended transversely. The fascia was separated from the underlying rectus tissue superiorly and inferiorly. The peritoneum was identified and entered. The peritoneal incision was extended longitudinally. The utero-vesical peritoneal reflection was incised transversely and the bladder flap was bluntly freed from the lower uterine segment. A low transverse uterine incision was made. Delivered from cephalic presentation was a 5335 gram living newborn female infant(s). APGAR (1 MIN): 8   APGAR (5 MINS): 9   APGAR (10 MINS):    A cord ph was not sent. The umbilical cord was clamped and cut cord. A sample was obtained for evaluation. The placenta was removed Intact and appeared normal.  The uterine incision was closed with running locked sutures of 1-0 Monocryl. A second imbricating layer of the same suture was placed.  Hemostasis was observed. The paracolic gutters were irrigated. The parieto peritoneum was closed in a running fashion  with 2-0 Vicryl.  The fascia was then reapproximated with running sutures of 0 PDS.  The skin was closed with staples.  Instrument, sponge, and needle counts were correct prior the abdominal closure and were correct at the conclusion of the case.    Findings:  Normal uterus, ovaries and tubes.  Macrosomia.   Estimated Blood Loss:  Total IV Fluids:   Urine Output: 50CC OF tea colored urine  Specimens: Placenta to pathology  Complications: no complications  Disposition: PACU - hemodynamically stable.  Maternal Condition: stable   Baby condition / location:  NICU    Signed: Surgeon(s): Brock Bad, MD Kathreen Cosier, MD

## 2013-01-31 NOTE — Anesthesia Preprocedure Evaluation (Signed)
Anesthesia Evaluation  Patient identified by MRN, date of birth, ID band Patient awake    Reviewed: Allergy & Precautions, H&P , NPO status , Patient's Chart, lab work & pertinent test results  Airway Mallampati: III      Dental no notable dental hx.    Pulmonary neg pulmonary ROS, asthma ,  breath sounds clear to auscultation  Pulmonary exam normal       Cardiovascular Exercise Tolerance: Good hypertension, negative cardio ROS  Rhythm:regular Rate:Normal     Neuro/Psych  Headaches, negative neurological ROS  negative psych ROS   GI/Hepatic negative GI ROS, Neg liver ROS,   Endo/Other  negative endocrine ROSdiabetes, Insulin DependentMorbid obesity  Renal/GU negative Renal ROS  negative genitourinary   Musculoskeletal   Abdominal Normal abdominal exam  (+)   Peds  Hematology negative hematology ROS (+)   Anesthesia Other Findings   Reproductive/Obstetrics (+) Pregnancy                           Anesthesia Physical Anesthesia Plan  ASA: III  Anesthesia Plan: Spinal   Post-op Pain Management:    Induction:   Airway Management Planned:   Additional Equipment:   Intra-op Plan:   Post-operative Plan:   Informed Consent: I have reviewed the patients History and Physical, chart, labs and discussed the procedure including the risks, benefits and alternatives for the proposed anesthesia with the patient or authorized representative who has indicated his/her understanding and acceptance.     Plan Discussed with: Anesthesiologist, CRNA and Surgeon  Anesthesia Plan Comments:         Anesthesia Quick Evaluation

## 2013-01-31 NOTE — Lactation Note (Signed)
This note was copied from the chart of Girl Nayara Taplin. Lactation Consultation Note     Initial consult with this mom of a NICU baby, now 3 hours post partum.  I started mom pumping with DEP, did teaching on providing EBm for a NICU baby, and showed mom how to hand express. Mom was able to hand express (with my help) 2 mls of colostrum from her right breast, tiny drops from the left. Mom eager to provide breast milk for this baby. She has a 40 year old daughter, and she said "I was too young "" to know to breast feed then. Mom knows to call for questions/concerns.     Patient Name: Girl Beza Steppe [id number] Date: 01/31/2013 Reason for consult: Initial assessment;NICU baby;Other (Comment) (term, LGA)   Maternal Data Formula Feeding for Exclusion: Yes (baby in NICU) Infant to breast within first hour of birth: No Breastfeeding delayed due to:: Maternal status;Infant status Has patient been taught Hand Expression?: Yes Does the patient have breastfeeding experience prior to this delivery?: No  Feeding    LATCH Score/Interventions                      Lactation Tools Discussed/Used Tools: Pump Breast pump type: Double-Electric Breast Pump WIC Program: No (mom needs to apply - she may rent and not apply for [facility]) Pump Review: Setup, frequency, and cleaning;Milk Storage;Other (comment) (hand expression) Initiated by:: c Ellise Kovack RN within 3 hours of delivery Date initiated:: 01/31/13 (at 5 pm)   Consult Status Consult Status: Follow-up Date: 02/01/13 Follow-up type: In-patient    Alfred Levins 01/31/2013, 6:11 PM

## 2013-02-01 ENCOUNTER — Encounter (HOSPITAL_COMMUNITY): Payer: Self-pay | Admitting: Obstetrics

## 2013-02-01 LAB — GLUCOSE, CAPILLARY
Glucose-Capillary: 102 mg/dL — ABNORMAL HIGH (ref 70–99)
Glucose-Capillary: 124 mg/dL — ABNORMAL HIGH (ref 70–99)

## 2013-02-01 LAB — CBC
HCT: 32.1 % — ABNORMAL LOW (ref 36.0–46.0)
Hemoglobin: 10.7 g/dL — ABNORMAL LOW (ref 12.0–15.0)
MCH: 29.6 pg (ref 26.0–34.0)
MCHC: 33.3 g/dL (ref 30.0–36.0)
MCV: 88.9 fL (ref 78.0–100.0)
Platelets: 149 10*3/uL — ABNORMAL LOW (ref 150–400)
RBC: 3.61 MIL/uL — ABNORMAL LOW (ref 3.87–5.11)
RDW: 13.1 % (ref 11.5–15.5)
WBC: 7.7 10*3/uL (ref 4.0–10.5)

## 2013-02-01 NOTE — Anesthesia Postprocedure Evaluation (Signed)
  Anesthesia Post-op Note  Patient: Natalie Kim  Procedure(s) Performed: Procedure(s): PRIMARY CESAREAN SECTION (N/A)  Patient Location: [facility]  Anesthesia Type:Spinal  Level of Consciousness: awake  Airway and Oxygen Therapy: Patient Spontanous Breathing  Post-op Pain: mild  Post-op Assessment: Patient's Cardiovascular Status Stable and Respiratory Function Stable  Post-op Vital Signs: stable  Complications: No apparent anesthesia complications

## 2013-02-01 NOTE — Progress Notes (Signed)
UR completed 

## 2013-02-01 NOTE — Progress Notes (Signed)
I received a consult to see Natalie Kim.  She was in good spirits this morning and reported that she was feeling much better than yesterday now that she has had the chance to see and hold her baby, Natalie Kim.  She has received good family support and she is also feeling somewhat better now that she is up and moving around.  She is aware of on-going availability of chaplain support.  Please page as needs arise.  [employer] Pager, [phone] 10:41 AM

## 2013-02-01 NOTE — Progress Notes (Signed)
Subjective: Postpartum Day 1: Cesarean Delivery Patient reports tolerating PO, + flatus and no problems voiding.    Objective: Vital signs in last 24 hours: Temp:  [97.5 F (36.4 C)-98.5 F (36.9 C)] 98.5 F (36.9 C) (09/18 1000) Pulse Rate:  [60-75] 71 (09/18 1000) Resp:  [16-23] 18 (09/18 1000) BP: (86-122)/(58-74) 100/60 mmHg (09/18 1000) SpO2:  [95 %-99 %] 95 % (09/18 1000) Weight:  [250 lb (113.399 kg)] 250 lb (113.399 kg) (09/17 1600)  Physical Exam:  General: alert and no distress Lochia: appropriate Uterine Fundus: firm Incision: healing well DVT Evaluation: No evidence of DVT seen on physical exam.   Recent Labs  01/30/13 1212 02/01/13 0510  HGB 11.9* 10.7*  HCT 35.7* 32.1*    Assessment/Plan: Status post Cesarean section. Doing well postoperatively.  Continue current care.  HARPER,CHARLES A 02/01/2013, 10:56 AM

## 2013-02-02 NOTE — Progress Notes (Signed)
Post Partum Day 2  Subjective: no complaints, up ad lib, voiding, tolerating PO and + flatus Newborn in NICU for glucose control.  Patient's milk still not in. Patient has not had the opportunity to attempt to latch the baby. Pumping every 3 hours, milk has not yet come in.  Objective: Blood pressure 106/67, pulse 72, temperature 98.2 F (36.8 C), temperature source Oral, resp. rate 16, height 5\' 7"  (1.702 m), weight 250 lb (113.399 kg), last menstrual period 04/30/2012, SpO2 99.00%, unknown if currently breastfeeding.  Physical Exam:  General: alert and cooperative Lochia: appropriate Uterine Fundus: firm Incision: healing well, no significant drainage, no dehiscence, no significant erythema DVT Evaluation: No evidence of DVT seen on physical exam.   Recent Labs  01/30/13 1212 02/01/13 0510  HGB 11.9* 10.7*  HCT 35.7* 32.1*    Assessment/Plan: Plan for discharge tomorrow Reviewed BRF and PP education Patient to ask NICU team if she could attempt latch and ask for assistance. Partner plans vasectomy.   LOS: 2 days   [facility], Cassie Shedlock 02/02/2013, 11:02 AM

## 2013-02-02 NOTE — Progress Notes (Signed)
Subjective: Postpartum Day 2: Cesarean Delivery Patient reports tolerating PO, + flatus and no problems voiding.    Objective: Vital signs in last 24 hours: Temp:  [97.6 F (36.4 C)-98.5 F (36.9 C)] 98.2 F (36.8 C) (09/19 1478) Pulse Rate:  [71-80] 72 (09/19 0614) Resp:  [16-18] 16 (09/19 0614) BP: (100-120)/(60-67) 106/67 mmHg (09/19 0614) SpO2:  [95 %-99 %] 99 % (09/19 2956)  Physical Exam:  General: alert and no distress Lochia: appropriate Uterine Fundus: firm Incision: healing well DVT Evaluation: No evidence of DVT seen on physical exam.   Recent Labs  01/30/13 1212 02/01/13 0510  HGB 11.9* 10.7*  HCT 35.7* 32.1*    Assessment/Plan: Status post Cesarean section. Doing well postoperatively.  Continue current care.  HARPER,CHARLES A 02/02/2013, 8:43 AM

## 2013-02-02 NOTE — Lactation Note (Signed)
This note was copied from the chart of Girl Geralyn Figiel. Lactation Consultation Note  Follow up consult with this mom of a NICU baby, now 53 hours post partum. Mom has been pumping but not hand expressing.  I reviewed hand expression with mom, and she has easily expressed transitional milk.  I loaned mom a WIC DEP, and instructed her in it's use. I advised mom to pump 15-30 minutes now, to pump until she stops dripping.  I told mom that  I would consinue to work with her and baby in the NICU, and hopefully have her brest feeding prior to baby's discharge. If not, mom knows to call for oupatient lactation after the baby goes home.   Patient Name: Girl Afua Hoots [id number] Date: 02/02/2013 Reason for consult: Follow-up assessment;NICU baby   Maternal Data    Feeding    LATCH Score/Interventions                      Lactation Tools Discussed/Used Tools: Pump Pump Review: Setup, frequency, and cleaning;Other (comment) (reviewee hand expression)   Consult Status Consult Status: Follow-up Date: 02/03/13 Follow-up type: In-patient    Alfred Levins 02/02/2013, 5:37 PM

## 2013-02-02 NOTE — Lactation Note (Signed)
This note was copied from the chart of Natalie Kytzia Gienger. Lactation Consultation Note     Follow up consul twith this mom and baby, now 52 hours post partum, in the NICU. I assisted mom with latching the baby for the first time. Maddy is a LGA baby who has been sleepy, taking only partial feeds, but being fed 67 mls of formula every 3 hours by ng. She latched well with  strong, rhythmic suckles, but needed stimulation to keep sucking. I showed mom hos to position herself and the baby for cross cradle hold. It was difficult for mom to manage the baby - she weighs over 11 pounds, and mom has very small hands, but she did well with assistance. The baby was fed via an ng tube during the breast feeding. . The  mom knows to call for questtions/concerns, and is aware of the availability of o/p consults as needed.  Patient Name: Natalie Kim [id number] Date: 02/02/2013 Reason for consult: Follow-up assessment   Maternal Data    Feeding Feeding Type: Formula Length of feed: 45 min  LATCH Score/Interventions Latch: Repeated attempts needed to sustain latch, nipple held in mouth throughout feeding, stimulation needed to elicit sucking reflex. Intervention(s): Adjust position;Assist with latch;Breast massage;Breast compression  Audible Swallowing: None  Type of Nipple: Everted at rest and after stimulation  Comfort (Breast/Nipple): Soft / non-tender     Hold (Positioning): Assistance needed to correctly position infant at breast and maintain latch. Intervention(s): Breastfeeding basics reviewed;Support Pillows;Position options;Skin to skin  LATCH Score: 6  Lactation Tools Discussed/Used Tools: Pump WIC Program: Yes Pump Review: Setup, frequency, and cleaning;Other (comment) (reviewee hand expression)   Consult Status Consult Status: Follow-up Date: 02/03/13 Follow-up type: In-patient    Alfred Levins 02/02/2013, 7:28 PM

## 2013-02-03 NOTE — Progress Notes (Signed)
Clinical Social Work Department PSYCHOSOCIAL ASSESSMENT - MATERNAL/CHILD 02/02/2013  Patient:  Natalie Kim, Natalie Kim  Account Number:  [id number]  Admit Date:  01/31/2013  Marjo Bicker Name:   Lorayne Marek    Clinical Social Worker:  Lulu Riding, LCSW   Date/Time:  02/02/2013 11:00 AM  Date Referred:  02/02/2013   Referral source  NICU  Physician     Referred reason  NICU  Behavioral Health Issues   Other referral source:    I:  FAMILY / HOME ENVIRONMENT Child's legal guardian:  PARENT  Guardian - Name Guardian - Age Guardian - Address  Natalie Kim [address] [place], [place] [address]  Natalie Kim 36 same   Other household support members/support persons Name Relationship DOB  Natalie Kim DAUGHTER 48   MOTHER    Other support:   Parents state they have a great support system    II  PSYCHOSOCIAL DATA Information Source:  Family Interview  Event organiser Employment:   Surveyor, quantity resources:  [employer] If [employer] - County:  [employer] / Grade:   Maternity Care Coordinator / Child Services Coordination / Early Interventions:   CC4C  Cultural issues impacting care:   None stated    III  STRENGTHS Strengths  Adequate Resources  Compliance with medical plan  Home prepared for Child (including basic supplies)  Other - See comment  Supportive family/friends  Understanding of illness   Strength comment:  Pediatric follow up will be with Dr. Maisie Fus at [facility]   IV  RISK FACTORS AND CURRENT PROBLEMS Current Problem:  None   Risk Factor & Current Problem Patient Issue Family Issue Risk Factor / Current Problem Comment   N N     V  SOCIAL WORK ASSESSMENT CSW received consult to meet with MOB due to "bahavioral health concerns."  CSW also met with MOB due to NICU admission.  Parents were friendly and welcoming of CSW. MGM was also present and MOB stated we could discuss anything with them present.  Parents  report doing well at this time and state they have great supports and everything they need for baby at home.  They seem to have a good understanding of baby's medical needs and are hopeful that she will not need ICU care for long.  CSW discussed MOB's hx of depression and reviewed signs and symptoms of PPD. MOB states no recolection of PPD symtoms after her first child, however, states it was 19 years ago.  She states she has experienced depression in the past and was on Prozac for a period of time and then went off of it.  She states she restarted medication and is now taking Wellbutrin.  She states this is working for her.  She does not have a therapist and states that she is not interested in counseling at this time.  She appeared to be coping well and states no questions or concerns at this time.  CSW gave contact information and informed them of ongoing support services offered by NICU CSW.  Family thanked CSW for the visit.      VI SOCIAL WORK PLAN Social Work Plan  Psychosocial Support/Ongoing Assessment of Needs   Type of pt/family education:   PPD signs and symptoms  support services offered by NICU CSW   If child protective services report - county:   If child protective services report - date:   Information/referral to community resources comment:   No referral needs noted at this time.  Other social work plan:   

## 2013-02-03 NOTE — Progress Notes (Signed)
Discharge instructions reviewed with patient and significant other.  Both state understanding of home care, medications, activity, signs/symptoms to report to MD and return MD office visit.  No home equipment needed.  Patient ambulated for discharge in stable condition with staff without incident. 

## 2013-02-03 NOTE — Discharge Instructions (Signed)
Discharge instructions   You can wash your hair  Shower  Eat what you want  Drink what you want  See me in 6 weeks  Your ankles are going to swell more in the next 2 weeks than when pregnant  No sex for 6 weeks   Malayla Granberry A, MD 02/03/2013

## 2013-02-03 NOTE — Lactation Note (Signed)
This note was copied from the chart of Natalie Lissa Rowles. Lactation Consultation Note: follow up visit with mom before DC. She already has pump for home use. Reports that she pumped 3-4 times yesterday and has not pumped yet today. Reports that she is only obtaining a few cc's of breast milk. Encouraged to pump q 3 hours at least 8 times/24 hours. No questions at present. Will follow in the NICU.   Patient Name: Natalie Kim [id number] Date: 02/03/2013 Reason for consult: Follow-up assessment   Maternal Data    Feeding   LATCH Score/Interventions                      Lactation Tools Discussed/Used     Consult Status Consult Status: Complete    Pamelia Hoit 02/03/2013, 11:05 AM

## 2013-02-03 NOTE — Discharge Summary (Signed)
Obstetric Discharge Summary Reason for Admission: onset of labor Prenatal Procedures: none Intrapartum Procedures: cesarean: low cervical, transverse Postpartum Procedures: none Complications-Operative and Postpartum: none Hemoglobin  Date Value Range Status  02/01/2013 10.7* 12.0 - 15.0 g/dL Final     HCT  Date Value Range Status  02/01/2013 32.1* 36.0 - 46.0 % Final    Physical Exam:  General: alert Lochia: appropriate Uterine Fundus: firm Incision: healing well DVT Evaluation: No evidence of DVT seen on physical exam.  Discharge Diagnoses: Term Pregnancy-delivered  Discharge Information: Date: 02/03/2013 Activity: pelvic rest Diet: routine Medications: Percocet Condition: stable Instructions: refer to practice specific booklet Discharge to: home Follow-up Information   Schedule an appointment as soon as possible for a visit in 6 weeks to follow up.      Newborn Data: Live born female  Birth Weight: 11 lb 12.2 oz (5335 g) APGAR: 8, 9  Home with mother.  MARSHALL,BERNARD A 02/03/2013, 8:05 AM

## 2013-02-07 ENCOUNTER — Ambulatory Visit (INDEPENDENT_AMBULATORY_CARE_PROVIDER_SITE_OTHER): Payer: Medicaid Other | Admitting: Obstetrics

## 2013-02-07 NOTE — Progress Notes (Signed)
Subjective:     Natalie Kim is a 42 y.o. female who presents for a postpartum visit. She is 1 week postpartum following a low cervical transverse Cesarean section. I have fully reviewed the prenatal and intrapartum course. The delivery was at 39.2 gestational weeks. Outcome: primary cesarean section, low transverse incision. Anesthesia: spinal. Postpartum course has been normal. Baby's course has been normal. Baby is feeding by breast. Bleeding thin lochia. Bowel function is normal. Bladder function is normal. Patient is not sexually active. Contraception method is abstinence. Postpartum depression screening: negative.  The following portions of the patient's history were reviewed and updated as appropriate: allergies, current medications, past family history, past medical history, past social history, past surgical history and problem list.  Review of Systems Pertinent items are noted in HPI.   Objective:    BP 136/87  Pulse 78  Temp(Src) 99.2 F (37.3 C)  Wt 224 lb (101.606 kg)  BMI 35.08 kg/m2  LMP 04/30/2012  Breastfeeding? Yes  General:  alert and no distress   Breasts:  inspection negative, no nipple discharge or bleeding, no masses or nodularity palpable Abdomen:  Soft, NT.  Incision C, D, I.  Staples removed and ster strips applied.   Assessment:     Normal postpartum exam. Pap smear not done at today's visit.   Plan:    1. Contraception:  Abstinence 2. Contraceptive options discussed. 3. Follow up in: 6 weeks or as needed.

## 2013-02-08 ENCOUNTER — Encounter: Payer: Self-pay | Admitting: Obstetrics

## 2013-02-09 ENCOUNTER — Encounter: Payer: Self-pay | Admitting: Obstetrics

## 2013-02-12 ENCOUNTER — Ambulatory Visit: Payer: Medicaid Other | Admitting: Obstetrics

## 2013-03-21 ENCOUNTER — Encounter: Payer: Self-pay | Admitting: Obstetrics

## 2013-03-21 ENCOUNTER — Ambulatory Visit (INDEPENDENT_AMBULATORY_CARE_PROVIDER_SITE_OTHER): Payer: Medicaid Other | Admitting: Obstetrics

## 2013-03-21 NOTE — Progress Notes (Signed)
.   Subjective:     Natalie Kim is a 42 y.o. female who presents for a postpartum visit. She is 6 weeks postpartum following a low cervical transverse Cesarean section. I have fully reviewed the prenatal and intrapartum course. The delivery was at 39 gestational weeks. Outcome: primary cesarean section, low transverse incision. Anesthesia: epidural. Postpartum course has been normal. Baby's course has been normal. Baby is feeding by bottle - Enfamil AR. Bleeding no bleeding. Bowel function is normal. Bladder function is normal. Patient is not sexually active. Contraception method is none. Postpartum depression screening: negative.  The following portions of the patient's history were reviewed and updated as appropriate: allergies, current medications, past family history, past medical history, past social history, past surgical history and problem list.  Review of Systems Pertinent items are noted in HPI.   Objective:    BP 129/92  Pulse 77  Temp(Src) 98.2 F (36.8 C) (Oral)  Ht 5\' 2"  (1.575 m)  Wt 218 lb 12.8 oz (99.247 kg)  BMI 40.01 kg/m2  Breastfeeding? No  LGeneral:  alert and no distress   Breasts:  inspection negative, no nipple discharge or bleeding, no masses or nodularity palpable Abdomen:  Soft, NT. Uterus NSSC, NT.   Assessment:     Normal postpartum exam. Pap smear not done at today's visit.   Plan:    1. Contraception: none 2. Contraceptive options discussed. 3. Follow up in: 6 weeks or as needed.

## 2013-03-22 ENCOUNTER — Other Ambulatory Visit: Payer: Self-pay

## 2013-05-02 ENCOUNTER — Ambulatory Visit: Payer: Medicaid Other | Admitting: Obstetrics

## 2013-08-23 ENCOUNTER — Other Ambulatory Visit: Payer: Self-pay

## 2014-03-18 ENCOUNTER — Encounter: Payer: Self-pay | Admitting: Obstetrics

## 2014-05-15 ENCOUNTER — Other Ambulatory Visit: Payer: Self-pay | Admitting: Family Medicine

## 2014-05-15 DIAGNOSIS — M5410 Radiculopathy, site unspecified: Secondary | ICD-10-CM

## 2014-05-15 DIAGNOSIS — R202 Paresthesia of skin: Secondary | ICD-10-CM

## 2014-05-29 ENCOUNTER — Other Ambulatory Visit: Payer: Medicaid Other

## 2014-06-16 ENCOUNTER — Emergency Department (HOSPITAL_COMMUNITY)
Admission: EM | Admit: 2014-06-16 | Discharge: 2014-06-16 | Disposition: A | Discharge status: Home / Self Care | Payer: 59 | Attending: Emergency Medicine | Admitting: Emergency Medicine

## 2014-06-16 ENCOUNTER — Encounter (HOSPITAL_COMMUNITY): Payer: Self-pay | Admitting: *Deleted

## 2014-06-16 ENCOUNTER — Emergency Department (HOSPITAL_COMMUNITY): Payer: 59

## 2014-06-16 DIAGNOSIS — Z8632 Personal history of gestational diabetes: Secondary | ICD-10-CM | POA: Diagnosis not present

## 2014-06-16 DIAGNOSIS — J45909 Unspecified asthma, uncomplicated: Secondary | ICD-10-CM | POA: Diagnosis not present

## 2014-06-16 DIAGNOSIS — R1013 Epigastric pain: Secondary | ICD-10-CM | POA: Diagnosis present

## 2014-06-16 DIAGNOSIS — K8070 Calculus of gallbladder and bile duct without cholecystitis without obstruction: Secondary | ICD-10-CM | POA: Diagnosis not present

## 2014-06-16 DIAGNOSIS — F329 Major depressive disorder, single episode, unspecified: Secondary | ICD-10-CM | POA: Insufficient documentation

## 2014-06-16 DIAGNOSIS — I10 Essential (primary) hypertension: Secondary | ICD-10-CM | POA: Insufficient documentation

## 2014-06-16 DIAGNOSIS — Z79899 Other long term (current) drug therapy: Secondary | ICD-10-CM | POA: Insufficient documentation

## 2014-06-16 DIAGNOSIS — Z3202 Encounter for pregnancy test, result negative: Secondary | ICD-10-CM | POA: Insufficient documentation

## 2014-06-16 DIAGNOSIS — Z91040 Latex allergy status: Secondary | ICD-10-CM | POA: Diagnosis not present

## 2014-06-16 DIAGNOSIS — Z8639 Personal history of other endocrine, nutritional and metabolic disease: Secondary | ICD-10-CM | POA: Insufficient documentation

## 2014-06-16 LAB — URINALYSIS, ROUTINE W REFLEX MICROSCOPIC
Bilirubin Urine: NEGATIVE
Glucose, UA: NEGATIVE mg/dL
Hgb urine dipstick: NEGATIVE
Ketones, ur: NEGATIVE mg/dL
Nitrite: NEGATIVE
Protein, ur: NEGATIVE mg/dL
Specific Gravity, Urine: 1.02 (ref 1.005–1.030)
Urobilinogen, UA: 0.2 mg/dL (ref 0.0–1.0)
pH: 5.5 (ref 5.0–8.0)

## 2014-06-16 LAB — URINE MICROSCOPIC-ADD ON

## 2014-06-16 LAB — LIPASE, BLOOD: Lipase: 30 U/L (ref 11–59)

## 2014-06-16 LAB — CBC WITH DIFFERENTIAL/PLATELET
Basophils Absolute: 0 10*3/uL (ref 0.0–0.1)
Basophils Relative: 0 % (ref 0–1)
Eosinophils Absolute: 0.1 10*3/uL (ref 0.0–0.7)
Eosinophils Relative: 1 % (ref 0–5)
HCT: 43.1 % (ref 36.0–46.0)
Hemoglobin: 14.3 g/dL (ref 12.0–15.0)
Lymphocytes Relative: 16 % (ref 12–46)
Lymphs Abs: 1.3 10*3/uL (ref 0.7–4.0)
MCH: 29.3 pg (ref 26.0–34.0)
MCHC: 33.2 g/dL (ref 30.0–36.0)
MCV: 88.3 fL (ref 78.0–100.0)
Monocytes Absolute: 0.3 10*3/uL (ref 0.1–1.0)
Monocytes Relative: 4 % (ref 3–12)
Neutro Abs: 6.6 10*3/uL (ref 1.7–7.7)
Neutrophils Relative %: 79 % — ABNORMAL HIGH (ref 43–77)
Platelets: 231 10*3/uL (ref 150–400)
RBC: 4.88 MIL/uL (ref 3.87–5.11)
RDW: 13.3 % (ref 11.5–15.5)
WBC: 8.4 10*3/uL (ref 4.0–10.5)

## 2014-06-16 LAB — COMPREHENSIVE METABOLIC PANEL
ALT: 18 U/L (ref 0–35)
AST: 29 U/L (ref 0–37)
Albumin: 4.1 g/dL (ref 3.5–5.2)
Alkaline Phosphatase: 47 U/L (ref 39–117)
Anion gap: 10 (ref 5–15)
BUN: 12 mg/dL (ref 6–23)
CO2: 24 mmol/L (ref 19–32)
Calcium: 9.8 mg/dL (ref 8.4–10.5)
Chloride: 101 mmol/L (ref 96–112)
Creatinine, Ser: 0.76 mg/dL (ref 0.50–1.10)
GFR calc Af Amer: >90 mL/min (ref >90)
GFR calc non Af Amer: >90 mL/min (ref >90)
Glucose, Bld: 177 mg/dL — ABNORMAL HIGH (ref 70–99)
Potassium: 3.9 mmol/L (ref 3.5–5.1)
Sodium: 135 mmol/L (ref 135–145)
Total Bilirubin: 0.5 mg/dL (ref 0.3–1.2)
Total Protein: 7.3 g/dL (ref 6.0–8.3)

## 2014-06-16 LAB — I-STAT TROPONIN, ED: Troponin i, poc: 0 ng/mL (ref 0.00–0.08)

## 2014-06-16 LAB — PREGNANCY, URINE: Preg Test, Ur: NEGATIVE

## 2014-06-16 MED ORDER — FAMOTIDINE IN NACL 20-0.9 MG/50ML-% IV SOLN
20.0000 mg | Freq: Once | INTRAVENOUS | Status: AC
  Administered 2014-06-16: 20 mg via INTRAVENOUS
  Filled 2014-06-16: qty 50

## 2014-06-16 MED ORDER — HYDROMORPHONE HCL 1 MG/ML IJ SOLN
1.0000 mg | Freq: Once | INTRAMUSCULAR | Status: AC
Start: 2014-06-16 — End: 2014-06-16
  Administered 2014-06-16: 0.8 mg via INTRAVENOUS
  Filled 2014-06-16: qty 1

## 2014-06-16 MED ORDER — SODIUM CHLORIDE 0.9 % IV BOLUS (SEPSIS)
1000.0000 mL | Freq: Once | INTRAVENOUS | Status: AC
  Administered 2014-06-16: 1000 mL via INTRAVENOUS

## 2014-06-16 MED ORDER — ONDANSETRON 8 MG PO TBDP: 8.0000 mg | ORAL_TABLET | Freq: Three times a day (TID) | ORAL | Status: DC | PRN

## 2014-06-16 MED ORDER — HYDROCODONE-ACETAMINOPHEN 5-325 MG PO TABS: 1.0000 | ORAL_TABLET | Freq: Four times a day (QID) | ORAL | Status: DC | PRN

## 2014-06-16 MED ORDER — MORPHINE SULFATE 4 MG/ML IJ SOLN: 4.0000 mg | Freq: Once | INTRAMUSCULAR | Status: DC

## 2014-06-16 MED ORDER — HYDROCODONE-ACETAMINOPHEN 5-325 MG PO TABS
1.0000 | ORAL_TABLET | Freq: Once | ORAL | Status: AC
  Administered 2014-06-16: 1 via ORAL
  Filled 2014-06-16: qty 1

## 2014-06-16 MED ORDER — ONDANSETRON HCL 4 MG/2ML IJ SOLN
4.0000 mg | Freq: Once | INTRAMUSCULAR | Status: AC
  Administered 2014-06-16: 4 mg via INTRAVENOUS
  Filled 2014-06-16: qty 2

## 2014-06-16 NOTE — ED Provider Notes (Signed)
CSN: [id number]     Arrival date & time 06/16/14  2130 History   First MD Initiated Contact with Patient 06/16/14 [phone]     Chief Complaint  Patient presents with  . Abdominal Pain     (Consider location/radiation/quality/duration/timing/severity/associated sxs/prior Treatment) HPI Comments: PT comes in with cc of abd pain. Pt has hx of HL, HTN, csection. Comes in with abd pain that started at 10 pm, new onset. Pain is on the right side and epigastrium and non radiating. There is + nausea and emesis x 2. Pt denies any hx of similar pain or pain with food intake.   ROS 10 Systems reviewed and are negative for acute change except as noted in the HPI.     Patient is a 44 y.o. female presenting with abdominal pain. The history is provided by the patient.  Abdominal Pain Associated symptoms: nausea and vomiting   Associated symptoms: no chest pain, no dysuria and no shortness of breath     Past Medical History  Diagnosis Date  . Hypertension     Lisinopril (weaned off by December)  . Depression     D/C Prozac in December  . Hyperlipidemia   . Diabetes mellitus without complication 2014    gestational  . Asthma     no inhaler x 3 yrs  . Heartburn in pregnancy   . QIONGEXB(284.1)    Past Surgical History  Procedure Laterality Date  . Mouth surgery    . Cesarean section N/A 01/31/2013    Procedure: PRIMARY CESAREAN SECTION;  Surgeon: Brock Bad, MD;  Location: [facility];  Service: Obstetrics;  Laterality: N/A;   Family History  Problem Relation Age of Onset  . Hypertension Mother   . Cancer Maternal Grandmother   . Cancer Maternal Grandfather    History  Substance Use Topics  . Smoking status: Never Smoker   . Smokeless tobacco: Never Used  . Alcohol Use: No   OB History    Gravida Para Term Preterm AB TAB SAB Ectopic Multiple Living   Review of Systems  Constitutional: Negative for activity change.  Respiratory: Negative for shortness of  breath.   Cardiovascular: Negative for chest pain.  Gastrointestinal: Positive for nausea, vomiting and abdominal pain.  Genitourinary: Negative for dysuria.  Musculoskeletal: Negative for neck pain.  Neurological: Negative for headaches.  All other systems reviewed and are negative.     Allergies  Latex and Other  Home Medications   Prior to Admission medications   Medication Sig Start Date End Date Taking? Authorizing Provider  escitalopram (LEXAPRO) 10 MG tablet Take 10 mg by mouth daily.   Yes Historical Provider, MD  lisinopril (PRINIVIL,ZESTRIL) 30 MG tablet Take 30 mg by mouth daily.   Yes Historical Provider, MD  HYDROcodone-acetaminophen (NORCO/VICODIN) 5-325 MG per tablet Take 1 tablet by mouth every 6 (six) hours as needed. 06/16/14   Derwood Kaplan, MD  ondansetron (ZOFRAN ODT) 8 MG disintegrating tablet Take 1 tablet (8 mg total) by mouth every 8 (eight) hours as needed for nausea. 06/16/14   Kule Gascoigne Rhunette Croft, MD   BP 125/81 mmHg  Pulse 74  Temp(Src) 98.1 F (36.7 C) (Oral)  Resp 20  Ht  (1.575 m)  Wt 245 lb 9.6 oz (111.403 kg)  BMI 44.91 kg/m2  SpO2 95%  LMP 06/13/2014 Physical Exam  Constitutional: She is oriented to person, place, and time. She appears well-developed and  well-nourished.  HENT:  Head: Normocephalic and atraumatic.  Eyes: EOM are normal. Pupils are equal, round, and reactive to light.  Neck: Neck supple.  Cardiovascular: Normal rate, regular rhythm and normal heart sounds.   No murmur heard. Pulmonary/Chest: Effort normal. No respiratory distress.  Abdominal: Soft. She exhibits no distension. There is tenderness. There is no rebound and no guarding.  Neg Murphy's sign  Neurological: She is alert and oriented to person, place, and time.  Skin: Skin is warm and dry.  Nursing note and vitals reviewed.   ED Course  Procedures (including critical care time) Labs Review Labs Reviewed  CBC WITH DIFFERENTIAL/PLATELET - Abnormal; Notable for  the following:    Neutrophils Relative % 79 (*)    All other components within normal limits  COMPREHENSIVE METABOLIC PANEL - Abnormal; Notable for the following:    Glucose, Bld 177 (*)    All other components within normal limits  URINALYSIS, ROUTINE W REFLEX MICROSCOPIC - Abnormal; Notable for the following:    Leukocytes, UA SMALL (*)    All other components within normal limits  LIPASE, BLOOD  PREGNANCY, URINE  URINE MICROSCOPIC-ADD ON  I-STAT TROPOININ, ED    Imaging Review Kore[place] Abdomen Limited  06/16/2014   CLINICAL DATA:  Right upper quadrant pain, nausea and vomiting.  EXAM: US ABDOMEN LIMITED - RIGHT UPPER QUADRANT  COMPARISON:  None.  FINDINGS: Gallbladder:  Stone in the gallbladder neck measuring 1.1 cm diameter. No other gallstones identified. No gallbladder wall thickening or edema. No sludge. Murphy's sign is negative.  Common bile duct:  Diameter: 4.8 mm, normal  Liver:  Diffusely increased parenchymal echotexture suggesting fatty infiltration. No focal lesions appreciated as visualized.  IMPRESSION: Stone in the gallbladder neck. No other inflammatory changes demonstrated in the gallbladder. Diffuse fatty infiltration of the liver. It   Electronically Signed   By: Burman NievesWilliam  Stevens M.D.   On: 06/16/2014 07:01     EKG Interpretation None      MDM   Final diagnoses:  Calculus of gallbladder and bile duct without cholecystitis or obstruction    DDx includes: Pancreatitis Hepatobiliary pathology including cholecystitis Gastritis/PUD SBO ACS syndrome  Pt  Comes in with cc of abd pain. Location of the pain consistent with hepatobiliary pathology, and US confirms a gall stone. Pt reassessed at 7:15 am, and she describes the pain now as mild, tolerable and she has no nausea.  With pain being tolerable, and pt being reliable, we have decided to discharge her home. She will return to the ER if her symptoms become unbearable, otherwise she will contact Surgery  team.     Derwood KaplanAnkit Merek Niu, MD 06/16/14 [phone]

## 2014-06-16 NOTE — Discharge Instructions (Signed)
You have a gallstone. Please take the medicine provided for the pain and nausea. See the Surgeons as soon as possible.  Return to the ER if the symptoms get worse and you start having fevers.   Cholelithiasis Cholelithiasis (also called gallstones) is a form of gallbladder disease in which gallstones form in your gallbladder. The gallbladder is an organ that stores bile made in the liver, which helps digest fats. Gallstones begin as small crystals and slowly grow into stones. Gallstone pain occurs when the gallbladder spasms and a gallstone is blocking the duct. Pain can also occur when a stone passes out of the duct.  RISK FACTORS  Being female.   Having multiple pregnancies. Health care providers sometimes advise removing diseased gallbladders before future pregnancies.   Being obese.  Eating a diet heavy in fried foods and fat.   Being older than 60 years and increasing age.   Prolonged use of medicines containing female hormones.   Having diabetes mellitus.   Rapidly losing weight.   Having a family history of gallstones (heredity).  SYMPTOMS  Nausea.   Vomiting.  Abdominal pain.   Yellowing of the skin (jaundice).   Sudden pain. It may persist from several minutes to several hours.  Fever.   Tenderness to the touch. In some cases, when gallstones do not move into the bile duct, people have no pain or symptoms. These are called "silent" gallstones.  TREATMENT Silent gallstones do not need treatment. In severe cases, emergency surgery may be required. Options for treatment include:  Surgery to remove the gallbladder. This is the most common treatment.  Medicines. These do not always work and may take 6-12 months or more to work.  Shock wave treatment (extracorporeal biliary lithotripsy). In this treatment an ultrasound machine sends shock waves to the gallbladder to break gallstones into smaller pieces that can pass into the intestines or be  dissolved by medicine. HOME CARE INSTRUCTIONS   Only take over-the-counter or prescription medicines for pain, discomfort, or fever as directed by your health care provider.   Follow a low-fat diet until seen again by your health care provider. Fat causes the gallbladder to contract, which can result in pain.   Follow up with your health care provider as directed. Attacks are almost always recurrent and surgery is usually required for permanent treatment.  SEEK IMMEDIATE MEDICAL CARE IF:   Your pain increases and is not controlled by medicines.   You have a fever or persistent symptoms for more than 2-3 days.   You have a fever and your symptoms suddenly get worse.   You have persistent nausea and vomiting.  MAKE SURE YOU:   Understand these instructions.  Will watch your condition.  Will get help right away if you are not doing well or get worse. Document Released: 04/29/2005 Document Revised: 01/03/2013 Document Reviewed: 10/25/2012 [facility] Patient Information 2015 [place], [place]. This information is not intended to replace advice given to you by your health care provider. Make sure you discuss any questions you have with your health care provider.   There is no specific diet for treating symptoms of gallstones. However, eating a healthy balanced diet that is naturally low in fat may help to reduce symptoms. If you are overweight, losing weight will be beneficial. However, it is important to do this gradually, as rapid weight loss has been associated with the development of gallstones. A safe weight loss of 1-2 lbs (0.5 to 1 kg) per week is recommended.  A healthy  balanced diet consists of:  Plenty of fruit and vegetables. Aim to have at least five portions each day. Plenty of starchy carbohydrates. Examples include bread, rice, cereals, pasta, potatoes, chapattis and plantain. Choose wholegrain varieties when possible. Some milk and dairy products (2-3 portions per day).  Choose low-fat dairy products. Some meat, fish, eggs and alternatives such as beans and pulses. Limited amounts of foods high in fats and sugars. Limit saturated fat that is found in animal products, such as butter, ghee, cheese, meat, cakes, biscuits and pastries. Replace these with unsaturated fats found in vegetable oils, such as sunflower, rapeseed and olive oil, avocados, nuts and seeds. Make sure your diet is high in fibre. This can be found in beans, pulses, fruit and vegetables, oats, and wholewheat products, such as bread, pasta and rice. Drink plenty of fluid - at least two litres daily, such as water or herbal teas. Try not to eat too much fat at one mealtime. It might be helpful to have smaller, frequent meals. Some people find that specific foods are the triggers for symptoms. Keep a food and symptom diary to identify trigger foods. Avoid these foods for a two-week trial period and note any improvements in symptoms.  Cutting down on fat A high-fat diet and fatty foods can sometimes cause discomfort and painful symptoms. They may also cause steatorrhoea (fatty stools), which are oily, pale and smelly. Steatorrhoea is a sign that fat is not being digested properly.  Here are some ways to cut down on fat in the diet.  High-fat foods Lower-fat alternatives Butter, lard, ghee, oils, spreads.  Lower-fat/light spreads, oil sprays for cooking, jam, honey.  Whole milk, cream, full-fat yoghurts.  Skimmed or semi-skimmed milk, half-fat crme frache, low-fat evaporated milk, low-fat or fat-free yoghurt.  Full-fat cheese, such as Cheddar, Brie and Stilton.  Cottage cheese, light soft cheeses such as [place] or Dairylea Light, quark, reduced-fat Cheddar cheese or naturally lower-fat cheeses such as mozzarella and ricotta (matchbox-sized portion).   Snacks, such as cakes, biscuits, pastries, crisps and nuts. Toasted teacakes, low-fat popcorn, fruit and vegetables, dried fruit,  meringues, rice cakes, Rich Tea biscuits, low-fat crisps such as Quavers or Skips.  Puddings, such as pies, ice cream and custards. Jelly, sugar-free jelly, low-fat custard, rice pudding made with semi-skimmed milk, sorbet, tinned or stewed fruit, low-fat yoghurts Sauces and dressings, such as mayonnaise, creamy sauces. Light mayonaisse, vinaigrettes, mustard, lemon juice, fat-free salad dressings, tomato-based sauces (some can contain oil), salsa, balsamic dressing. Meats and processed meats, such as sausages, salami, corned beef, bacon, gammon, pork, lamb, beef mince, beefburgers, meat pies, fish tinned in oil. Chicken, [place], lean ham, lean or extra lean beef mince, [place] mince, red meat with visible fat cut off, and white fish, such as cod, haddock, pollock, and fish tinned in brine or water. Note: many processed foods that are low in fat can contain high amounts of sugar. Check the labels for high-sugar products and try to keep these to a minimum. A product that is high in sugar contains more than 10 g of sugar per 100 g.  Reduced-fat, light and low-fat are not the same thing. If a product is low-fat, this means that the product contains 3 g or fewer of fat per 100 g and is actually low in fat. A reduced-fat product does not mean that the product is necessarily low in fat. It means that the product contains 25% less fat than the original product, which is usually a very high-fat product,  such as mayonnaise or Cheddar cheese. This is a similar for 'light' products, which contain about a third fewer calories than the original product, or 50% less fat. Therefore, keep these to a minimum when choosing reduced-fat or lighter products.

## 2014-06-16 NOTE — ED Notes (Signed)
Epigastric psin with rt oposterior chest radiation since 2200 with n v

## 2014-06-26 ENCOUNTER — Ambulatory Visit
Admission: RE | Admit: 2014-06-26 | Discharge: 2014-06-26 | Disposition: A | Discharge status: Home / Self Care | Payer: 59 | Source: Ambulatory Visit | Attending: Family Medicine | Admitting: Family Medicine

## 2014-06-26 ENCOUNTER — Other Ambulatory Visit: Payer: Self-pay | Admitting: Family Medicine

## 2014-06-26 DIAGNOSIS — M542 Cervicalgia: Secondary | ICD-10-CM

## 2014-08-08 ENCOUNTER — Other Ambulatory Visit: Payer: Self-pay | Admitting: Family Medicine

## 2014-08-08 DIAGNOSIS — Z1231 Encounter for screening mammogram for malignant neoplasm of breast: Secondary | ICD-10-CM

## 2014-08-21 ENCOUNTER — Ambulatory Visit
Admission: RE | Admit: 2014-08-21 | Discharge: 2014-08-21 | Disposition: A | Discharge status: Home / Self Care | Payer: 59 | Source: Ambulatory Visit | Attending: Family Medicine | Admitting: Family Medicine

## 2014-08-21 ENCOUNTER — Other Ambulatory Visit: Payer: Self-pay | Admitting: Family Medicine

## 2014-08-21 DIAGNOSIS — Z1231 Encounter for screening mammogram for malignant neoplasm of breast: Secondary | ICD-10-CM

## 2014-11-11 ENCOUNTER — Other Ambulatory Visit: Payer: Self-pay

## 2015-09-15 DIAGNOSIS — Z136 Encounter for screening for cardiovascular disorders: Secondary | ICD-10-CM | POA: Diagnosis not present

## 2015-09-15 DIAGNOSIS — E559 Vitamin D deficiency, unspecified: Secondary | ICD-10-CM | POA: Diagnosis not present

## 2015-09-15 DIAGNOSIS — Z0000 Encounter for general adult medical examination without abnormal findings: Secondary | ICD-10-CM | POA: Diagnosis not present

## 2015-09-17 ENCOUNTER — Other Ambulatory Visit: Payer: Self-pay | Admitting: Family Medicine

## 2015-09-17 DIAGNOSIS — Z0000 Encounter for general adult medical examination without abnormal findings: Secondary | ICD-10-CM | POA: Diagnosis not present

## 2015-09-17 DIAGNOSIS — Z1231 Encounter for screening mammogram for malignant neoplasm of breast: Secondary | ICD-10-CM

## 2015-09-17 DIAGNOSIS — Z23 Encounter for immunization: Secondary | ICD-10-CM | POA: Diagnosis not present

## 2015-09-17 DIAGNOSIS — Z6841 Body Mass Index (BMI) 40.0 and over, adult: Secondary | ICD-10-CM | POA: Diagnosis not present

## 2015-09-19 ENCOUNTER — Ambulatory Visit: Payer: Self-pay

## 2015-09-24 DIAGNOSIS — E1121 Type 2 diabetes mellitus with diabetic nephropathy: Secondary | ICD-10-CM | POA: Diagnosis not present

## 2015-09-29 ENCOUNTER — Ambulatory Visit
Admission: RE | Admit: 2015-09-29 | Discharge: 2015-09-29 | Disposition: A | Discharge status: Home / Self Care | Payer: BLUE CROSS/BLUE SHIELD | Source: Ambulatory Visit | Attending: Family Medicine | Admitting: Family Medicine

## 2015-09-29 DIAGNOSIS — Z1231 Encounter for screening mammogram for malignant neoplasm of breast: Secondary | ICD-10-CM

## 2015-10-10 DIAGNOSIS — J069 Acute upper respiratory infection, unspecified: Secondary | ICD-10-CM | POA: Diagnosis not present

## 2015-10-10 DIAGNOSIS — R0902 Hypoxemia: Secondary | ICD-10-CM | POA: Diagnosis not present

## 2015-10-10 DIAGNOSIS — J45901 Unspecified asthma with (acute) exacerbation: Secondary | ICD-10-CM | POA: Diagnosis not present

## 2015-10-24 DIAGNOSIS — M7540 Impingement syndrome of unspecified shoulder: Secondary | ICD-10-CM | POA: Diagnosis not present

## 2015-10-24 DIAGNOSIS — J45901 Unspecified asthma with (acute) exacerbation: Secondary | ICD-10-CM | POA: Diagnosis not present

## 2015-10-24 DIAGNOSIS — J349 Unspecified disorder of nose and nasal sinuses: Secondary | ICD-10-CM | POA: Diagnosis not present

## 2015-10-24 DIAGNOSIS — E1121 Type 2 diabetes mellitus with diabetic nephropathy: Secondary | ICD-10-CM | POA: Diagnosis not present

## 2015-11-09 IMAGING — CR DG CERVICAL SPINE COMPLETE 4+V
6 series · 6 of 6 positions shown · non-contrast
Comparison: None.

CLINICAL DATA: Neck and bilateral shoulder pain for the past 7
months greater on the right; no trauma

EXAM:
CERVICAL SPINE  4+ VIEWS

[view not recorded (1 of 6)]
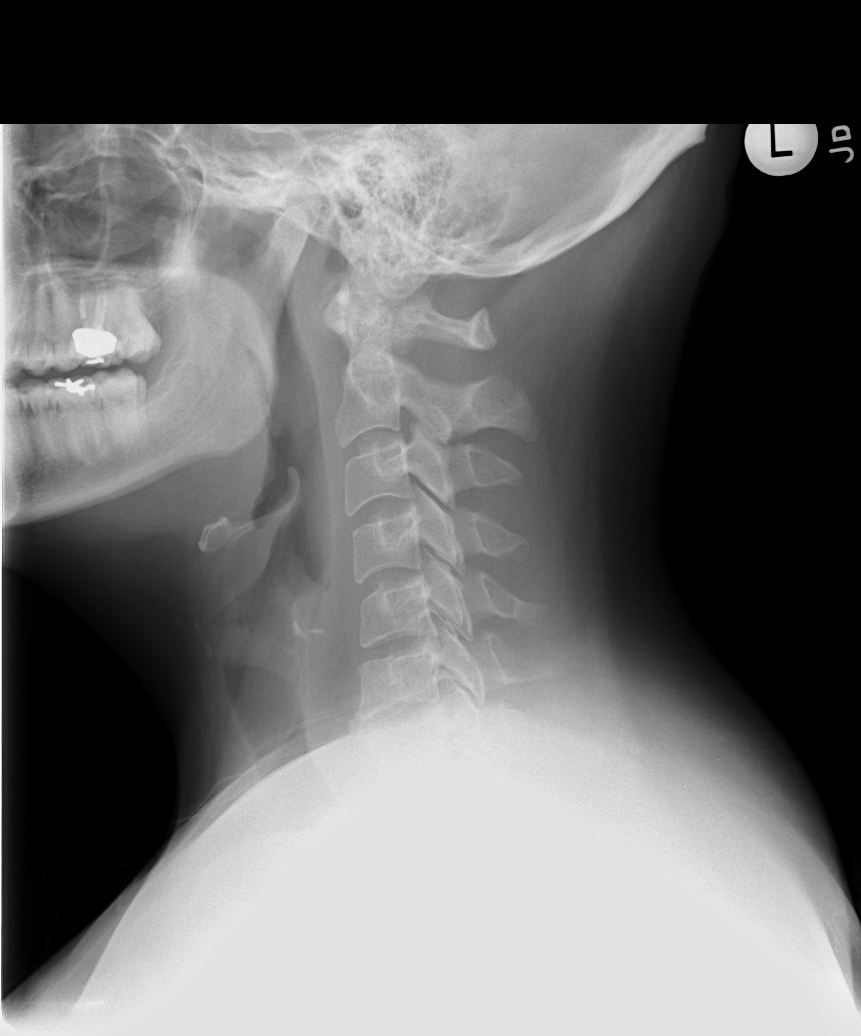

[view not recorded (2 of 6)]
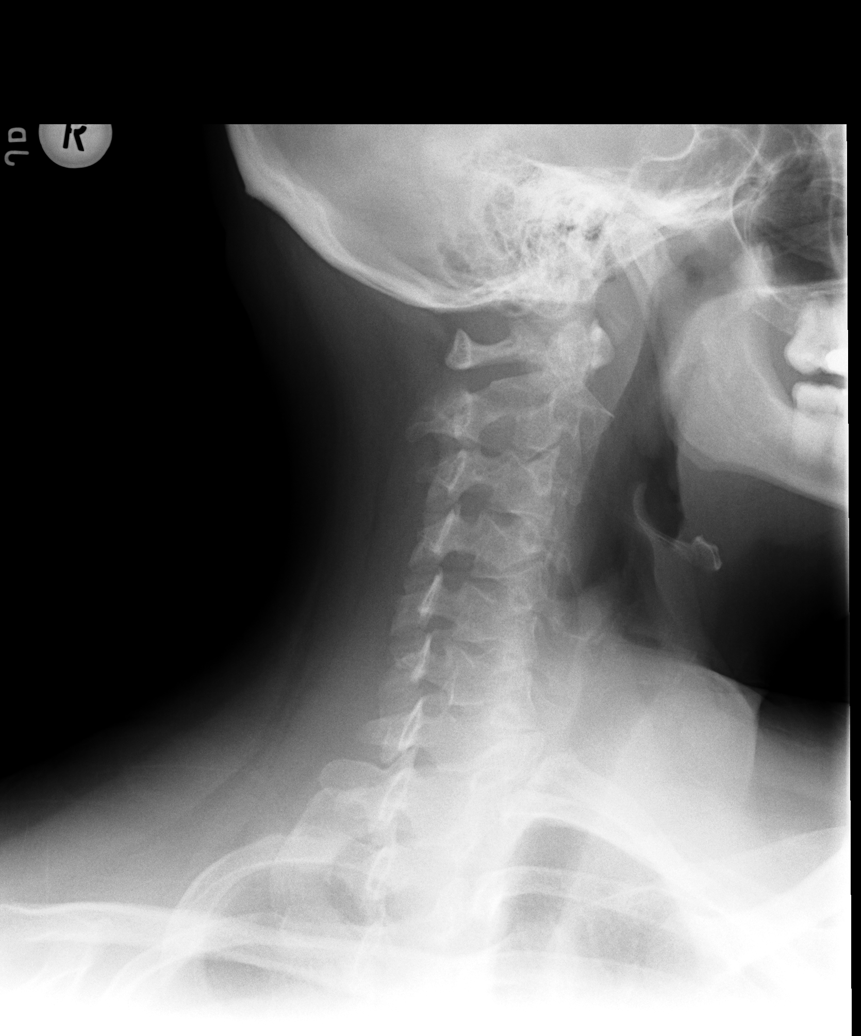

[view not recorded (3 of 6)]
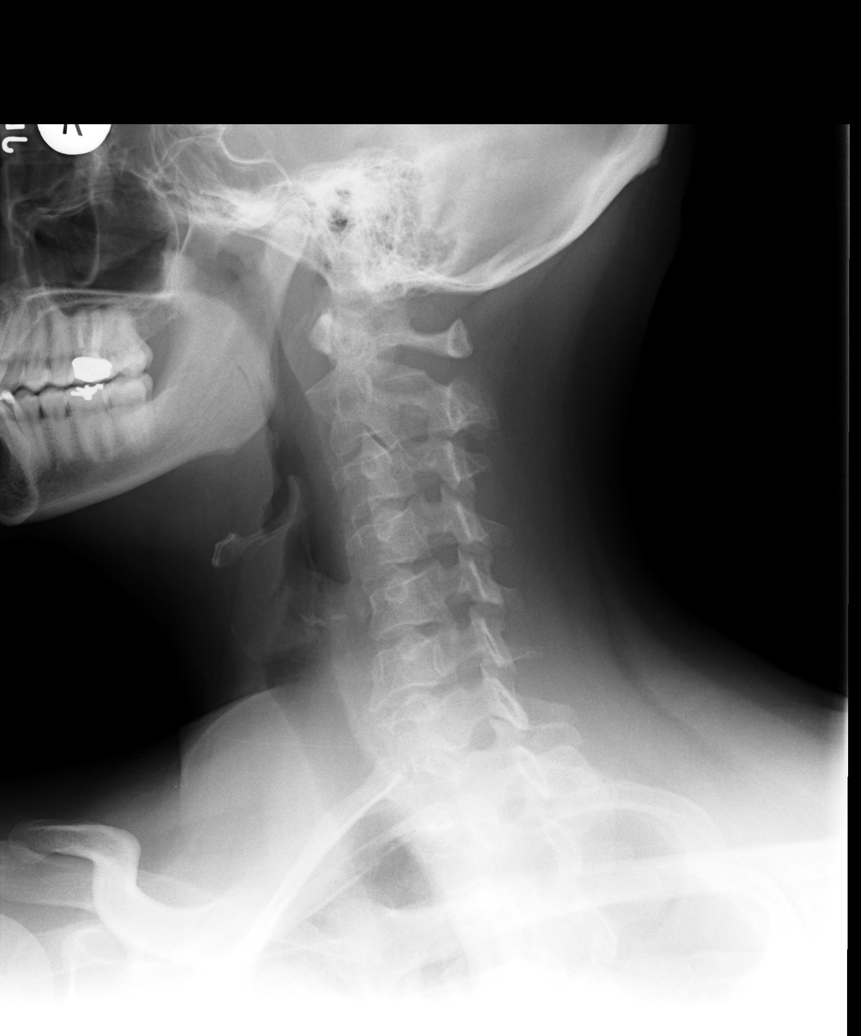

[view not recorded (4 of 6)]
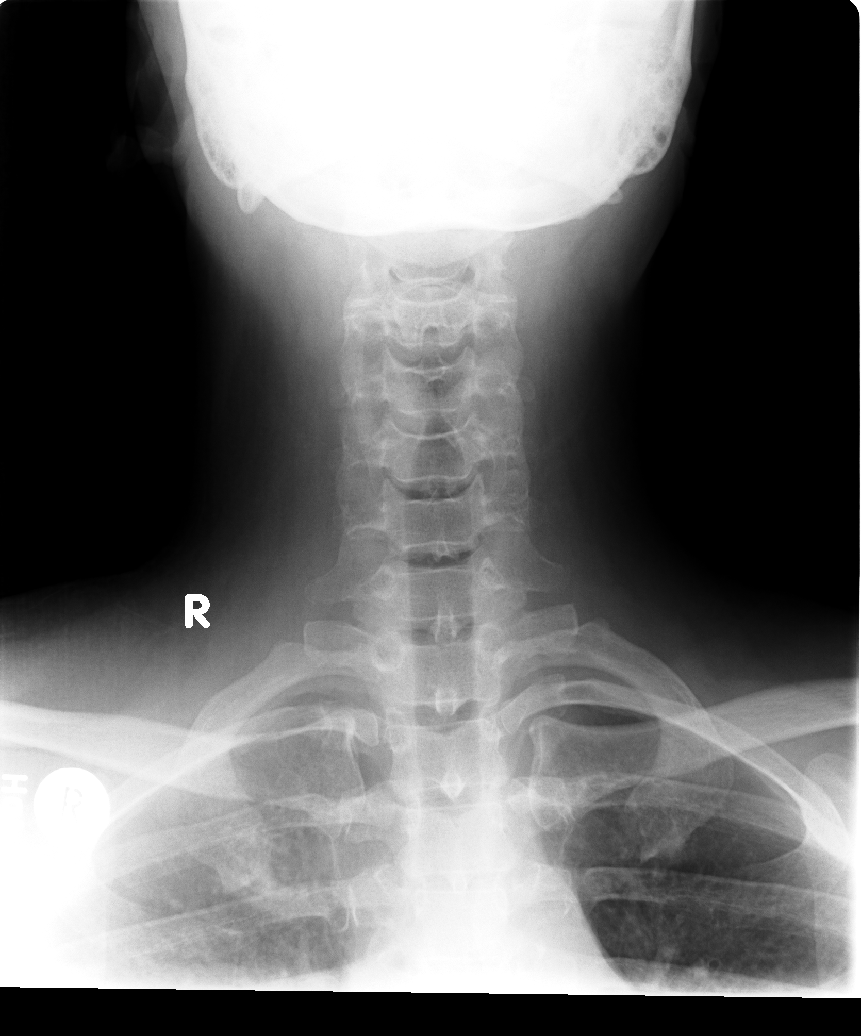

[view not recorded (5 of 6)]
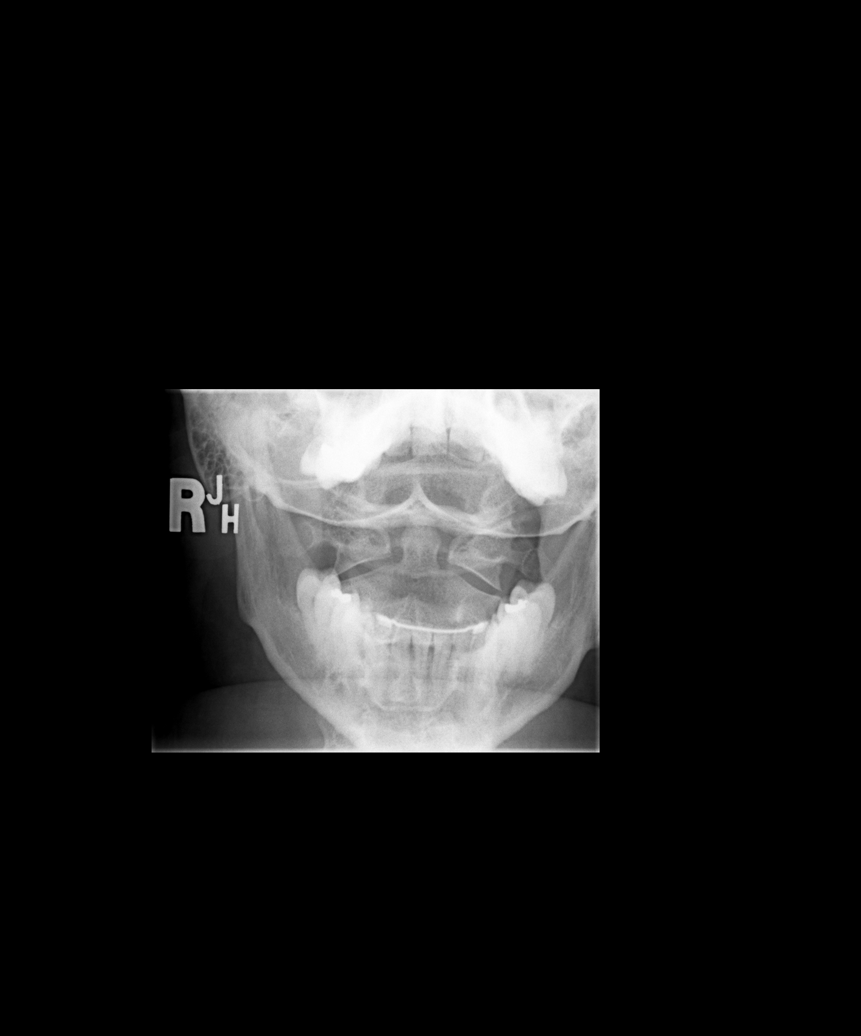

[view not recorded (6 of 6)]
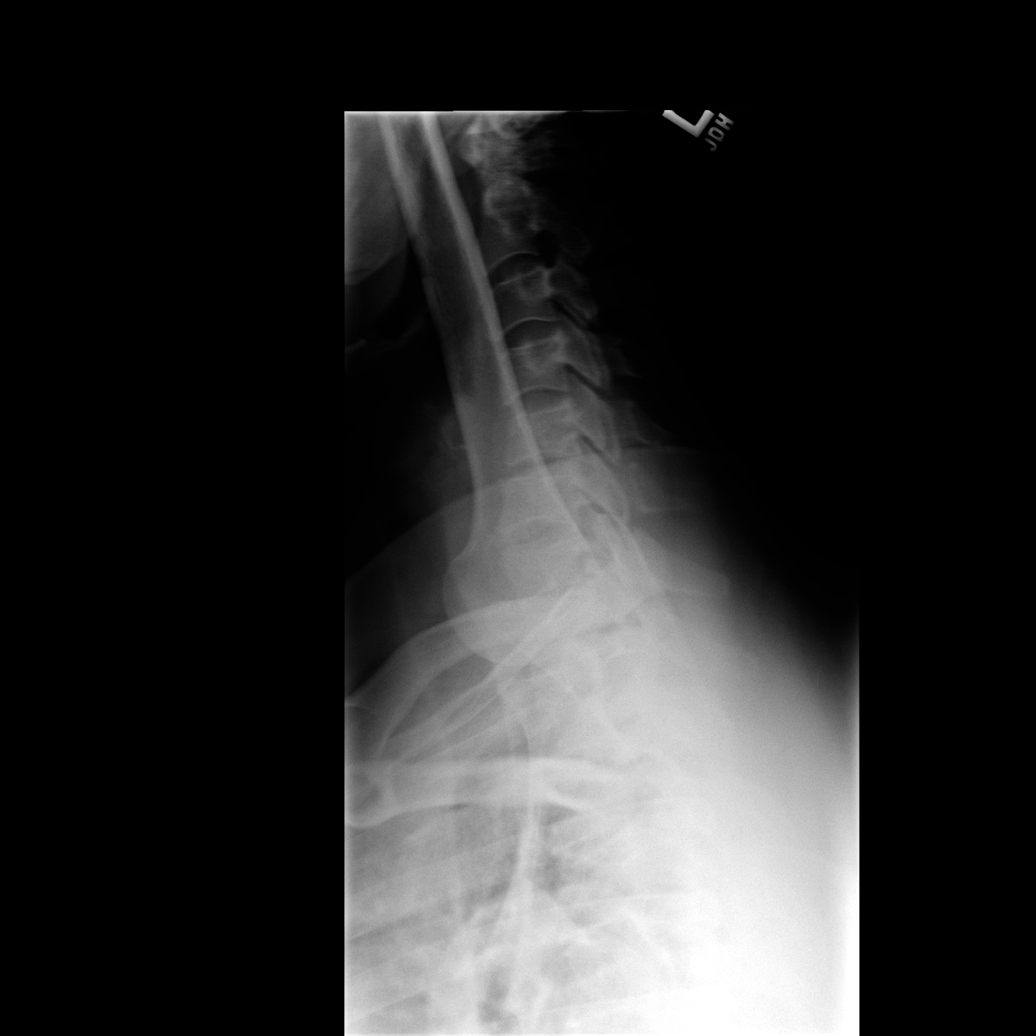

[6 of 6 positions shown; findings below may reference images not displayed]

FINDINGS: The cervical vertebral bodies are preserved in height. There is mild
disc space narrowing at C6-7. There is an anterior endplate
osteophyte at this level. There is mild facet joint hypertrophy at
C6-7. The oblique views reveal no high-grade bony encroachment upon
the neural foramina. The odontoid is intact. The prevertebral soft
tissue spaces are normal.
IMPRESSION: There is mild degenerative disc and facet joint change at C6-7. No
acute bony abnormality of the cervical spine is demonstrated.

## 2015-12-02 DIAGNOSIS — E1121 Type 2 diabetes mellitus with diabetic nephropathy: Secondary | ICD-10-CM | POA: Diagnosis not present

## 2015-12-02 DIAGNOSIS — J45901 Unspecified asthma with (acute) exacerbation: Secondary | ICD-10-CM | POA: Diagnosis not present

## 2015-12-02 DIAGNOSIS — Z23 Encounter for immunization: Secondary | ICD-10-CM | POA: Diagnosis not present

## 2016-02-10 ENCOUNTER — Other Ambulatory Visit: Payer: Self-pay | Admitting: Family Medicine

## 2016-02-10 DIAGNOSIS — K219 Gastro-esophageal reflux disease without esophagitis: Secondary | ICD-10-CM | POA: Diagnosis not present

## 2016-02-10 DIAGNOSIS — M549 Dorsalgia, unspecified: Secondary | ICD-10-CM | POA: Diagnosis not present

## 2016-02-10 DIAGNOSIS — K8020 Calculus of gallbladder without cholecystitis without obstruction: Secondary | ICD-10-CM | POA: Diagnosis not present

## 2016-02-10 DIAGNOSIS — R1013 Epigastric pain: Secondary | ICD-10-CM | POA: Diagnosis not present

## 2016-02-10 DIAGNOSIS — K279 Peptic ulcer, site unspecified, unspecified as acute or chronic, without hemorrhage or perforation: Secondary | ICD-10-CM | POA: Diagnosis not present

## 2016-02-10 DIAGNOSIS — R109 Unspecified abdominal pain: Secondary | ICD-10-CM

## 2016-02-11 ENCOUNTER — Ambulatory Visit
Admission: RE | Admit: 2016-02-11 | Discharge: 2016-02-11 | Disposition: A | Discharge status: Home / Self Care | Payer: BLUE CROSS/BLUE SHIELD | Source: Ambulatory Visit | Attending: Family Medicine | Admitting: Family Medicine

## 2016-02-11 DIAGNOSIS — R109 Unspecified abdominal pain: Secondary | ICD-10-CM

## 2016-02-11 DIAGNOSIS — K8020 Calculus of gallbladder without cholecystitis without obstruction: Secondary | ICD-10-CM | POA: Diagnosis not present

## 2016-02-12 DIAGNOSIS — K219 Gastro-esophageal reflux disease without esophagitis: Secondary | ICD-10-CM | POA: Diagnosis not present

## 2016-02-12 DIAGNOSIS — K8050 Calculus of bile duct without cholangitis or cholecystitis without obstruction: Secondary | ICD-10-CM | POA: Diagnosis not present

## 2016-02-12 DIAGNOSIS — Z23 Encounter for immunization: Secondary | ICD-10-CM | POA: Diagnosis not present

## 2016-02-27 ENCOUNTER — Ambulatory Visit: Payer: Self-pay | Admitting: General Surgery

## 2016-02-27 DIAGNOSIS — K8020 Calculus of gallbladder without cholecystitis without obstruction: Secondary | ICD-10-CM | POA: Diagnosis not present

## 2016-03-15 ENCOUNTER — Encounter (HOSPITAL_COMMUNITY): Payer: Self-pay

## 2016-03-16 ENCOUNTER — Other Ambulatory Visit (HOSPITAL_COMMUNITY): Payer: Self-pay | Admitting: *Deleted

## 2016-03-16 ENCOUNTER — Encounter (HOSPITAL_COMMUNITY)
Admission: RE | Admit: 2016-03-16 | Discharge: 2016-03-16 | Disposition: A | Discharge status: Home / Self Care | Payer: BLUE CROSS/BLUE SHIELD | Source: Ambulatory Visit | Attending: General Surgery | Admitting: General Surgery

## 2016-03-16 ENCOUNTER — Encounter (HOSPITAL_COMMUNITY): Payer: Self-pay

## 2016-03-16 DIAGNOSIS — F329 Major depressive disorder, single episode, unspecified: Secondary | ICD-10-CM | POA: Diagnosis not present

## 2016-03-16 DIAGNOSIS — Z6841 Body Mass Index (BMI) 40.0 and over, adult: Secondary | ICD-10-CM | POA: Diagnosis not present

## 2016-03-16 DIAGNOSIS — K8020 Calculus of gallbladder without cholecystitis without obstruction: Secondary | ICD-10-CM | POA: Diagnosis not present

## 2016-03-16 DIAGNOSIS — Z79899 Other long term (current) drug therapy: Secondary | ICD-10-CM | POA: Diagnosis not present

## 2016-03-16 DIAGNOSIS — I10 Essential (primary) hypertension: Secondary | ICD-10-CM | POA: Diagnosis not present

## 2016-03-16 DIAGNOSIS — Z880 Allergy status to penicillin: Secondary | ICD-10-CM | POA: Diagnosis not present

## 2016-03-16 DIAGNOSIS — K8010 Calculus of gallbladder with chronic cholecystitis without obstruction: Secondary | ICD-10-CM | POA: Diagnosis not present

## 2016-03-16 DIAGNOSIS — Z7984 Long term (current) use of oral hypoglycemic drugs: Secondary | ICD-10-CM | POA: Diagnosis not present

## 2016-03-16 DIAGNOSIS — E119 Type 2 diabetes mellitus without complications: Secondary | ICD-10-CM | POA: Diagnosis not present

## 2016-03-16 DIAGNOSIS — F419 Anxiety disorder, unspecified: Secondary | ICD-10-CM | POA: Diagnosis not present

## 2016-03-16 DIAGNOSIS — M1990 Unspecified osteoarthritis, unspecified site: Secondary | ICD-10-CM | POA: Diagnosis not present

## 2016-03-16 DIAGNOSIS — J45909 Unspecified asthma, uncomplicated: Secondary | ICD-10-CM | POA: Diagnosis not present

## 2016-03-16 HISTORY — DX: Restless legs syndrome: G25.81

## 2016-03-16 HISTORY — DX: Unspecified osteoarthritis, unspecified site: M19.90

## 2016-03-16 LAB — CBC
HCT: 46.3 % — ABNORMAL HIGH (ref 36.0–46.0)
Hemoglobin: 15.5 g/dL — ABNORMAL HIGH (ref 12.0–15.0)
MCH: 29.6 pg (ref 26.0–34.0)
MCHC: 33.5 g/dL (ref 30.0–36.0)
MCV: 88.5 fL (ref 78.0–100.0)
Platelets: 167 10*3/uL (ref 150–400)
RBC: 5.23 MIL/uL — ABNORMAL HIGH (ref 3.87–5.11)
RDW: 13.5 % (ref 11.5–15.5)
WBC: 4.4 10*3/uL (ref 4.0–10.5)

## 2016-03-16 LAB — BASIC METABOLIC PANEL
Anion gap: 7 (ref 5–15)
BUN: 14 mg/dL (ref 6–20)
CO2: 24 mmol/L (ref 22–32)
Calcium: 9.3 mg/dL (ref 8.9–10.3)
Chloride: 108 mmol/L (ref 101–111)
Creatinine, Ser: 0.69 mg/dL (ref 0.44–1.00)
GFR calc Af Amer: >60 mL/min (ref >60)
GFR calc non Af Amer: >60 mL/min (ref >60)
Glucose, Bld: 121 mg/dL — ABNORMAL HIGH (ref 65–99)
Potassium: 3.9 mmol/L (ref 3.5–5.1)
Sodium: 139 mmol/L (ref 135–145)

## 2016-03-16 LAB — HCG, SERUM, QUALITATIVE: Preg, Serum: NEGATIVE

## 2016-03-16 LAB — GLUCOSE, CAPILLARY: Glucose-Capillary: 132 mg/dL — ABNORMAL HIGH (ref 65–99)

## 2016-03-16 NOTE — Progress Notes (Signed)
Pt denies cardiac history, chest pain or sob. Pt is diabetic. Last A1C per pt was in July, 2017 and it was 5.2, she states she does not check her blood sugar at home. CBG today was 132 and it was fasting.

## 2016-03-16 NOTE — Pre-Procedure Instructions (Addendum)
Natalie Kim  03/16/2016     Your procedure is scheduled on Thursday, March 18, 2016 at 11:00 AM.   [facility] at 9:00 AM.   Call this number if you have problems the morning of surgery: [phone]   Questions prior to day of surgery, please call [phone] between 8 & 4 PM.   Remember:  Do not eat food or drink liquids after midnight Wednesday, 03/17/16.  Take these medicines the morning of surgery with A SIP OF WATER: Escitalopram (Lexapro)  How to Manage Your Diabetes Before Surgery   Why is it important to control my blood sugar before and after surgery?   Improving blood sugar levels before and after surgery helps healing and can limit problems.  A way of improving blood sugar control is eating a healthy diet by:  - Eating less sugar and carbohydrates  - Increasing activity/exercise  - Talk with your doctor about reaching your blood sugar goals  High blood sugars (greater than 180 mg/dL) can raise your risk of infections and slow down your recovery so you will need to focus on controlling your diabetes during the weeks before surgery.  Make sure that the doctor who takes care of your diabetes knows about your planned surgery including the date and location.  How do I manage my blood sugars before surgery?   Check your blood sugar at least 4 times a day, 2 days before surgery to make sure that they are not too high or low.  Check your blood sugar the morning of your surgery when you wake up and every 2 hours until you get to the Short-Stay unit.  Treat a low blood sugar (less than 70 mg/dL) with 1/2 cup of clear juice (cranberry or apple), 4 glucose tablets, OR glucose gel.  Recheck blood sugar in 15 minutes after treatment (to make sure it is greater than 70 mg/dL).  If blood sugar is not greater than 70 mg/dL on re-check, call [phone] for further instructions.   Report your blood sugar to the  Short-Stay nurse when you get to Short-Stay.  References:  [facility], 2007 "How to Manage your Diabetes Before and After Surgery".  What do I do about my diabetes medications?   Do not take oral diabetes medicines (pills) the morning of surgery.   Do not wear jewelry, make-up or nail polish.  Do not wear lotions, powders, or perfumes.  Men may shave face and neck.  Do not bring valuables to the hospital.  [facility] any belongings or valuables.  Contacts, dentures or bridgework may not be worn into surgery.  Leave your suitcase in the car.  After surgery it may be brought to your room.  For patients admitted to the hospital, discharge time will be determined by your treatment team.  Patients discharged the day of surgery will not be allowed to drive home.   Special instructions:  [facility] - Preparing for Surgery  Before surgery, you can play an important role.  Because skin is not sterile, your skin needs to be as free of germs as possible.  You can reduce the number of germs on you skin by washing with CHG (chlorahexidine gluconate) soap before surgery.  CHG is an antiseptic cleaner which kills germs and bonds with the skin to continue killing germs even after washing.  Please DO NOT use if you have an allergy to CHG or  antibacterial soaps.  If your skin becomes reddened/irritated stop using the CHG and inform your nurse when you arrive at Short Stay.  Do not shave (including legs and underarms) for at least 48 hours prior to the first CHG shower.  You may shave your face.  Please follow these instructions carefully:   1.  Shower with CHG Soap the night before surgery and the                    morning of Surgery.  2.  If you choose to wash your hair, wash your hair first as usual with your       normal shampoo.  3.  After you shampoo, rinse your hair and body thoroughly to remove the shampoo.  4.  Use CHG as you would any  other liquid soap.  You can apply chg directly       to the skin and wash gently with scrungie or a clean washcloth.  5.  Apply the CHG Soap to your body ONLY FROM THE NECK DOWN.        Do not use on open wounds or open sores.  Avoid contact with your eyes, ears, mouth and genitals (private parts).  Wash genitals (private parts) with your normal soap.  6.  Wash thoroughly, paying special attention to the area where your surgery        will be performed.  7.  Thoroughly rinse your body with warm water from the neck down.  8.  DO NOT shower/wash with your normal soap after using and rinsing off       the CHG Soap.  9.  Pat yourself dry with a clean towel.            10.  Wear clean pajamas.            11.  Place clean sheets on your bed the night of your first shower and do not        sleep with pets.  Day of Surgery  Do not apply any lotions the morning of surgery.  Please wear clean clothes to the hospital.  Please read over the following fact sheets that you were given.

## 2016-03-18 ENCOUNTER — Ambulatory Visit (HOSPITAL_COMMUNITY): Payer: BLUE CROSS/BLUE SHIELD

## 2016-03-18 ENCOUNTER — Ambulatory Visit (HOSPITAL_COMMUNITY)
Admission: RE | Admit: 2016-03-18 | Discharge: 2016-03-18 | Disposition: A | Discharge status: Home / Self Care | Payer: BLUE CROSS/BLUE SHIELD | Source: Ambulatory Visit | Attending: General Surgery | Admitting: General Surgery

## 2016-03-18 ENCOUNTER — Encounter (HOSPITAL_COMMUNITY)
Admission: RE | Disposition: A | Discharge status: Home / Self Care | Payer: Self-pay | Source: Ambulatory Visit | Attending: General Surgery

## 2016-03-18 ENCOUNTER — Encounter (HOSPITAL_COMMUNITY): Payer: Self-pay | Admitting: Certified Registered Nurse Anesthetist

## 2016-03-18 ENCOUNTER — Ambulatory Visit (HOSPITAL_COMMUNITY): Payer: BLUE CROSS/BLUE SHIELD | Admitting: Certified Registered Nurse Anesthetist

## 2016-03-18 DIAGNOSIS — Z7984 Long term (current) use of oral hypoglycemic drugs: Secondary | ICD-10-CM | POA: Insufficient documentation

## 2016-03-18 DIAGNOSIS — K824 Cholesterolosis of gallbladder: Secondary | ICD-10-CM | POA: Diagnosis not present

## 2016-03-18 DIAGNOSIS — E119 Type 2 diabetes mellitus without complications: Secondary | ICD-10-CM | POA: Diagnosis not present

## 2016-03-18 DIAGNOSIS — F419 Anxiety disorder, unspecified: Secondary | ICD-10-CM | POA: Diagnosis not present

## 2016-03-18 DIAGNOSIS — M1990 Unspecified osteoarthritis, unspecified site: Secondary | ICD-10-CM | POA: Insufficient documentation

## 2016-03-18 DIAGNOSIS — J45909 Unspecified asthma, uncomplicated: Secondary | ICD-10-CM | POA: Insufficient documentation

## 2016-03-18 DIAGNOSIS — K8020 Calculus of gallbladder without cholecystitis without obstruction: Secondary | ICD-10-CM | POA: Diagnosis not present

## 2016-03-18 DIAGNOSIS — K8010 Calculus of gallbladder with chronic cholecystitis without obstruction: Secondary | ICD-10-CM | POA: Diagnosis not present

## 2016-03-18 DIAGNOSIS — Z79899 Other long term (current) drug therapy: Secondary | ICD-10-CM | POA: Insufficient documentation

## 2016-03-18 DIAGNOSIS — I10 Essential (primary) hypertension: Secondary | ICD-10-CM | POA: Insufficient documentation

## 2016-03-18 DIAGNOSIS — F329 Major depressive disorder, single episode, unspecified: Secondary | ICD-10-CM | POA: Diagnosis not present

## 2016-03-18 DIAGNOSIS — Z6841 Body Mass Index (BMI) 40.0 and over, adult: Secondary | ICD-10-CM | POA: Insufficient documentation

## 2016-03-18 DIAGNOSIS — Z880 Allergy status to penicillin: Secondary | ICD-10-CM | POA: Diagnosis not present

## 2016-03-18 HISTORY — PX: CHOLECYSTECTOMY: SHX55

## 2016-03-18 LAB — GLUCOSE, CAPILLARY
Glucose-Capillary: 126 mg/dL — ABNORMAL HIGH (ref 65–99)
Glucose-Capillary: 136 mg/dL — ABNORMAL HIGH (ref 65–99)

## 2016-03-18 SURGERY — LAPAROSCOPIC CHOLECYSTECTOMY WITH INTRAOPERATIVE CHOLANGIOGRAM
Anesthesia: General | Site: Abdomen

## 2016-03-18 MED ORDER — CHLORHEXIDINE GLUCONATE CLOTH 2 % EX PADS: 6.0000 | MEDICATED_PAD | Freq: Once | CUTANEOUS | Status: DC

## 2016-03-18 MED ORDER — HYDROMORPHONE HCL 1 MG/ML IJ SOLN
0.2500 mg | INTRAMUSCULAR | Status: DC | PRN
  Administered 2016-03-18: 0.25 mg via INTRAVENOUS

## 2016-03-18 MED ORDER — PROMETHAZINE HCL 25 MG/ML IJ SOLN
6.2500 mg | Freq: Once | INTRAMUSCULAR | Status: AC
  Administered 2016-03-18: 6.25 mg via INTRAVENOUS

## 2016-03-18 MED ORDER — EPHEDRINE SULFATE-NACL 50-0.9 MG/10ML-% IV SOSY
PREFILLED_SYRINGE | INTRAVENOUS | Status: DC | PRN
  Administered 2016-03-18 (×2): 15 mg via INTRAVENOUS
  Administered 2016-03-18: 10 mg via INTRAVENOUS

## 2016-03-18 MED ORDER — 0.9 % SODIUM CHLORIDE (POUR BTL) OPTIME
TOPICAL | Status: DC | PRN
  Administered 2016-03-18: 1000 mL

## 2016-03-18 MED ORDER — EPHEDRINE 5 MG/ML INJ
INTRAVENOUS | Status: AC
  Filled 2016-03-18: qty 10

## 2016-03-18 MED ORDER — BUPIVACAINE HCL (PF) 0.25 % IJ SOLN
INTRAMUSCULAR | Status: AC
  Filled 2016-03-18: qty 30

## 2016-03-18 MED ORDER — LACTATED RINGERS IV SOLN
INTRAVENOUS | Status: DC | PRN
  Administered 2016-03-18 (×2): via INTRAVENOUS

## 2016-03-18 MED ORDER — MIDAZOLAM HCL 2 MG/2ML IJ SOLN
INTRAMUSCULAR | Status: AC
  Filled 2016-03-18: qty 2

## 2016-03-18 MED ORDER — ROCURONIUM BROMIDE 10 MG/ML (PF) SYRINGE
PREFILLED_SYRINGE | INTRAVENOUS | Status: AC
  Filled 2016-03-18: qty 10

## 2016-03-18 MED ORDER — HYDROMORPHONE HCL 2 MG/ML IJ SOLN
INTRAMUSCULAR | Status: AC
  Filled 2016-03-18: qty 1

## 2016-03-18 MED ORDER — PHENYLEPHRINE 40 MCG/ML (10ML) SYRINGE FOR IV PUSH (FOR BLOOD PRESSURE SUPPORT)
PREFILLED_SYRINGE | INTRAVENOUS | Status: AC
  Filled 2016-03-18: qty 10

## 2016-03-18 MED ORDER — ONDANSETRON HCL 4 MG/2ML IJ SOLN
INTRAMUSCULAR | Status: AC
  Filled 2016-03-18: qty 2

## 2016-03-18 MED ORDER — IOPAMIDOL (ISOVUE-300) INJECTION 61%
INTRAVENOUS | Status: AC
  Filled 2016-03-18: qty 50

## 2016-03-18 MED ORDER — PROMETHAZINE HCL 25 MG/ML IJ SOLN
INTRAMUSCULAR | Status: AC
  Administered 2016-03-18: 6.25 mg via INTRAVENOUS
  Filled 2016-03-18: qty 1

## 2016-03-18 MED ORDER — SODIUM CHLORIDE 0.9 % IR SOLN
Status: DC | PRN
  Administered 2016-03-18: 1000 mL

## 2016-03-18 MED ORDER — CEFAZOLIN SODIUM-DEXTROSE 2-4 GM/100ML-% IV SOLN
2.0000 g | INTRAVENOUS | Status: AC
  Administered 2016-03-18: 2 g via INTRAVENOUS
  Filled 2016-03-18: qty 100

## 2016-03-18 MED ORDER — PROMETHAZINE HCL 25 MG/ML IJ SOLN: 6.2500 mg | INTRAMUSCULAR | Status: DC | PRN

## 2016-03-18 MED ORDER — PHENYLEPHRINE HCL 10 MG/ML IJ SOLN
INTRAMUSCULAR | Status: AC
  Filled 2016-03-18: qty 1

## 2016-03-18 MED ORDER — MIDAZOLAM HCL 5 MG/5ML IJ SOLN
INTRAMUSCULAR | Status: DC | PRN
  Administered 2016-03-18: 2 mg via INTRAVENOUS

## 2016-03-18 MED ORDER — FENTANYL CITRATE (PF) 100 MCG/2ML IJ SOLN
INTRAMUSCULAR | Status: AC
  Filled 2016-03-18: qty 4

## 2016-03-18 MED ORDER — ONDANSETRON HCL 4 MG/2ML IJ SOLN
INTRAMUSCULAR | Status: DC | PRN
  Administered 2016-03-18: 4 mg via INTRAVENOUS

## 2016-03-18 MED ORDER — SUGAMMADEX SODIUM 200 MG/2ML IV SOLN
INTRAVENOUS | Status: DC | PRN
  Administered 2016-03-18: 400 mg via INTRAVENOUS

## 2016-03-18 MED ORDER — PROPOFOL 10 MG/ML IV BOLUS
INTRAVENOUS | Status: AC
  Filled 2016-03-18: qty 20

## 2016-03-18 MED ORDER — SODIUM CHLORIDE 0.9 % IV SOLN
INTRAVENOUS | Status: DC | PRN
  Administered 2016-03-18: 6 mL

## 2016-03-18 MED ORDER — PROPOFOL 10 MG/ML IV BOLUS
INTRAVENOUS | Status: DC | PRN
  Administered 2016-03-18: 180 mg via INTRAVENOUS

## 2016-03-18 MED ORDER — BUPIVACAINE HCL (PF) 0.25 % IJ SOLN
INTRAMUSCULAR | Status: DC | PRN
Start: 2016-03-18 — End: 2016-03-18
  Administered 2016-03-18: 25 mL

## 2016-03-18 MED ORDER — PHENYLEPHRINE 40 MCG/ML (10ML) SYRINGE FOR IV PUSH (FOR BLOOD PRESSURE SUPPORT)
PREFILLED_SYRINGE | INTRAVENOUS | Status: DC | PRN
  Administered 2016-03-18: 120 ug via INTRAVENOUS
  Administered 2016-03-18: 80 ug via INTRAVENOUS
  Administered 2016-03-18 (×2): 40 ug via INTRAVENOUS
  Administered 2016-03-18 (×2): 80 ug via INTRAVENOUS
  Administered 2016-03-18: 120 ug via INTRAVENOUS

## 2016-03-18 MED ORDER — LACTATED RINGERS IV SOLN
INTRAVENOUS | Status: DC
  Administered 2016-03-18: 10:00:00 via INTRAVENOUS

## 2016-03-18 MED ORDER — SUGAMMADEX SODIUM 500 MG/5ML IV SOLN
INTRAVENOUS | Status: AC
  Filled 2016-03-18: qty 5

## 2016-03-18 MED ORDER — ROCURONIUM BROMIDE 10 MG/ML (PF) SYRINGE
PREFILLED_SYRINGE | INTRAVENOUS | Status: DC | PRN
  Administered 2016-03-18: 20 mg via INTRAVENOUS
  Administered 2016-03-18: 40 mg via INTRAVENOUS

## 2016-03-18 MED ORDER — HYDROCODONE-ACETAMINOPHEN 5-325 MG PO TABS: 1.0000 | ORAL_TABLET | ORAL | 0 refills | Status: DC | PRN

## 2016-03-18 MED ORDER — FENTANYL CITRATE (PF) 100 MCG/2ML IJ SOLN
INTRAMUSCULAR | Status: DC | PRN
  Administered 2016-03-18 (×4): 50 ug via INTRAVENOUS

## 2016-03-18 MED ORDER — HYDROMORPHONE HCL 1 MG/ML IJ SOLN: 0.2500 mg | INTRAMUSCULAR | Status: DC | PRN

## 2016-03-18 MED ORDER — LIDOCAINE 2% (20 MG/ML) 5 ML SYRINGE
INTRAMUSCULAR | Status: AC
  Filled 2016-03-18: qty 5

## 2016-03-18 MED ORDER — LIDOCAINE 2% (20 MG/ML) 5 ML SYRINGE
INTRAMUSCULAR | Status: DC | PRN
Start: 2016-03-18 — End: 2016-03-18
  Administered 2016-03-18: 60 mg via INTRAVENOUS

## 2016-03-18 SURGICAL SUPPLY — 44 items
ADH SKN CLS APL DERMABOND .7 (GAUZE/BANDAGES/DRESSINGS) ×1
APPLIER CLIP 5 13 M/L LIGAMAX5 (MISCELLANEOUS) ×2
APR CLP MED LRG 5 ANG JAW (MISCELLANEOUS) ×1
BAG SPEC RTRVL LRG 6X4 10 (ENDOMECHANICALS) ×1
BLADE SURG ROTATE 9660 (MISCELLANEOUS) IMPLANT
CANISTER SUCTION 2500CC (MISCELLANEOUS) ×2 IMPLANT
CATH REDDICK CHOLANGI 4FR 50CM (CATHETERS) ×2 IMPLANT
CHLORAPREP W/TINT 26ML (MISCELLANEOUS) ×2 IMPLANT
CLIP APPLIE 5 13 M/L LIGAMAX5 (MISCELLANEOUS) ×1 IMPLANT
COVER MAYO STAND STRL (DRAPES) ×2 IMPLANT
COVER SURGICAL LIGHT HANDLE (MISCELLANEOUS) ×2 IMPLANT
DERMABOND ADVANCED (GAUZE/BANDAGES/DRESSINGS) ×1
DERMABOND ADVANCED .7 DNX12 (GAUZE/BANDAGES/DRESSINGS) ×1 IMPLANT
DRAPE C-ARM 42X72 X-RAY (DRAPES) ×2 IMPLANT
ELECT REM PT RETURN 9FT ADLT (ELECTROSURGICAL) ×2
ELECTRODE REM PT RTRN 9FT ADLT (ELECTROSURGICAL) ×1 IMPLANT
GLOVE BIO SURGEON STRL SZ7.5 (GLOVE) ×1 IMPLANT
GLOVE BIOGEL PI IND STRL 6.5 (GLOVE) IMPLANT
GLOVE BIOGEL PI INDICATOR 6.5 (GLOVE) ×1
GLOVE SURG SS PI 6.0 STRL IVOR (GLOVE) ×1 IMPLANT
GLOVE SURG SS PI 7.5 STRL IVOR (GLOVE) ×1 IMPLANT
GLOVE SURG SS PI 8.0 STRL IVOR (GLOVE) ×1 IMPLANT
GOWN STRL REUS W/ TWL LRG LVL3 (GOWN DISPOSABLE) ×3 IMPLANT
GOWN STRL REUS W/ TWL XL LVL3 (GOWN DISPOSABLE) IMPLANT
GOWN STRL REUS W/TWL LRG LVL3 (GOWN DISPOSABLE) ×8
GOWN STRL REUS W/TWL XL LVL3 (GOWN DISPOSABLE) ×2
IV CATH 14GX2 1/4 (CATHETERS) ×2 IMPLANT
KIT BASIN OR (CUSTOM PROCEDURE TRAY) ×2 IMPLANT
KIT ROOM TURNOVER OR (KITS) ×2 IMPLANT
LIQUID BAND (GAUZE/BANDAGES/DRESSINGS) ×1 IMPLANT
NS IRRIG 1000ML POUR BTL (IV SOLUTION) ×2 IMPLANT
PAD ARMBOARD 7.5X6 YLW CONV (MISCELLANEOUS) ×2 IMPLANT
POUCH SPECIMEN RETRIEVAL 10MM (ENDOMECHANICALS) ×2 IMPLANT
SCISSORS LAP 5X35 DISP (ENDOMECHANICALS) ×2 IMPLANT
SET IRRIG TUBING LAPAROSCOPIC (IRRIGATION / IRRIGATOR) ×2 IMPLANT
SLEEVE ENDOPATH XCEL 5M (ENDOMECHANICALS) ×4 IMPLANT
SPECIMEN JAR SMALL (MISCELLANEOUS) ×2 IMPLANT
SUT MNCRL AB 4-0 PS2 18 (SUTURE) ×2 IMPLANT
TOWEL OR 17X24 6PK STRL BLUE (TOWEL DISPOSABLE) ×1 IMPLANT
TOWEL OR 17X26 10 PK STRL BLUE (TOWEL DISPOSABLE) ×2 IMPLANT
TRAY LAPAROSCOPIC MC (CUSTOM PROCEDURE TRAY) ×2 IMPLANT
TROCAR XCEL BLUNT TIP 100MML (ENDOMECHANICALS) ×2 IMPLANT
TROCAR XCEL NON-BLD 5MMX100MML (ENDOMECHANICALS) ×2 IMPLANT
TUBING INSUFFLATION (TUBING) ×2 IMPLANT

## 2016-03-18 NOTE — Anesthesia Procedure Notes (Signed)
Procedure Name: Intubation Date/Time: 03/18/2016 10:52 AM Performed by: Rise PatienceBELL, Takyah Ciaramitaro T Pre-anesthesia Checklist: Patient identified, Emergency Drugs available, Suction available and Patient being monitored Patient Re-evaluated:Patient Re-evaluated prior to inductionOxygen Delivery Method: Circle System Utilized Preoxygenation: Pre-oxygenation with 100% oxygen Intubation Type: IV induction Ventilation: Mask ventilation without difficulty Laryngoscope Size: Miller and 2 Grade View: Grade I Tube type: Oral Tube size: 7.5 mm Number of attempts: 1 Airway Equipment and Method: Stylet and Oral airway Placement Confirmation: ETT inserted through vocal cords under direct vision,  positive ETCO2 and breath sounds checked- equal and bilateral Secured at: 22 cm Tube secured with: Tape Dental Injury: Teeth and Oropharynx as per pre-operative assessment

## 2016-03-18 NOTE — H&P (Signed)
Natalie Kim  [facility] Patient #: [id number] DOB: 06-10-1970 Single / Language: [place] / Race: White Female   History of Present Illness  Patient words: gb.  The patient is a 45 year old female who presents with abdominal pain. We are asked to see the patient in consultation by Dr. Maryelizabeth RowanElizabeth Dewey to evaluate her for gallstones. The patient is a 45 year old white female who has been having right upper quadrant pain that radiates into her back for greater than a year. The pain has become more constant in nature. The pain is associated with nausea at times. She denies any fevers or chills. Her recent ultrasound showed stones in the gallbladder with a 1 cm stone impacted at the neck but no gallbladder wall thickening or ductal dilatation. No liver functions have been checked in over a year.   Other Problems  Anxiety Disorder Back Pain Cholelithiasis Depression Diabetes Mellitus High blood pressure  Past Surgical History Cesarean Section - 1 Oral Surgery  Diagnostic Studies History  Colonoscopy never Mammogram 1-3 years ago  Allergies  Penicillin G Pot in Dextrose *PENICILLINS*  Medication History  Losartan Potassium (50MG  Tablet, Oral) Active. MetFORMIN HCl (500MG  Tablet, Oral) Active. Escitalopram Oxalate (20MG  Tablet, Oral) Active. Medications Reconciled  Social History  Alcohol use Occasional alcohol use. No caffeine use No drug use Tobacco use Never smoker.  Family History Hypertension Mother. Prostate Cancer Father.  Pregnancy / Birth History  Age at menarche 15 years. Contraceptive History Oral contraceptives. Gravida 2 Maternal age 43-25 Para 2 Regular periods    Review of Systems  General Present- Fatigue. Not Present- Appetite Loss, Chills, Fever, Night Sweats, Weight Gain and Weight Loss. Skin Present- Dryness. Not Present- Change in Wart/Mole, Hives, Jaundice, New Lesions, Non-Healing  Wounds, Rash and Ulcer. HEENT Present- Seasonal Allergies and Wears glasses/contact lenses. Not Present- Earache, Hearing Loss, Hoarseness, Nose Bleed, Oral Ulcers, Ringing in the Ears, Sinus Pain, Sore Throat, Visual Disturbances and Yellow Eyes. Respiratory Not Present- Bloody sputum, Chronic Cough, Difficulty Breathing, Snoring and Wheezing. Breast Not Present- Breast Mass, Breast Pain, Nipple Discharge and Skin Changes. Cardiovascular Not Present- Chest Pain, Difficulty Breathing Lying Down, Leg Cramps, Palpitations, Rapid Heart Rate, Shortness of Breath and Swelling of Extremities. Gastrointestinal Present- Excessive gas. Not Present- Abdominal Pain, Bloating, Bloody Stool, Change in Bowel Habits, Chronic diarrhea, Constipation, Difficulty Swallowing, Gets full quickly at meals, Hemorrhoids, Indigestion, Nausea, Rectal Pain and Vomiting. Female Genitourinary Not Present- Frequency, Nocturia, Painful Urination, Pelvic Pain and Urgency. Musculoskeletal Present- Back Pain. Not Present- Joint Pain, Joint Stiffness, Muscle Pain, Muscle Weakness and Swelling of Extremities. Neurological Not Present- Decreased Memory, Fainting, Headaches, Numbness, Seizures, Tingling, Tremor, Trouble walking and Weakness. Psychiatric Present- Anxiety and Depression. Not Present- Bipolar, Change in Sleep Pattern, Fearful and Frequent crying.  Vitals Weight: 223.38 lb Height: 62in Body Surface Area: 2 m Body Mass Index: 40.86 kg/m  BP: 114/72 (Sitting, Left Arm, Standard)       Physical Exam General Mental Status-Alert. General Appearance-Consistent with stated age. Hydration-Well hydrated. Voice-Normal.  Head and Neck Head-normocephalic, atraumatic with no lesions or palpable masses. Trachea-midline. Thyroid Gland Characteristics - normal size and consistency.  Eye Eyeball - Bilateral-Extraocular movements intact. Sclera/Conjunctiva - Bilateral-No scleral icterus.  Chest  and Lung Exam Chest and lung exam reveals -quiet, even and easy respiratory effort with no use of accessory muscles and on auscultation, normal breath sounds, no adventitious sounds and normal vocal resonance. Inspection Chest Wall - Normal. Back - normal.  Cardiovascular Cardiovascular  examination reveals -normal heart sounds, regular rate and rhythm with no murmurs and normal pedal pulses bilaterally.  Abdomen Note: The abdomen is soft. There is mild-to-moderate tenderness in the right upper quadrant. There is no palpable mass.   Neurologic Neurologic evaluation reveals -alert and oriented x 3 with no impairment of recent or remote memory. Mental Status-Normal.  Musculoskeletal Normal Exam - Left-Upper Extremity Strength Normal and Lower Extremity Strength Normal. Normal Exam - Right-Upper Extremity Strength Normal and Lower Extremity Strength Normal.  Lymphatic Head & Neck  General Head & Neck Lymphatics: Bilateral - Description - Normal. Axillary  General Axillary Region: Bilateral - Description - Normal. Tenderness - Non Tender. Femoral & Inguinal  Generalized Femoral & Inguinal Lymphatics: Bilateral - Description - Normal. Tenderness - Non Tender.    Assessment & Plan GALLSTONES (K80.20) Impression: The patient appears to have symptomatic gallstones with a stone lodged in the neck of the gallbladder. Because of the risk of further painful episodes and possible pancreatitis I think she would benefit from having the gallbladder removed. She would also like to have this done. I have discussed with her in detail the risks and benefits of the operation to remove the gallbladder as well as some of the technical aspects and she understands and wishes to proceed. I will plan for a laparoscopic cholecystectomy with intraoperative cholangiogram Current Plans Pt Education - Gallstones: discussed with patient and provided information.

## 2016-03-18 NOTE — Anesthesia Preprocedure Evaluation (Addendum)
Anesthesia Evaluation  Patient identified by MRN, date of birth, ID band Patient awake    Reviewed: Allergy & Precautions, NPO status , Patient's Chart, lab work & pertinent test results  Airway Mallampati: I  TM Distance: >3 FB Neck ROM: Full    Dental  (+) Dental Advisory Given, Teeth Intact   Pulmonary asthma ,    breath sounds clear to auscultation       Cardiovascular hypertension, Pt. on medications  Rhythm:Regular Rate:Normal     Neuro/Psych PSYCHIATRIC DISORDERS Depression    GI/Hepatic   Endo/Other  diabetes, Type obesity  Renal/GU      Musculoskeletal  (+) Arthritis ,   Abdominal   Peds  Hematology   Anesthesia Other Findings   Reproductive/Obstetrics                          Anesthesia Physical Anesthesia Plan  ASA: II  Anesthesia Plan: General   Post-op Pain Management:    Induction: Intravenous  Airway Management Planned: Oral ETT  Additional Equipment:   Intra-op Plan:   Post-operative Plan: Extubation in OR  Informed Consent: I have reviewed the patients History and Physical, chart, labs and discussed the procedure including the risks, benefits and alternatives for the proposed anesthesia with the patient or authorized representative who has indicated his/her understanding and acceptance.   Dental advisory given  Plan Discussed with: CRNA, Anesthesiologist and Surgeon  Anesthesia Plan Comments:         Anesthesia Quick Evaluation

## 2016-03-18 NOTE — Op Note (Signed)
03/18/2016  12:07 PM  PATIENT:  Natalie CrawfordKimberly L Beecham  45 y.o. female  PRE-OPERATIVE DIAGNOSIS:  gallstones  POST-OPERATIVE DIAGNOSIS:  gallstones  PROCEDURE:  Procedure(s): LAPAROSCOPIC CHOLECYSTECTOMY WITH INTRAOPERATIVE CHOLANGIOGRAM (N/A)  SURGEON:  Surgeon(s) and Role:    * Griselda MinerPaul Toth III, MD - Primary  PHYSICIAN ASSISTANT:   ASSISTANTS: none   ANESTHESIA:   general  EBL:  No intake/output data recorded.  BLOOD ADMINISTERED:none  DRAINS: none   LOCAL MEDICATIONS USED:  MARCAINE     SPECIMEN:  Source of Specimen:  gallbladder  DISPOSITION OF SPECIMEN:  PATHOLOGY  COUNTS:  YES  TOURNIQUET:  * No tourniquets in log *  DICTATION: .Dragon Dictation   Procedure: After informed consent was obtained the patient was brought to the operating room and placed in the supine position on the operating room table. After adequate induction of general anesthesia the patient's abdomen was prepped with ChloraPrep allowed to dry and draped in usual sterile manner. An appropriate timeout was performed. The area below the umbilicus was infiltrated with quarter percent  Marcaine. A small incision was made with a 15 blade knife. The incision was carried down through the subcutaneous tissue bluntly with a hemostat and [employer] retractors. The linea alba was identified. The linea alba was incised with a 15 blade knife and each side was grasped with Coker clamps. The preperitoneal space was then probed with a hemostat until the peritoneum was opened and access was gained to the abdominal cavity. A 0 Vicryl pursestring stitch was placed in the fascia surrounding the opening. A Hassan cannula was then placed through the opening and anchored in place with the previously placed Vicryl purse string stitch. The abdomen was insufflated with carbon dioxide without difficulty. A laparoscope was inserted through the [facility] in the right upper quadrant was inspected. Next the epigastric region was  infiltrated with % Marcaine. A small incision was made with a 15 blade knife. A 5 mm port was placed bluntly through this incision into the abdominal cavity under direct vision. Next 2 sites were chosen laterally on the right side of the abdomen for placement of 5 mm ports. Each of these areas was infiltrated with quarter percent Marcaine. Small stab incisions were made with a 15 blade knife. 5 mm ports were then placed bluntly through these incisions into the abdominal cavity under direct vision without difficulty. A blunt grasper was placed through the lateralmost 5 mm port and used to grasp the dome of the gallbladder and elevated anteriorly and superiorly. Another blunt grasper was placed through the other 5 mm port and used to retract the body and neck of the gallbladder. A dissector was placed through the epigastric port and using the electrocautery the peritoneal reflection at the gallbladder neck was opened. Blunt dissection was then carried out in this area until the gallbladder neck-cystic duct junction was readily identified and a good window was created. A single clip was placed on the gallbladder neck. A small  ductotomy was made just below the clip with laparoscopic scissors. A 14-gauge Angiocath was then placed through the anterior abdominal wall under direct vision. A Reddick cholangiogram catheter was then placed through the Angiocath and flushed. The catheter was then placed in the cystic duct and anchored in place with a clip. A cholangiogram was obtained that showed no filling defects good emptying into the duodenum an adequate length on the cystic duct. The anchoring clip and catheters were then removed from the patient. 3 clips were placed proximally  on the cystic duct and the duct was divided between the 2 sets of clips. Posterior to this the cystic artery was identified and again dissected bluntly in a circumferential manner until a good window  was created. 2 clips were placed proximally  and one distally on the artery and the artery was divided between the 2 sets of clips. Next a laparoscopic hook cautery device was used to separate the gallbladder from the liver bed. Prior to completely detaching the gallbladder from the liver bed the liver bed was inspected and several small bleeding points were coagulated with the electrocautery until the area was completely hemostatic. The gallbladder was then detached the rest of it from the liver bed without difficulty. A laparoscopic bag was inserted through the hassan port. The laparoscope was moved to the epigastric port. The gallbladder was placed within the bag and the bag was sealed.  The bag with the gallbladder was then removed with the [facility] through the infraumbilical port without difficulty. The fascial defect was then closed with the previously placed Vicryl pursestring stitch as well as with another figure-of-eight 0 Vicryl stitch. The liver bed was inspected again and found to be hemostatic. The abdomen was irrigated with copious amounts of saline until the effluent was clear. The ports were then removed under direct vision without difficulty and were found to be hemostatic. The gas was allowed to escape. The skin incisions were all closed with interrupted 4-0 Monocryl subcuticular stitches. Dermabond dressings were applied. The patient tolerated the procedure well. At the end of the case all needle sponge and instrument counts were correct. The patient was then awakened and taken to recovery in stable condition  PLAN OF CARE: Discharge to home after PACU  PATIENT DISPOSITION:  PACU - hemodynamically stable.   Delay start of Pharmacological VTE agent (>24hrs) due to surgical blood loss or risk of bleeding: not applicable

## 2016-03-18 NOTE — Transfer of Care (Signed)
Immediate Anesthesia Transfer of Care Note  Patient: Dia CrawfordKimberly L Kitch  Procedure(s) Performed: Procedure(s): LAPAROSCOPIC CHOLECYSTECTOMY WITH INTRAOPERATIVE CHOLANGIOGRAM (N/A)  Patient Location: PACU  Anesthesia Type:General  Level of Consciousness: awake, alert  and oriented  Airway & Oxygen Therapy: Patient Spontanous Breathing and Patient connected to face mask oxygen  Post-op Assessment: Report given to RN, Post -op Vital signs reviewed and stable and Patient moving all extremities X 4  Post vital signs: Reviewed and stable  Last Vitals:  Vitals:   03/18/16 0849  BP: 111/71  Pulse: 75  Resp: 20  Temp: 36.7 C    Last Pain:  Vitals:   03/18/16 0849  TempSrc: Oral      Patients Stated Pain Goal: 3 (03/18/16 0933)  Complications: No apparent anesthesia complications

## 2016-03-18 NOTE — Anesthesia Postprocedure Evaluation (Signed)
Anesthesia Post Note  Patient: Natalie CrawfordKimberly L Kim  Procedure(s) Performed: Procedure(s) (LRB): LAPAROSCOPIC CHOLECYSTECTOMY WITH INTRAOPERATIVE CHOLANGIOGRAM (N/A)  Patient location during evaluation: PACU Anesthesia Type: General Level of consciousness: awake and alert Pain management: pain level controlled Vital Signs Assessment: post-procedure vital signs reviewed and stable Respiratory status: spontaneous breathing, nonlabored ventilation, respiratory function stable and patient connected to nasal cannula oxygen Cardiovascular status: blood pressure returned to baseline and stable Postop Assessment: no signs of nausea or vomiting Anesthetic complications: no    Last Vitals:  Vitals:   03/18/16 1350 03/18/16 1431  BP: 111/68 125/74  Pulse: 85 90  Resp: 16 16  Temp:      Last Pain:  Vitals:   03/18/16 1431  TempSrc:   PainSc: 2                  Ayaan Ringle,JAMES TERRILL

## 2016-03-18 NOTE — Interval H&P Note (Signed)
History and Physical Interval Note:  03/18/2016 9:54 AM  Natalie Kim  has presented today for surgery, with the diagnosis of gallstones  The various methods of treatment have been discussed with the patient and family. After consideration of risks, benefits and other options for treatment, the patient has consented to  Procedure(s): LAPAROSCOPIC CHOLECYSTECTOMY WITH INTRAOPERATIVE CHOLANGIOGRAM (N/A) as a surgical intervention .  The patient's history has been reviewed, patient examined, no change in status, stable for surgery.  I have reviewed the patient's chart and labs.  Questions were answered to the patient's satisfaction.     TOTH III,Dontell Mian S

## 2016-03-19 ENCOUNTER — Encounter (HOSPITAL_COMMUNITY): Payer: Self-pay | Admitting: General Surgery

## 2016-04-05 DIAGNOSIS — Z23 Encounter for immunization: Secondary | ICD-10-CM | POA: Diagnosis not present

## 2016-04-05 DIAGNOSIS — F411 Generalized anxiety disorder: Secondary | ICD-10-CM | POA: Diagnosis not present

## 2016-04-05 DIAGNOSIS — Z6841 Body Mass Index (BMI) 40.0 and over, adult: Secondary | ICD-10-CM | POA: Diagnosis not present

## 2016-04-05 DIAGNOSIS — E1121 Type 2 diabetes mellitus with diabetic nephropathy: Secondary | ICD-10-CM | POA: Diagnosis not present

## 2016-05-30 DIAGNOSIS — S93602A Unspecified sprain of left foot, initial encounter: Secondary | ICD-10-CM | POA: Diagnosis not present

## 2016-06-09 DIAGNOSIS — K219 Gastro-esophageal reflux disease without esophagitis: Secondary | ICD-10-CM | POA: Diagnosis not present

## 2016-06-09 DIAGNOSIS — S93402A Sprain of unspecified ligament of left ankle, initial encounter: Secondary | ICD-10-CM | POA: Diagnosis not present

## 2016-06-09 DIAGNOSIS — F411 Generalized anxiety disorder: Secondary | ICD-10-CM | POA: Diagnosis not present

## 2016-07-07 DIAGNOSIS — S93402A Sprain of unspecified ligament of left ankle, initial encounter: Secondary | ICD-10-CM | POA: Diagnosis not present

## 2016-07-07 DIAGNOSIS — F331 Major depressive disorder, recurrent, moderate: Secondary | ICD-10-CM | POA: Diagnosis not present

## 2016-07-07 DIAGNOSIS — E1121 Type 2 diabetes mellitus with diabetic nephropathy: Secondary | ICD-10-CM | POA: Diagnosis not present

## 2016-07-07 DIAGNOSIS — Z6841 Body Mass Index (BMI) 40.0 and over, adult: Secondary | ICD-10-CM | POA: Diagnosis not present

## 2016-07-07 DIAGNOSIS — R739 Hyperglycemia, unspecified: Secondary | ICD-10-CM | POA: Diagnosis not present

## 2016-07-14 ENCOUNTER — Other Ambulatory Visit: Payer: Self-pay | Admitting: Family Medicine

## 2016-07-14 ENCOUNTER — Ambulatory Visit
Admission: RE | Admit: 2016-07-14 | Discharge: 2016-07-14 | Disposition: A | Discharge status: Home / Self Care | Payer: BLUE CROSS/BLUE SHIELD | Source: Ambulatory Visit | Attending: Family Medicine | Admitting: Family Medicine

## 2016-07-14 DIAGNOSIS — M79672 Pain in left foot: Secondary | ICD-10-CM

## 2016-07-14 DIAGNOSIS — M7989 Other specified soft tissue disorders: Secondary | ICD-10-CM | POA: Diagnosis not present

## 2016-07-14 DIAGNOSIS — R609 Edema, unspecified: Secondary | ICD-10-CM

## 2016-07-15 ENCOUNTER — Ambulatory Visit
Admission: RE | Admit: 2016-07-15 | Discharge: 2016-07-15 | Disposition: A | Discharge status: Home / Self Care | Payer: BLUE CROSS/BLUE SHIELD | Source: Ambulatory Visit | Attending: Family Medicine | Admitting: Family Medicine

## 2016-07-15 ENCOUNTER — Other Ambulatory Visit: Payer: Self-pay | Admitting: Family Medicine

## 2016-07-15 DIAGNOSIS — S93322A Subluxation of tarsometatarsal joint of left foot, initial encounter: Secondary | ICD-10-CM | POA: Diagnosis not present

## 2016-07-15 DIAGNOSIS — M79672 Pain in left foot: Secondary | ICD-10-CM

## 2016-08-04 DIAGNOSIS — S93325A Dislocation of tarsometatarsal joint of left foot, initial encounter: Secondary | ICD-10-CM | POA: Diagnosis not present

## 2016-08-15 HISTORY — PX: FOOT SURGERY: SHX648

## 2016-08-17 DIAGNOSIS — G8918 Other acute postprocedural pain: Secondary | ICD-10-CM | POA: Diagnosis not present

## 2016-08-17 DIAGNOSIS — S93325D Dislocation of tarsometatarsal joint of left foot, subsequent encounter: Secondary | ICD-10-CM | POA: Diagnosis not present

## 2016-08-17 DIAGNOSIS — S93325A Dislocation of tarsometatarsal joint of left foot, initial encounter: Secondary | ICD-10-CM | POA: Diagnosis not present

## 2016-09-01 DIAGNOSIS — S93325D Dislocation of tarsometatarsal joint of left foot, subsequent encounter: Secondary | ICD-10-CM | POA: Diagnosis not present

## 2016-09-08 DIAGNOSIS — S93325D Dislocation of tarsometatarsal joint of left foot, subsequent encounter: Secondary | ICD-10-CM | POA: Diagnosis not present

## 2016-09-29 DIAGNOSIS — S93325D Dislocation of tarsometatarsal joint of left foot, subsequent encounter: Secondary | ICD-10-CM | POA: Diagnosis not present

## 2016-10-06 DIAGNOSIS — Z6841 Body Mass Index (BMI) 40.0 and over, adult: Secondary | ICD-10-CM | POA: Diagnosis not present

## 2016-10-06 DIAGNOSIS — I10 Essential (primary) hypertension: Secondary | ICD-10-CM | POA: Diagnosis not present

## 2016-10-06 DIAGNOSIS — E1121 Type 2 diabetes mellitus with diabetic nephropathy: Secondary | ICD-10-CM | POA: Diagnosis not present

## 2016-10-27 DIAGNOSIS — S93325D Dislocation of tarsometatarsal joint of left foot, subsequent encounter: Secondary | ICD-10-CM | POA: Diagnosis not present

## 2016-11-29 DIAGNOSIS — S93325D Dislocation of tarsometatarsal joint of left foot, subsequent encounter: Secondary | ICD-10-CM | POA: Diagnosis not present

## 2017-03-28 DIAGNOSIS — Z1329 Encounter for screening for other suspected endocrine disorder: Secondary | ICD-10-CM | POA: Diagnosis not present

## 2017-03-28 DIAGNOSIS — Z0000 Encounter for general adult medical examination without abnormal findings: Secondary | ICD-10-CM | POA: Diagnosis not present

## 2017-03-28 DIAGNOSIS — Z114 Encounter for screening for human immunodeficiency virus [HIV]: Secondary | ICD-10-CM | POA: Diagnosis not present

## 2017-03-30 ENCOUNTER — Other Ambulatory Visit: Payer: Self-pay | Admitting: Family Medicine

## 2017-03-30 DIAGNOSIS — J45901 Unspecified asthma with (acute) exacerbation: Secondary | ICD-10-CM | POA: Diagnosis not present

## 2017-03-30 DIAGNOSIS — G479 Sleep disorder, unspecified: Secondary | ICD-10-CM | POA: Diagnosis not present

## 2017-03-30 DIAGNOSIS — Z6841 Body Mass Index (BMI) 40.0 and over, adult: Secondary | ICD-10-CM | POA: Diagnosis not present

## 2017-03-30 DIAGNOSIS — Z1231 Encounter for screening mammogram for malignant neoplasm of breast: Secondary | ICD-10-CM

## 2017-03-30 DIAGNOSIS — Z131 Encounter for screening for diabetes mellitus: Secondary | ICD-10-CM | POA: Diagnosis not present

## 2017-03-30 DIAGNOSIS — E1121 Type 2 diabetes mellitus with diabetic nephropathy: Secondary | ICD-10-CM | POA: Diagnosis not present

## 2017-03-30 DIAGNOSIS — Z13220 Encounter for screening for lipoid disorders: Secondary | ICD-10-CM | POA: Diagnosis not present

## 2017-03-30 DIAGNOSIS — Z23 Encounter for immunization: Secondary | ICD-10-CM | POA: Diagnosis not present

## 2017-03-30 DIAGNOSIS — Z0000 Encounter for general adult medical examination without abnormal findings: Secondary | ICD-10-CM | POA: Diagnosis not present

## 2017-03-30 DIAGNOSIS — M62830 Muscle spasm of back: Secondary | ICD-10-CM | POA: Diagnosis not present

## 2017-04-07 ENCOUNTER — Other Ambulatory Visit: Payer: Self-pay

## 2017-04-07 ENCOUNTER — Emergency Department (HOSPITAL_COMMUNITY)
Admission: EM | Admit: 2017-04-07 | Discharge: 2017-04-07 | Disposition: A | Discharge status: Home / Self Care | Payer: BLUE CROSS/BLUE SHIELD | Attending: Emergency Medicine | Admitting: Emergency Medicine

## 2017-04-07 ENCOUNTER — Encounter (HOSPITAL_COMMUNITY): Payer: Self-pay

## 2017-04-07 DIAGNOSIS — R509 Fever, unspecified: Secondary | ICD-10-CM | POA: Diagnosis not present

## 2017-04-07 DIAGNOSIS — R0981 Nasal congestion: Secondary | ICD-10-CM | POA: Insufficient documentation

## 2017-04-07 DIAGNOSIS — R51 Headache: Secondary | ICD-10-CM | POA: Diagnosis not present

## 2017-04-07 DIAGNOSIS — Z7984 Long term (current) use of oral hypoglycemic drugs: Secondary | ICD-10-CM | POA: Diagnosis not present

## 2017-04-07 DIAGNOSIS — E119 Type 2 diabetes mellitus without complications: Secondary | ICD-10-CM | POA: Diagnosis not present

## 2017-04-07 DIAGNOSIS — J029 Acute pharyngitis, unspecified: Secondary | ICD-10-CM | POA: Diagnosis not present

## 2017-04-07 DIAGNOSIS — Z79899 Other long term (current) drug therapy: Secondary | ICD-10-CM | POA: Diagnosis not present

## 2017-04-07 DIAGNOSIS — J020 Streptococcal pharyngitis: Secondary | ICD-10-CM | POA: Insufficient documentation

## 2017-04-07 DIAGNOSIS — J45909 Unspecified asthma, uncomplicated: Secondary | ICD-10-CM | POA: Insufficient documentation

## 2017-04-07 DIAGNOSIS — I10 Essential (primary) hypertension: Secondary | ICD-10-CM | POA: Insufficient documentation

## 2017-04-07 MED ORDER — PENICILLIN G BENZATHINE 1200000 UNIT/2ML IM SUSP
1.2000 10*6.[IU] | Freq: Once | INTRAMUSCULAR | Status: AC
  Administered 2017-04-07: 1.2 10*6.[IU] via INTRAMUSCULAR
  Filled 2017-04-07: qty 2

## 2017-04-07 NOTE — Discharge Instructions (Signed)
You have been treated for strep throat with penicillin today. Alternate 600 mg of ibuprofen and [phone] mg of Tylenol every 3 hours as needed for pain and fever for the next 2-3 days. Do not exceed 4000 mg of Tylenol daily.  Drink plenty of fluids and get plenty of rest.  Use warm soups and teas, warm water salt gargles, and over-the-counter throat lozenges and sprays for your sore throat.  Use over-the-counter decongestant medications for your nasal congestion.  Follow-up with a primary care physician for reevaluation of your symptoms if they persist greater than 1 week.  Return to the ED immediately if any concerning signs or symptoms develop such as difficulty swallowing or breathing, drooling, fever greater than 102 F not controlled by ibuprofen or Tylenol, or severe one-sided pain and facial swelling.

## 2017-04-07 NOTE — ED Provider Notes (Signed)
[facility] EMERGENCY DEPARTMENT Provider Note   CSN: [id number] Arrival date & time: 04/07/17  4098     History   Chief Complaint No chief complaint on file.   HPI Natalie Kim is a 46 y.o. female who presents today with chief complaint acute onset, progressively worsening sore throat since yesterday.  She endorses some pain with swallowing, but is able to tolerate food and drink without difficulty.  She notes low-grade fever at home of 100.63F.  Pain radiates to the left ear and she has a mild frontal throbbing headache additionally.  Denies vision changes, nausea, vomiting, shortness of breath, chest pain.  Also endorses nasal congestion.  Her daughter was seen in the emergency department earlier today and found to have a positive rapid strep test and treated for strep pharyngitis.  The history is provided by the patient.    Past Medical History:  Diagnosis Date  . Arthritis    upper spine  . Asthma    no inhaler x 3 yrs  . Depression    D/C Prozac in December  . Diabetes mellitus without complication (HCC) 2014   gestational, Type 2  . Headache(784.0)   . Heartburn in pregnancy   . Hyperlipidemia   . Hypertension    Lisinopril (weaned off by December)  . Restless legs syndrome     Patient Active Problem List   Diagnosis Date Noted  . Cesarean delivery delivered 01/31/2013  . Abnormal maternal glucose tolerance, antepartum 11/15/2012  . Gestational diabetes 10/26/2012  . Elderly multigravida with antepartum condition or complication 07/25/2012    Past Surgical History:  Procedure Laterality Date  . CESAREAN SECTION N/A 01/31/2013   Procedure: PRIMARY CESAREAN SECTION;  Surgeon: Brock Bad, MD;  Location: [facility] ORS;  Service: Obstetrics;  Laterality: N/A;  . CHOLECYSTECTOMY N/A 03/18/2016   Procedure: LAPAROSCOPIC CHOLECYSTECTOMY WITH INTRAOPERATIVE CHOLANGIOGRAM;  Surgeon: Chevis Pretty III, MD;  Location: [facility] OR;  Service: General;   Laterality: N/A;  . MOUTH SURGERY      OB History    Gravida Para Term Preterm AB Living   2 2 2     2    SAB TAB Ectopic Multiple Live Births           2       Home Medications    Prior to Admission medications   Medication Sig Start Date End Date Taking? Authorizing Provider  acetaminophen (TYLENOL) 500 MG tablet Take 1,000 mg by mouth every 6 (six) hours as needed for moderate pain or headache.    [provider]  escitalopram (LEXAPRO) 20 MG tablet Take 20 mg by mouth daily. 02/02/16   [provider]  HYDROcodone-acetaminophen (NORCO/VICODIN) 5-325 MG tablet Take 1-2 tablets by mouth every 4 (four) hours as needed for moderate pain or severe pain. 03/18/16   Griselda Miner, MD  losartan (COZAAR) 50 MG tablet Take 50 mg by mouth every evening. 12/22/15   [provider]  metFORMIN (GLUCOPHAGE) 500 MG tablet Take 250 mg by mouth daily with supper. 02/09/16   [provider]    Family History Family History  Problem Relation Age of Onset  . Hypertension Mother   . Heart murmur Mother   . High Cholesterol Mother   . Prostate cancer Father   . Cancer Maternal Grandmother   . Cancer Maternal Grandfather     Social History Social History   Tobacco Use  . Smoking status: Never Smoker  . Smokeless tobacco: Never  Used  Substance Use Topics  . Alcohol use: No  . Drug use: No     Allergies   Latex and Other   Review of Systems Review of Systems  Constitutional: Positive for chills and fever.  HENT: Positive for sore throat. Negative for drooling and facial swelling.   Eyes: Negative for visual disturbance.  Respiratory: Negative for cough and shortness of breath.   Cardiovascular: Negative for chest pain.  Gastrointestinal: Negative for nausea and vomiting.  Neurological: Positive for headaches.     Physical Exam Updated Vital Signs BP (!) 149/102 (BP Location: Right Arm)   Pulse (!) 109   Temp 100 F (37.8 C) (Oral)   Resp  17   SpO2 98%   Physical Exam  Constitutional: She appears well-developed and well-nourished. No distress.  HENT:  Head: Normocephalic and atraumatic.  Right Ear: External ear normal.  Left Ear: External ear normal.  TMs normal bilaterally.  No frontal or maxillary sinus TTP.  Nasal septum is midline with pink mucosa and mild mucosal edema bilaterally.  Posterior oropharynx with erythema, tonsillar hypertrophy, and tonsillar exudates.  No uvular deviation, no trismus, or sublingual abnormalities.  Eyes: Conjunctivae are normal. Right eye exhibits no discharge. Left eye exhibits no discharge.  Neck: Normal range of motion. Neck supple. No JVD present. No tracheal deviation present.  Bilateral anterior cervical lymphadenopathy, no meningeal signs  Cardiovascular: Regular rhythm and normal heart sounds.  Slightly tachycardic  Pulmonary/Chest: Effort normal and breath sounds normal.  Abdominal: She exhibits no distension.  Musculoskeletal: Normal range of motion. She exhibits no edema.  Lymphadenopathy:    She has cervical adenopathy.  Neurological: She is alert.  Skin: Skin is warm and dry. No erythema.  Psychiatric: She has a normal mood and affect. Her behavior is normal.  Nursing note and vitals reviewed.    ED Treatments / Results  Labs (all labs ordered are listed, but only abnormal results are displayed) Labs Reviewed - No data to display  EKG  EKG Interpretation None       Radiology No results found.  Procedures Procedures (including critical care time)  Medications Ordered in ED Medications  penicillin g benzathine (BICILLIN LA) 1200000 UNIT/2ML injection 1.2 Million Units (not administered)     Initial Impression / Assessment and Plan / ED Course  I have reviewed the triage vital signs and the nursing notes.  Pertinent labs & imaging results that were available during my care of the patient were reviewed by me and considered in my medical decision making  (see chart for details).     Pt with low grade fever with tonsillar exudate, cervical lymphadenopathy, & dysphagia; diagnosis of strep.  Her daughter was seen earlier today and treated for strep throat.  Treated in the ED with PCN IM.  She has a history of diabetes mellitus so we will avoid use of steroids. Pt appears mildly dehydrated, discussed importance of water rehydration. Presentation non concerning for PTA or infxn spread to soft tissue. No trismus or uvula deviation. Specific return precautions discussed. Pt able to drink water in ED without difficulty with intact air way. Recommended PCP follow up if symptoms persist. Pt verbalized understanding of and agreement with plan and is safe for discharge home at this time.  She has no complaints prior to discharge.   Final Clinical Impressions(s) / ED Diagnoses   Final diagnoses:  Strep pharyngitis    ED Discharge Orders    None  Jeanie SewerFawze, Isauro Skelley A, PA-C 04/07/17 [id number]    Cathren LaineSteinl, Kevin, MD 04/07/17 [phone]

## 2017-04-07 NOTE — ED Triage Notes (Signed)
Patient complains of sore throat x 1 day and family member tested positive for strep. Complains of congestion as well.

## 2017-04-26 DIAGNOSIS — J45901 Unspecified asthma with (acute) exacerbation: Secondary | ICD-10-CM | POA: Diagnosis not present

## 2017-04-26 DIAGNOSIS — J069 Acute upper respiratory infection, unspecified: Secondary | ICD-10-CM | POA: Diagnosis not present

## 2017-04-26 DIAGNOSIS — J40 Bronchitis, not specified as acute or chronic: Secondary | ICD-10-CM | POA: Diagnosis not present

## 2017-04-27 ENCOUNTER — Ambulatory Visit: Payer: BLUE CROSS/BLUE SHIELD

## 2017-05-11 ENCOUNTER — Other Ambulatory Visit: Payer: Self-pay | Admitting: Family Medicine

## 2017-05-11 ENCOUNTER — Ambulatory Visit
Admission: RE | Admit: 2017-05-11 | Discharge: 2017-05-11 | Disposition: A | Discharge status: Home / Self Care | Payer: BLUE CROSS/BLUE SHIELD | Source: Ambulatory Visit | Attending: Family Medicine | Admitting: Family Medicine

## 2017-05-11 DIAGNOSIS — R05 Cough: Secondary | ICD-10-CM

## 2017-05-11 DIAGNOSIS — R059 Cough, unspecified: Secondary | ICD-10-CM

## 2017-05-11 DIAGNOSIS — Z6841 Body Mass Index (BMI) 40.0 and over, adult: Secondary | ICD-10-CM | POA: Diagnosis not present

## 2017-05-11 DIAGNOSIS — J069 Acute upper respiratory infection, unspecified: Secondary | ICD-10-CM | POA: Diagnosis not present

## 2017-05-11 DIAGNOSIS — Z7722 Contact with and (suspected) exposure to environmental tobacco smoke (acute) (chronic): Secondary | ICD-10-CM | POA: Diagnosis not present

## 2017-05-11 DIAGNOSIS — J45909 Unspecified asthma, uncomplicated: Secondary | ICD-10-CM | POA: Diagnosis not present

## 2017-05-13 DIAGNOSIS — J45901 Unspecified asthma with (acute) exacerbation: Secondary | ICD-10-CM | POA: Diagnosis not present

## 2017-05-13 DIAGNOSIS — Z6841 Body Mass Index (BMI) 40.0 and over, adult: Secondary | ICD-10-CM | POA: Diagnosis not present

## 2017-05-13 DIAGNOSIS — J069 Acute upper respiratory infection, unspecified: Secondary | ICD-10-CM | POA: Diagnosis not present

## 2017-06-20 DIAGNOSIS — Z6841 Body Mass Index (BMI) 40.0 and over, adult: Secondary | ICD-10-CM | POA: Diagnosis not present

## 2017-06-20 DIAGNOSIS — J069 Acute upper respiratory infection, unspecified: Secondary | ICD-10-CM | POA: Diagnosis not present

## 2017-07-05 ENCOUNTER — Other Ambulatory Visit: Payer: Self-pay | Admitting: Family Medicine

## 2017-07-05 ENCOUNTER — Ambulatory Visit
Admission: RE | Admit: 2017-07-05 | Discharge: 2017-07-05 | Disposition: A | Discharge status: Home / Self Care | Payer: BLUE CROSS/BLUE SHIELD | Source: Ambulatory Visit | Attending: Family Medicine | Admitting: Family Medicine

## 2017-07-05 DIAGNOSIS — J45901 Unspecified asthma with (acute) exacerbation: Secondary | ICD-10-CM | POA: Diagnosis not present

## 2017-07-05 DIAGNOSIS — R05 Cough: Secondary | ICD-10-CM | POA: Diagnosis not present

## 2017-07-05 DIAGNOSIS — J069 Acute upper respiratory infection, unspecified: Secondary | ICD-10-CM | POA: Diagnosis not present

## 2017-07-05 DIAGNOSIS — Z6841 Body Mass Index (BMI) 40.0 and over, adult: Secondary | ICD-10-CM | POA: Diagnosis not present

## 2017-07-05 DIAGNOSIS — I10 Essential (primary) hypertension: Secondary | ICD-10-CM | POA: Diagnosis not present

## 2017-07-05 DIAGNOSIS — R059 Cough, unspecified: Secondary | ICD-10-CM

## 2017-07-12 DIAGNOSIS — J329 Chronic sinusitis, unspecified: Secondary | ICD-10-CM | POA: Diagnosis not present

## 2017-07-12 DIAGNOSIS — K219 Gastro-esophageal reflux disease without esophagitis: Secondary | ICD-10-CM | POA: Diagnosis not present

## 2017-07-12 DIAGNOSIS — I10 Essential (primary) hypertension: Secondary | ICD-10-CM | POA: Diagnosis not present

## 2017-07-12 DIAGNOSIS — Z6841 Body Mass Index (BMI) 40.0 and over, adult: Secondary | ICD-10-CM | POA: Diagnosis not present

## 2017-07-27 DIAGNOSIS — J309 Allergic rhinitis, unspecified: Secondary | ICD-10-CM | POA: Diagnosis not present

## 2017-07-27 DIAGNOSIS — N159 Renal tubulo-interstitial disease, unspecified: Secondary | ICD-10-CM | POA: Diagnosis not present

## 2017-07-27 DIAGNOSIS — J40 Bronchitis, not specified as acute or chronic: Secondary | ICD-10-CM | POA: Diagnosis not present

## 2017-07-27 DIAGNOSIS — M549 Dorsalgia, unspecified: Secondary | ICD-10-CM | POA: Diagnosis not present

## 2017-07-27 DIAGNOSIS — Z6841 Body Mass Index (BMI) 40.0 and over, adult: Secondary | ICD-10-CM | POA: Diagnosis not present

## 2017-07-30 ENCOUNTER — Encounter (HOSPITAL_COMMUNITY): Payer: Self-pay | Admitting: Emergency Medicine

## 2017-07-30 ENCOUNTER — Other Ambulatory Visit: Payer: Self-pay

## 2017-07-30 ENCOUNTER — Emergency Department (HOSPITAL_COMMUNITY)
Admission: EM | Admit: 2017-07-30 | Discharge: 2017-07-31 | Disposition: A | Discharge status: Home / Self Care | Payer: BLUE CROSS/BLUE SHIELD | Attending: Emergency Medicine | Admitting: Emergency Medicine

## 2017-07-30 DIAGNOSIS — Z79899 Other long term (current) drug therapy: Secondary | ICD-10-CM | POA: Diagnosis not present

## 2017-07-30 DIAGNOSIS — R0981 Nasal congestion: Secondary | ICD-10-CM | POA: Diagnosis not present

## 2017-07-30 DIAGNOSIS — E119 Type 2 diabetes mellitus without complications: Secondary | ICD-10-CM | POA: Insufficient documentation

## 2017-07-30 DIAGNOSIS — R112 Nausea with vomiting, unspecified: Secondary | ICD-10-CM | POA: Diagnosis not present

## 2017-07-30 DIAGNOSIS — E785 Hyperlipidemia, unspecified: Secondary | ICD-10-CM | POA: Diagnosis not present

## 2017-07-30 DIAGNOSIS — R531 Weakness: Secondary | ICD-10-CM | POA: Diagnosis not present

## 2017-07-30 DIAGNOSIS — Z7984 Long term (current) use of oral hypoglycemic drugs: Secondary | ICD-10-CM | POA: Diagnosis not present

## 2017-07-30 DIAGNOSIS — Z91040 Latex allergy status: Secondary | ICD-10-CM | POA: Diagnosis not present

## 2017-07-30 DIAGNOSIS — I10 Essential (primary) hypertension: Secondary | ICD-10-CM | POA: Diagnosis not present

## 2017-07-30 DIAGNOSIS — R05 Cough: Secondary | ICD-10-CM | POA: Diagnosis not present

## 2017-07-30 DIAGNOSIS — J111 Influenza due to unidentified influenza virus with other respiratory manifestations: Secondary | ICD-10-CM | POA: Insufficient documentation

## 2017-07-30 DIAGNOSIS — J45909 Unspecified asthma, uncomplicated: Secondary | ICD-10-CM | POA: Diagnosis not present

## 2017-07-30 DIAGNOSIS — J101 Influenza due to other identified influenza virus with other respiratory manifestations: Secondary | ICD-10-CM | POA: Diagnosis not present

## 2017-07-30 LAB — COMPREHENSIVE METABOLIC PANEL
ALT: 23 U/L (ref 14–54)
AST: 38 U/L (ref 15–41)
Albumin: 4.2 g/dL (ref 3.5–5.0)
Alkaline Phosphatase: 55 U/L (ref 38–126)
Anion gap: 13 (ref 5–15)
BUN: 9 mg/dL (ref 6–20)
CO2: 19 mmol/L — ABNORMAL LOW (ref 22–32)
Calcium: 8.7 mg/dL — ABNORMAL LOW (ref 8.9–10.3)
Chloride: 99 mmol/L — ABNORMAL LOW (ref 101–111)
Creatinine, Ser: 0.84 mg/dL (ref 0.44–1.00)
GFR calc Af Amer: >60 mL/min (ref >60)
GFR calc non Af Amer: >60 mL/min (ref >60)
Glucose, Bld: 148 mg/dL — ABNORMAL HIGH (ref 65–99)
Potassium: 3.6 mmol/L (ref 3.5–5.1)
Sodium: 131 mmol/L — ABNORMAL LOW (ref 135–145)
Total Bilirubin: 1 mg/dL (ref 0.3–1.2)
Total Protein: 7.5 g/dL (ref 6.5–8.1)

## 2017-07-30 LAB — URINALYSIS, ROUTINE W REFLEX MICROSCOPIC
Bilirubin Urine: NEGATIVE
Glucose, UA: NEGATIVE mg/dL
Ketones, ur: 5 mg/dL — AB
Leukocytes, UA: NEGATIVE
Nitrite: NEGATIVE
Protein, ur: NEGATIVE mg/dL
Specific Gravity, Urine: 1.021 (ref 1.005–1.030)
pH: 6 (ref 5.0–8.0)

## 2017-07-30 LAB — LIPASE, BLOOD: Lipase: 28 U/L (ref 11–51)

## 2017-07-30 LAB — I-STAT BETA HCG BLOOD, ED (MC, WL, AP ONLY): I-stat hCG, quantitative: <5 m[IU]/mL (ref <5)

## 2017-07-30 LAB — CBC
HCT: 42.7 % (ref 36.0–46.0)
Hemoglobin: 14.2 g/dL (ref 12.0–15.0)
MCH: 29.8 pg (ref 26.0–34.0)
MCHC: 33.3 g/dL (ref 30.0–36.0)
MCV: 89.5 fL (ref 78.0–100.0)
Platelets: 188 10*3/uL (ref 150–400)
RBC: 4.77 MIL/uL (ref 3.87–5.11)
RDW: 13.6 % (ref 11.5–15.5)
WBC: 6.8 10*3/uL (ref 4.0–10.5)

## 2017-07-30 MED ORDER — ALBUTEROL SULFATE (2.5 MG/3ML) 0.083% IN NEBU
5.0000 mg | INHALATION_SOLUTION | Freq: Once | RESPIRATORY_TRACT | Status: AC
  Administered 2017-07-30: 5 mg via RESPIRATORY_TRACT
  Filled 2017-07-30: qty 6

## 2017-07-30 MED ORDER — ONDANSETRON 4 MG PO TBDP
4.0000 mg | ORAL_TABLET | Freq: Once | ORAL | Status: AC | PRN
  Administered 2017-07-30: 4 mg via ORAL
  Filled 2017-07-30: qty 1

## 2017-07-30 MED ORDER — SODIUM CHLORIDE 0.9 % IV BOLUS (SEPSIS)
1000.0000 mL | Freq: Once | INTRAVENOUS | Status: AC
  Administered 2017-07-30: 1000 mL via INTRAVENOUS

## 2017-07-30 MED ORDER — KETOROLAC TROMETHAMINE 30 MG/ML IJ SOLN
15.0000 mg | Freq: Once | INTRAMUSCULAR | Status: AC
  Administered 2017-07-30: 15 mg via INTRAVENOUS
  Filled 2017-07-30: qty 1

## 2017-07-30 NOTE — ED Triage Notes (Signed)
The patient went to urgent care today and was diagnosed with the flu.  The patient was given tamiflu and prednisone and sent home.  The patient says she cannot keep anything down and her fever has gone up.  She took her temperature 25 minutes ago and it was 102.7 so she took one tylenol.

## 2017-07-30 NOTE — ED Provider Notes (Signed)
[facility] EMERGENCY DEPARTMENT Provider Note   CSN: [id number] Arrival date & time: 07/30/17  1926     History   Chief Complaint Chief Complaint  Patient presents with  . Influenza  . Emesis    HPI Natalie Kim is a 47 y.o. female.  HPI Patient presents with concern of cough, congestion, weakness and fatigue.  Patient is here with her husband, who assists with the HPI. Patient has a history of reactive airway disease, and states that over the past 5 months she has had persistent generalized illness, with intermittent URI symptoms. However, over the past 2 days the patient has become progressively more weak, fatigued, nauseous, with several episodes of vomiting. Patient went to urgent care, had a positive influenza test, negative x-ray, and with concern for dehydration was sent here for evaluation. She denies confusion, disorientation, chest pain, focal abdominal pain, states that she feels poorly all over. Minimal relief in spite of initiating Tamiflu. No relief with OTC medication. Past Medical History:  Diagnosis Date  . Arthritis    upper spine  . Asthma    no inhaler x 3 yrs  . Depression    D/C Prozac in December  . Diabetes mellitus without complication (HCC) 2014   gestational, Type 2  . Headache(784.0)   . Heartburn in pregnancy   . Hyperlipidemia   . Hypertension    Lisinopril (weaned off by December)  . Restless legs syndrome     Patient Active Problem List   Diagnosis Date Noted  . Cesarean delivery delivered 01/31/2013  . Abnormal maternal glucose tolerance, antepartum 11/15/2012  . Gestational diabetes 10/26/2012  . Elderly multigravida with antepartum condition or complication 07/25/2012    Past Surgical History:  Procedure Laterality Date  . CESAREAN SECTION N/A 01/31/2013   Procedure: PRIMARY CESAREAN SECTION;  Surgeon: Brock Bad, MD;  Location: [facility];  Service: Obstetrics;  Laterality: N/A;  . CHOLECYSTECTOMY  N/A 03/18/2016   Procedure: LAPAROSCOPIC CHOLECYSTECTOMY WITH INTRAOPERATIVE CHOLANGIOGRAM;  Surgeon: Chevis Pretty III, MD;  Location: [facility] OR;  Service: General;  Laterality: N/A;  . MOUTH SURGERY      OB History    Gravida Para Term Preterm AB Living   2 2 2     2    SAB TAB Ectopic Multiple Live Births           2       Home Medications    Prior to Admission medications   Medication Sig Start Date End Date Taking? Authorizing Provider  acetaminophen (TYLENOL) 500 MG tablet Take 1,000 mg by mouth every 6 (six) hours as needed for moderate pain or headache.   Yes [provider]  cyclobenzaprine (FLEXERIL) 10 MG tablet Take 5-10 mg by mouth 3 (three) times daily as needed for muscle spasms.   Yes [provider]  escitalopram (LEXAPRO) 20 MG tablet Take 20 mg by mouth daily. 02/02/16  Yes [provider]  HYDROcodone-acetaminophen (NORCO/VICODIN) 5-325 MG tablet Take 1-2 tablets by mouth every 4 (four) hours as needed for moderate pain or severe pain. 03/18/16  Yes Chevis Pretty III, MD  metFORMIN (GLUCOPHAGE) 500 MG tablet Take 250 mg by mouth daily with supper. 02/09/16  Yes [provider]  oseltamivir (TAMIFLU) 75 MG capsule Take 75 mg by mouth 2 (two) times daily.   Yes [provider]  predniSONE (STERAPRED UNI-PAK 21 TAB) 10 MG (21) TBPK tablet Take 10 mg by mouth as directed.    Yes  [provider]    Family History Family History  Problem Relation Age of Onset  . Hypertension Mother   . Heart murmur Mother   . High Cholesterol Mother   . Prostate cancer Father   . Cancer Maternal Grandmother   . Cancer Maternal Grandfather     Social History Social History   Tobacco Use  . Smoking status: Never Smoker  . Smokeless tobacco: Never Used  Substance Use Topics  . Alcohol use: No  . Drug use: No     Allergies   Latex; Augmentin [amoxicillin-pot clavulanate]; and Other   Review of Systems Review of Systems    Constitutional:       Per HPI, otherwise negative  HENT:       Per HPI, otherwise negative  Respiratory:       Per HPI, otherwise negative  Cardiovascular:       Per HPI, otherwise negative  Gastrointestinal: Positive for nausea and vomiting.  Endocrine:       Negative aside from HPI  Genitourinary:       Neg aside from HPI   Musculoskeletal:       Per HPI, otherwise negative  Skin: Negative.   Neurological: Positive for weakness. Negative for syncope.     Physical Exam Updated Vital Signs BP (!) 133/99 (BP Location: Right Arm)   Pulse (!) 110   Temp 99.9 F (37.7 C) (Oral)   Resp 18   LMP 07/27/2017   SpO2 98%   Physical Exam  Constitutional: She is oriented to person, place, and time. She appears well-developed and well-nourished. No distress.  HENT:  Head: Normocephalic and atraumatic.  Eyes: Conjunctivae and EOM are normal.  Cardiovascular: Regular rhythm. Tachycardia present.  Pulmonary/Chest: Tachypnea noted. She has decreased breath sounds.  Abdominal: She exhibits no distension. There is no tenderness. There is no guarding.  Musculoskeletal: She exhibits no edema.  Neurological: She is alert and oriented to person, place, and time. No cranial nerve deficit.  Skin: Skin is warm and dry.  Psychiatric: She has a normal mood and affect.  Nursing note and vitals reviewed.    ED Treatments / Results  Labs (all labs ordered are listed, but only abnormal results are displayed) Labs Reviewed  COMPREHENSIVE METABOLIC PANEL - Abnormal; Notable for the following components:      Result Value   Sodium 131 (*)    Chloride 99 (*)    CO2 19 (*)    Glucose, Bld 148 (*)    Calcium 8.7 (*)    All other components within normal limits  URINALYSIS, ROUTINE W REFLEX MICROSCOPIC - Abnormal; Notable for the following components:   Hgb urine dipstick MODERATE (*)    Ketones, ur 5 (*)    Bacteria, UA RARE (*)    Squamous Epithelial / LPF 0-5 (*)    All other components  within normal limits  LIPASE, BLOOD  CBC  I-STAT BETA HCG BLOOD, ED ([facility], [facility], [facility] ONLY)    Radiology  X-ray from earlier today discussed with patient and her husband, negative for pneumonia  Procedures Procedures (including critical care time)  Medications Ordered in ED Medications  ondansetron (ZOFRAN-ODT) disintegrating tablet 4 mg (4 mg Oral Given 07/30/17 1948)  sodium chloride 0.9 % bolus 1,000 mL (1,000 mLs Intravenous New Bag/Given 07/30/17 2247)  albuterol (PROVENTIL) (2.5 MG/3ML) 0.083% nebulizer solution 5 mg (5 mg Nebulization Given 07/30/17 2250)  ketorolac (TORADOL) 30 MG/ML injection 15 mg (15 mg Intravenous Given 07/30/17 2249)  On monitor the patient has pulse oxygen saturation of 94% on room air, borderline Sinus tach, 110, abnormal   Initial Impression / Assessment and Plan / ED Course  I have reviewed the triage vital signs and the nursing notes.  Pertinent labs & imaging results that were available during my care of the patient were reviewed by me and considered in my medical decision making (see chart for details).     11:45 PM Patient has received fluids, Toradol, albuterol, states that she feels much better.  On heart rate has diminished, breathing has eased. We discussed all findings at length, and prior x-ray with no evidence for pneumonia, reassuring labs tonight purulent given his improvement here, no evidence for respiratory compromise, no evidence for bacteremia or sepsis, and with positive influenza test earlier today, the patient was discharged in stable condition with home care instructions, ongoing Tamiflu treatment.   Final Clinical Impressions(s) / ED Diagnoses  Influenza   Gerhard MunchLockwood, Dragon Thrush, MD 07/30/17 [phone]

## 2017-08-10 DIAGNOSIS — Z6841 Body Mass Index (BMI) 40.0 and over, adult: Secondary | ICD-10-CM | POA: Diagnosis not present

## 2017-08-10 DIAGNOSIS — I10 Essential (primary) hypertension: Secondary | ICD-10-CM | POA: Diagnosis not present

## 2017-08-10 DIAGNOSIS — J40 Bronchitis, not specified as acute or chronic: Secondary | ICD-10-CM | POA: Diagnosis not present

## 2017-08-10 DIAGNOSIS — J329 Chronic sinusitis, unspecified: Secondary | ICD-10-CM | POA: Diagnosis not present

## 2017-08-10 DIAGNOSIS — R739 Hyperglycemia, unspecified: Secondary | ICD-10-CM | POA: Diagnosis not present

## 2018-04-05 ENCOUNTER — Ambulatory Visit (HOSPITAL_COMMUNITY)
Admission: EM | Admit: 2018-04-05 | Discharge: 2018-04-05 | Disposition: A | Discharge status: Home / Self Care | Payer: Medicaid Other | Attending: Family Medicine | Admitting: Family Medicine

## 2018-04-05 ENCOUNTER — Encounter (HOSPITAL_COMMUNITY): Payer: Self-pay

## 2018-04-05 DIAGNOSIS — R21 Rash and other nonspecific skin eruption: Secondary | ICD-10-CM

## 2018-04-05 MED ORDER — CLOTRIMAZOLE-BETAMETHASONE 1-0.05 % EX CREA: TOPICAL_CREAM | CUTANEOUS | 0 refills | Status: DC

## 2018-04-05 NOTE — ED Triage Notes (Signed)
Pt presents with painful and itchy rash behind right knee.

## 2018-04-05 NOTE — Discharge Instructions (Addendum)
We will try a prescription strength cream to see if this helps.  You can take daily zyrtec or benadryl for itching.  Follow up as needed for continued or worsening symptoms

## 2018-04-05 NOTE — ED Provider Notes (Signed)
[facility]    CSN: [id number] Arrival date & time: 04/05/18  4098     History   Chief Complaint Chief Complaint  Patient presents with  . Rash    HPI Natalie Kim is a 47 y.o. female.   Pt is a 47 year old female that presents with rash to the right posterior knee. This has been constant and worsening since Saturday. She reports started after having some family pictures taken outside. She denies any specific contact with anything. She reports pain and itching at the site. She has been using cortisone cream without relief.  Denies any fever, joint pain. Denies any recent changes in lotions, detergents, foods or other possible irritants. No recent travel. Nobody else at home has the rash.No new foods or medications.   ROS per HPI        Past Medical History:  Diagnosis Date  . Arthritis    upper spine  . Asthma    no inhaler x 3 yrs  . Depression    D/C Prozac in December  . Diabetes mellitus without complication (HCC) 2014   gestational, Type 2  . Headache(784.0)   . Heartburn in pregnancy   . Hyperlipidemia   . Hypertension    Lisinopril (weaned off by December)  . Restless legs syndrome     Patient Active Problem List   Diagnosis Date Noted  . Cesarean delivery delivered 01/31/2013  . Abnormal maternal glucose tolerance, antepartum 11/15/2012  . Gestational diabetes 10/26/2012  . Elderly multigravida with antepartum condition or complication 07/25/2012    Past Surgical History:  Procedure Laterality Date  . CESAREAN SECTION N/A 01/31/2013   Procedure: PRIMARY CESAREAN SECTION;  Surgeon: Brock Bad, MD;  Location: [facility];  Service: Obstetrics;  Laterality: N/A;  . CHOLECYSTECTOMY N/A 03/18/2016   Procedure: LAPAROSCOPIC CHOLECYSTECTOMY WITH INTRAOPERATIVE CHOLANGIOGRAM;  Surgeon: Chevis Pretty III, MD;  Location: [facility];  Service: General;  Laterality: N/A;  . MOUTH SURGERY      OB History    Gravida  2   Para  2   Term  2   Preterm      AB      Living  2     SAB      TAB      Ectopic      Multiple      Live Births  2            Home Medications    Prior to Admission medications   Medication Sig Start Date End Date Taking? Authorizing Provider  acetaminophen (TYLENOL) 500 MG tablet Take 1,000 mg by mouth every 6 (six) hours as needed for moderate pain or headache.    [provider]  clotrimazole-betamethasone (LOTRISONE) cream Apply to affected area 2 times daily prn 04/05/18   Dahlia Byes A, NP  cyclobenzaprine (FLEXERIL) 10 MG tablet Take 5-10 mg by mouth 3 (three) times daily as needed for muscle spasms.    [provider]  escitalopram (LEXAPRO) 20 MG tablet Take 20 mg by mouth daily. 02/02/16   [provider]  HYDROcodone-acetaminophen (NORCO/VICODIN) 5-325 MG tablet Take 1-2 tablets by mouth every 4 (four) hours as needed for moderate pain or severe pain. 03/18/16   Chevis Pretty III, MD  metFORMIN (GLUCOPHAGE) 500 MG tablet Take 250 mg by mouth daily with supper. 02/09/16   [provider]  oseltamivir (TAMIFLU) 75 MG capsule Take 75 mg by mouth 2 (two) times daily.  [provider]  predniSONE (STERAPRED UNI-PAK 21 TAB) 10 MG (21) TBPK tablet Take 10 mg by mouth as directed.     [provider]    Family History Family History  Problem Relation Age of Onset  . Hypertension Mother   . Heart murmur Mother   . High Cholesterol Mother   . Prostate cancer Father   . Cancer Maternal Grandmother   . Cancer Maternal Grandfather     Social History Social History   Tobacco Use  . Smoking status: Never Smoker  . Smokeless tobacco: Never Used  Substance Use Topics  . Alcohol use: No  . Drug use: No     Allergies   Latex; Augmentin [amoxicillin-pot clavulanate]; and Other   Review of Systems Review of Systems   Physical Exam Triage Vital Signs ED Triage Vitals  Enc Vitals Group     BP 04/05/18 1006 134/84     Pulse  Rate 04/05/18 1006 80     Resp 04/05/18 1006 16     Temp 04/05/18 1006 98.2 F (36.8 C)     Temp Source 04/05/18 1006 Oral     SpO2 04/05/18 1006 99 %     Weight --      Height --      Head Circumference --      Peak Flow --      Pain Score 04/05/18 1009 8     Pain Loc --      Pain Edu? --      Excl. in GC? --    No data found.  Updated Vital Signs BP 134/84 (BP Location: Left Arm)   Pulse 80   Temp 98.2 F (36.8 C) (Oral)   Resp 16   SpO2 99%   Visual Acuity Right Eye Distance:   Left Eye Distance:   Bilateral Distance:    Right Eye Near:   Left Eye Near:    Bilateral Near:     Physical Exam  Constitutional: She appears well-developed and well-nourished.  HENT:  Head: Normocephalic and atraumatic.  Eyes: Conjunctivae are normal.  Neck: Normal range of motion.  Pulmonary/Chest: Effort normal.  Musculoskeletal: Normal range of motion.  Neurological: She is alert.  Skin: Skin is warm and dry. Rash noted.  See pic for setail.   Psychiatric: She has a normal mood and affect.  Nursing note and vitals reviewed.      UC Treatments / Results  Labs (all labs ordered are listed, but only abnormal results are displayed) Labs Reviewed - No data to display  EKG None  Radiology No results found.  Procedures Procedures (including critical care time)  Medications Ordered in UC Medications - No data to display  Initial Impression / Assessment and Plan / UC Course  I have reviewed the triage vital signs and the nursing notes.  Pertinent labs & imaging results that were available during my care of the patient were reviewed by me and considered in my medical decision making (see chart for details).     Rash appears to be contact dermatitis related similar to poison ivy rash. It is very symmetrical  and red, firey that makes me think yeast involvement. We will treat with Lotrisone cream to see if this helps. For continued or worsening symptoms told to return for  recheck.  Final Clinical Impressions(s) / UC Diagnoses   Final diagnoses:  Rash     Discharge Instructions     We will try a prescription strength cream to  see if this helps.  You can take daily zyrtec or benadryl for itching.  Follow up as needed for continued or worsening symptoms     ED Prescriptions    Medication Sig Dispense Auth. Provider   clotrimazole-betamethasone (LOTRISONE) cream Apply to affected area 2 times daily prn 15 g Dahlia Byes A, NP     Controlled Substance Prescriptions [place] Controlled Substance Registry consulted? Not Applicable   Janace Aris, NP 04/05/18 1046

## 2018-04-10 ENCOUNTER — Telehealth (HOSPITAL_COMMUNITY): Payer: Self-pay | Admitting: Family Medicine

## 2018-04-10 MED ORDER — PREDNISONE 10 MG (21) PO TBPK: ORAL_TABLET | ORAL | 0 refills | Status: DC

## 2018-04-10 NOTE — Telephone Encounter (Signed)
Patient is a 47 year old female that I saw approximately 5 days ago with rash to posterior knee.  Reports that the rash is somewhat improved but the localized erythema, increased warmth still remains.  She has been using the cream as prescribed. Patient denies any fever, chills, body aches.  She has mild tenderness localized to the area. Patient requesting further treatment and possible oral steroids to see if this may help. I believe this is appropriate Instructed that if her symptoms worsen or do not improve over the next 6 days during this treatment she will need to follow-up be seen in office.

## 2018-04-12 ENCOUNTER — Telehealth (HOSPITAL_COMMUNITY): Payer: Self-pay | Admitting: Emergency Medicine

## 2018-04-12 MED ORDER — PREDNISONE 10 MG (21) PO TBPK: ORAL_TABLET | ORAL | 0 refills | Status: DC

## 2018-04-12 NOTE — Telephone Encounter (Signed)
Pt called stating meds were sent to the wrong pharmacy. meds sent to preferred pharmacy. Pt had all questions answered.

## 2018-05-28 ENCOUNTER — Encounter (HOSPITAL_COMMUNITY): Payer: Self-pay | Admitting: Emergency Medicine

## 2018-05-28 ENCOUNTER — Inpatient Hospital Stay (HOSPITAL_COMMUNITY)
Admission: AD | Admit: 2018-05-28 | Discharge: 2018-06-01 | DRG: 885 | Disposition: A | Discharge status: Home / Self Care | Payer: Medicaid Other | Source: Intra-hospital | Attending: Psychiatry | Admitting: Psychiatry

## 2018-05-28 ENCOUNTER — Other Ambulatory Visit: Payer: Self-pay

## 2018-05-28 ENCOUNTER — Emergency Department (HOSPITAL_COMMUNITY)
Admission: EM | Admit: 2018-05-28 | Discharge: 2018-05-28 | Disposition: A | Discharge status: Other Acute Inpatient Hospital | Payer: Medicaid Other | Attending: Emergency Medicine | Admitting: Emergency Medicine

## 2018-05-28 ENCOUNTER — Encounter (HOSPITAL_COMMUNITY): Payer: Self-pay | Admitting: *Deleted

## 2018-05-28 DIAGNOSIS — Z91040 Latex allergy status: Secondary | ICD-10-CM | POA: Insufficient documentation

## 2018-05-28 DIAGNOSIS — R457 State of emotional shock and stress, unspecified: Secondary | ICD-10-CM | POA: Diagnosis not present

## 2018-05-28 DIAGNOSIS — E7800 Pure hypercholesterolemia, unspecified: Secondary | ICD-10-CM | POA: Diagnosis present

## 2018-05-28 DIAGNOSIS — E119 Type 2 diabetes mellitus without complications: Secondary | ICD-10-CM | POA: Diagnosis present

## 2018-05-28 DIAGNOSIS — Y9389 Activity, other specified: Secondary | ICD-10-CM | POA: Insufficient documentation

## 2018-05-28 DIAGNOSIS — J45909 Unspecified asthma, uncomplicated: Secondary | ICD-10-CM | POA: Insufficient documentation

## 2018-05-28 DIAGNOSIS — M1990 Unspecified osteoarthritis, unspecified site: Secondary | ICD-10-CM | POA: Diagnosis not present

## 2018-05-28 DIAGNOSIS — G2581 Restless legs syndrome: Secondary | ICD-10-CM | POA: Diagnosis not present

## 2018-05-28 DIAGNOSIS — Z046 Encounter for general psychiatric examination, requested by authority: Secondary | ICD-10-CM | POA: Insufficient documentation

## 2018-05-28 DIAGNOSIS — Z9049 Acquired absence of other specified parts of digestive tract: Secondary | ICD-10-CM | POA: Diagnosis not present

## 2018-05-28 DIAGNOSIS — F329 Major depressive disorder, single episode, unspecified: Secondary | ICD-10-CM | POA: Insufficient documentation

## 2018-05-28 DIAGNOSIS — Z79899 Other long term (current) drug therapy: Secondary | ICD-10-CM | POA: Insufficient documentation

## 2018-05-28 DIAGNOSIS — Z888 Allergy status to other drugs, medicaments and biological substances status: Secondary | ICD-10-CM

## 2018-05-28 DIAGNOSIS — Z8349 Family history of other endocrine, nutritional and metabolic diseases: Secondary | ICD-10-CM

## 2018-05-28 DIAGNOSIS — E785 Hyperlipidemia, unspecified: Secondary | ICD-10-CM | POA: Diagnosis not present

## 2018-05-28 DIAGNOSIS — R45851 Suicidal ideations: Secondary | ICD-10-CM

## 2018-05-28 DIAGNOSIS — Z8249 Family history of ischemic heart disease and other diseases of the circulatory system: Secondary | ICD-10-CM | POA: Diagnosis not present

## 2018-05-28 DIAGNOSIS — I10 Essential (primary) hypertension: Secondary | ICD-10-CM | POA: Diagnosis not present

## 2018-05-28 DIAGNOSIS — F4000 Agoraphobia, unspecified: Secondary | ICD-10-CM | POA: Diagnosis present

## 2018-05-28 DIAGNOSIS — X838XXA Intentional self-harm by other specified means, initial encounter: Secondary | ICD-10-CM | POA: Insufficient documentation

## 2018-05-28 DIAGNOSIS — Z8042 Family history of malignant neoplasm of prostate: Secondary | ICD-10-CM

## 2018-05-28 DIAGNOSIS — T1491XA Suicide attempt, initial encounter: Secondary | ICD-10-CM | POA: Insufficient documentation

## 2018-05-28 DIAGNOSIS — F419 Anxiety disorder, unspecified: Secondary | ICD-10-CM | POA: Diagnosis not present

## 2018-05-28 DIAGNOSIS — F32A Depression, unspecified: Secondary | ICD-10-CM

## 2018-05-28 DIAGNOSIS — F410 Panic disorder [episodic paroxysmal anxiety] without agoraphobia: Secondary | ICD-10-CM | POA: Diagnosis present

## 2018-05-28 DIAGNOSIS — F332 Major depressive disorder, recurrent severe without psychotic features: Principal | ICD-10-CM | POA: Diagnosis present

## 2018-05-28 DIAGNOSIS — Z7982 Long term (current) use of aspirin: Secondary | ICD-10-CM | POA: Diagnosis not present

## 2018-05-28 DIAGNOSIS — Z23 Encounter for immunization: Secondary | ICD-10-CM | POA: Diagnosis not present

## 2018-05-28 DIAGNOSIS — G4700 Insomnia, unspecified: Secondary | ICD-10-CM | POA: Diagnosis present

## 2018-05-28 DIAGNOSIS — Y999 Unspecified external cause status: Secondary | ICD-10-CM | POA: Insufficient documentation

## 2018-05-28 DIAGNOSIS — Z7984 Long term (current) use of oral hypoglycemic drugs: Secondary | ICD-10-CM | POA: Insufficient documentation

## 2018-05-28 DIAGNOSIS — Y929 Unspecified place or not applicable: Secondary | ICD-10-CM | POA: Insufficient documentation

## 2018-05-28 DIAGNOSIS — F22 Delusional disorders: Secondary | ICD-10-CM | POA: Insufficient documentation

## 2018-05-28 LAB — COMPREHENSIVE METABOLIC PANEL
ALT: 21 U/L (ref 0–44)
AST: 27 U/L (ref 15–41)
Albumin: 4.6 g/dL (ref 3.5–5.0)
Alkaline Phosphatase: 51 U/L (ref 38–126)
Anion gap: 8 (ref 5–15)
BUN: 15 mg/dL (ref 6–20)
CO2: 27 mmol/L (ref 22–32)
Calcium: 9.6 mg/dL (ref 8.9–10.3)
Chloride: 106 mmol/L (ref 98–111)
Creatinine, Ser: 0.77 mg/dL (ref 0.44–1.00)
GFR calc Af Amer: >60 mL/min (ref >60)
GFR calc non Af Amer: >60 mL/min (ref >60)
Glucose, Bld: 127 mg/dL — ABNORMAL HIGH (ref 70–99)
Potassium: 4 mmol/L (ref 3.5–5.1)
Sodium: 141 mmol/L (ref 135–145)
Total Bilirubin: 1.3 mg/dL — ABNORMAL HIGH (ref 0.3–1.2)
Total Protein: 7.6 g/dL (ref 6.5–8.1)

## 2018-05-28 LAB — SALICYLATE LEVEL: Salicylate Lvl: <7 mg/dL (ref 2.8–30.0)

## 2018-05-28 LAB — CBC
HCT: 47.4 % — ABNORMAL HIGH (ref 36.0–46.0)
Hemoglobin: 15.7 g/dL — ABNORMAL HIGH (ref 12.0–15.0)
MCH: 29.6 pg (ref 26.0–34.0)
MCHC: 33.1 g/dL (ref 30.0–36.0)
MCV: 89.4 fL (ref 80.0–100.0)
Platelets: 214 10*3/uL (ref 150–400)
RBC: 5.3 MIL/uL — ABNORMAL HIGH (ref 3.87–5.11)
RDW: 13.2 % (ref 11.5–15.5)
WBC: 7.7 10*3/uL (ref 4.0–10.5)
nRBC: 0 % (ref 0.0–0.2)

## 2018-05-28 LAB — I-STAT BETA HCG BLOOD, ED (MC, WL, AP ONLY): I-stat hCG, quantitative: <5 m[IU]/mL (ref <5)

## 2018-05-28 LAB — ACETAMINOPHEN LEVEL: Acetaminophen (Tylenol), Serum: <10 ug/mL — ABNORMAL LOW (ref 10–30)

## 2018-05-28 LAB — ETHANOL: Alcohol, Ethyl (B): <10 mg/dL (ref <10)

## 2018-05-28 MED ORDER — HYDROXYZINE HCL 25 MG PO TABS
25.0000 mg | ORAL_TABLET | Freq: Three times a day (TID) | ORAL | Status: DC | PRN
  Administered 2018-05-28 – 2018-05-31 (×4): 25 mg via ORAL
  Filled 2018-05-28: qty 1
  Filled 2018-05-28: qty 4

## 2018-05-28 MED ORDER — INFLUENZA VAC SPLIT QUAD 0.5 ML IM SUSY
0.5000 mL | PREFILLED_SYRINGE | INTRAMUSCULAR | Status: AC
  Administered 2018-05-29: 0.5 mL via INTRAMUSCULAR
  Filled 2018-05-28: qty 0.5

## 2018-05-28 MED ORDER — MAGNESIUM HYDROXIDE 400 MG/5ML PO SUSP: 30.0000 mL | Freq: Every day | ORAL | Status: DC | PRN

## 2018-05-28 MED ORDER — TRAZODONE HCL 50 MG PO TABS
50.0000 mg | ORAL_TABLET | Freq: Every evening | ORAL | Status: DC | PRN
  Administered 2018-05-28 – 2018-05-31 (×4): 50 mg via ORAL
  Filled 2018-05-28: qty 1

## 2018-05-28 MED ORDER — ACETAMINOPHEN 325 MG PO TABS
650.0000 mg | ORAL_TABLET | Freq: Four times a day (QID) | ORAL | Status: DC | PRN
  Administered 2018-05-28: 650 mg via ORAL
  Filled 2018-05-28: qty 2

## 2018-05-28 MED ORDER — ALUM & MAG HYDROXIDE-SIMETH 200-200-20 MG/5ML PO SUSP: 30.0000 mL | ORAL | Status: DC | PRN

## 2018-05-28 NOTE — ED Notes (Signed)
Report called to RN [place], [facility] rm [phone].  Pending [employer] transport.

## 2018-05-28 NOTE — ED Notes (Signed)
[employer] transport requested. 
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
## 2018-05-28 NOTE — ED Notes (Signed)
Pt A&O x 3, no distress noted, calm & cooperative, watching TV at present.  Monitoring for safety, Sitter at bedside,  Pending serving of IVC papers, report to [facility] and [employer] transport.

## 2018-05-28 NOTE — ED Triage Notes (Addendum)
Patient here from home with complaints of suicidal attempt today. Reports that her daughter told her that she wished she was dead. Patient tried to hang self, per [employer] patient was not able to get anything around neck, was able to get patient off ladder. Stated "I want to go be with my mom". Patient's mom died in 2022-09-15.

## 2018-05-28 NOTE — ED Notes (Signed)
tts into see 
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
## 2018-05-28 NOTE — ED Notes (Signed)
Patient resting quietly in bed, calm, cooperative.

## 2018-05-28 NOTE — BH Assessment (Signed)
Per [place], [facility], pt accepted to [facility] 405-2 to Dr. Jama Flavors after 9 pm. Notified EDP/staff. Pt will be transported by Patent examiner.
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
## 2018-05-29 DIAGNOSIS — R45851 Suicidal ideations: Secondary | ICD-10-CM

## 2018-05-29 DIAGNOSIS — F419 Anxiety disorder, unspecified: Secondary | ICD-10-CM | POA: Diagnosis not present

## 2018-05-29 DIAGNOSIS — F332 Major depressive disorder, recurrent severe without psychotic features: Secondary | ICD-10-CM | POA: Diagnosis not present

## 2018-05-29 DIAGNOSIS — G4700 Insomnia, unspecified: Secondary | ICD-10-CM

## 2018-05-29 LAB — LIPID PANEL
Cholesterol: 251 mg/dL — ABNORMAL HIGH (ref 0–200)
HDL: 69 mg/dL (ref >40)
LDL Cholesterol: 148 mg/dL — ABNORMAL HIGH (ref 0–99)
Total CHOL/HDL Ratio: 3.6 RATIO
Triglycerides: 172 mg/dL — ABNORMAL HIGH (ref <150)
VLDL: 34 mg/dL (ref 0–40)

## 2018-05-29 LAB — HEMOGLOBIN A1C
Hgb A1c MFr Bld: 6.5 % — ABNORMAL HIGH (ref 4.8–5.6)
Mean Plasma Glucose: 139.85 mg/dL

## 2018-05-29 LAB — TSH: TSH: 2.126 u[IU]/mL (ref 0.350–4.500)

## 2018-05-29 MED ORDER — ARIPIPRAZOLE 2 MG PO TABS
2.0000 mg | ORAL_TABLET | Freq: Every day | ORAL | Status: DC
  Administered 2018-05-29 – 2018-06-01 (×4): 2 mg via ORAL
  Filled 2018-05-29 (×7): qty 1

## 2018-05-29 MED ORDER — METFORMIN HCL 500 MG PO TABS
500.0000 mg | ORAL_TABLET | Freq: Every day | ORAL | Status: DC
  Administered 2018-05-30 – 2018-06-01 (×3): 500 mg via ORAL
  Filled 2018-05-29 (×5): qty 1

## 2018-05-29 MED ORDER — ESCITALOPRAM OXALATE 20 MG PO TABS
20.0000 mg | ORAL_TABLET | Freq: Every day | ORAL | Status: DC
  Administered 2018-05-29 – 2018-06-01 (×4): 20 mg via ORAL
  Filled 2018-05-29 (×5): qty 1
  Filled 2018-05-29: qty 2
  Filled 2018-05-29: qty 1
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
## 2018-05-29 NOTE — Progress Notes (Signed)
Recreation Therapy Notes  Date: 1.13.20 Time: 0930 Location: [address] Dayroom  Group Topic: Stress Management  Goal Area(s) Addresses:  Patient will identify stress management techniques. Patient will identify benefits of using stress management techniques post d/c.  Intervention: Stress Management  Activity :  Meditation.  LRT introduced the stress management technique of meditation.  LRT played a meditation on being resilient in the face of adversity.  Patients were to follow along as meditation played to engage in activity.  Education:  Stress Management, Discharge Planning.   Education Outcome: Acknowledges Education  Clinical Observations/Feedback: Pt did not attend group.    Caroll Rancher, LRT/CTRS         Lillia Abed, Alba Perillo A 05/29/2018 11:59 AM

## 2018-05-29 NOTE — Tx Team (Signed)
Initial Treatment Plan 05/29/2018 12:04 AM Natalie Kim XTA:[id number]    PATIENT STRESSORS: Financial difficulties Loss of mother   PATIENT STRENGTHS: Ability for insight Active sense of humor Average or above average intelligence Capable of independent living Communication skills General fund of knowledge Motivation for treatment/growth Physical Health Supportive family/friends   PATIENT IDENTIFIED PROBLEMS: "figure out how to deal with my mother's loss and with the anger that I feel"    SI  depression               DISCHARGE CRITERIA:  Improved stabilization in mood, thinking, and/or behavior Need for constant or close observation no longer present  PRELIMINARY DISCHARGE PLAN: Outpatient therapy  PATIENT/FAMILY INVOLVEMENT: This treatment plan has been presented to and reviewed with the patient, Natalie Kim.  The patient and family have been given the opportunity to ask questions and make suggestions.  Arrie Aran, [place] 05/29/2018, 12:04 AM
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
## 2018-05-29 NOTE — H&P (Addendum)
Psychiatric Admission Assessment Adult  Patient Identification: Natalie Kim MRN:  [id number] Date of Evaluation:  05/29/2018 Chief Complaint:  MDD Principal Diagnosis: Severe recurrent major depression without psychotic features ([place]) Diagnosis:  Principal Problem:   Severe recurrent major depression without psychotic features ([place]) Active Problems:   Suicidal ideations  History of Present Illness: Ms. Takemoto is a 48 year old married female with history of depression, asthma, migraines, and type 2 diabetes presenting for treatment after suicide attempt via hanging. Reports- "It was just a normal day, but I got into an argument with my daughter and I just snapped. I've been thinking a lot about my mom [who died 28-Aug-2017 the last couple weeks  and I wanted to be with her." Per IVC paperwork patient tried to hang herself twice. Patient reports going to her room during the argument with her daughter, stepping on a stepladder and carrying a long-sleeved shirt to tie. She states her husband followed her and stopped her while the daughter called EMS. Today she denies SI and reports "I don't think I would have hung myself. That was my way of saying I need help." She has long history of depression but denies history of suicidal thoughts prior to yesterday. She does report increased depression and anxiety since her mother's death. Depression became more severe over the holidays especially, with insomnia, anhedonia, and panic attacks. States she does have a support system but misses the type of support she had from her mother. Reports compliance with Lexapro 20 mg PO daily for years and feels this medication has been helpful. Denies ETOH, substance use. Denies HI, AVH.  Associated Signs/Symptoms: Depression Symptoms:  depressed mood, anhedonia, insomnia, suicidal thoughts with specific plan, anxiety, panic attacks, increased appetite, (Hypo) Manic Symptoms:  Impulsivity, Anxiety Symptoms:   Excessive Worry, Psychotic Symptoms:  denies PTSD Symptoms: NA Total Time spent with patient: 45 minutes  Past Psychiatric History: Anxiety and depression since her early 37s. Denies prior hospitalizations, suicide attempts, AVH, or mania.  Is the patient at risk to self? Yes.    Has the patient been a risk to self in the past 6 months? No.  Has the patient been a risk to self within the distant past? No.  Is the patient a risk to others? No.  Has the patient been a risk to others in the past 6 months? No.  Has the patient been a risk to others within the distant past? No.   Prior Inpatient Therapy:   Prior Outpatient Therapy:    Alcohol Screening: 1. How often do you have a drink containing alcohol?: Monthly or less 2. How many drinks containing alcohol do you have on a typical day when you are drinking?: 1 or 2 3. How often do you have six or more drinks on one occasion?: Never AUDIT-C Score: 1 9. Have you or someone else been injured as a result of your drinking?: No 10. Has a relative or friend or a doctor or another health worker been concerned about your drinking or suggested you cut down?: No Alcohol Use Disorder Identification Test Final Score (AUDIT): 1 Intervention/Follow-up: AUDIT Score <7 follow-up not indicated Substance Abuse History in the last 12 months:  No. Consequences of Substance Abuse: NA Previous Psychotropic Medications: Yes Currently on Lexapro 20 mg daily. Previous trial of Trintellix- discontinued due to "whole body itching" Psychological Evaluations: No  Past Medical History:  Past Medical History:  Diagnosis Date  . Arthritis    upper spine  . Asthma  no inhaler x 3 yrs  . Depression    D/C Prozac in December  . Diabetes mellitus without complication ([place]) 8502   gestational, Type 2  . Headache(784.0)   . Heartburn in pregnancy   . Hyperlipidemia   . Hypertension    Lisinopril (weaned off by December)  . Restless legs syndrome     Past  Surgical History:  Procedure Laterality Date  . CESAREAN SECTION N/A 01/31/2013   Procedure: PRIMARY CESAREAN SECTION;  Surgeon: Shelly Bombard, MD;  Location: [facility];  Service: Obstetrics;  Laterality: N/A;  . CHOLECYSTECTOMY N/A 03/18/2016   Procedure: LAPAROSCOPIC CHOLECYSTECTOMY WITH INTRAOPERATIVE CHOLANGIOGRAM;  Surgeon: Autumn Messing III, MD;  Location: [facility];  Service: General;  Laterality: N/A;  . MOUTH SURGERY     Family History:  Family History  Problem Relation Age of Onset  . Hypertension Mother   . Heart murmur Mother   . High Cholesterol Mother   . Prostate cancer Father   . Cancer Maternal Grandmother   . Cancer Maternal Grandfather    Family Psychiatric  History: Denies Tobacco Screening: Have you used any form of tobacco in the last 30 days? (Cigarettes, Smokeless Tobacco, Cigars, and/or Pipes): No Social History:  Social History   Substance and Sexual Activity  Alcohol Use No     Social History   Substance and Sexual Activity  Drug Use No    Additional Social History: Marital status: Long term relationship Number of Years Married: 1 Long term relationship, how long?: Patient reports she and her husband were in a relationship for 15 years before they married last year What types of issues is patient dealing with in the relationship?: Patient denies any current issues  Additional relationship information: No  Are you sexually active?: Yes What is your sexual orientation?: Heterosexual  Has your sexual activity been affected by drugs, alcohol, medication, or emotional stress?: No  Does patient have children?: Yes How many children?: 2 How is patient's relationship with their children?: Patient reports that she is close with her 46yo and 68yo daughters.     Pain Medications: denies Prescriptions: denies Over the Counter: denies History of alcohol / drug use?: No history of alcohol / drug abuse                    Allergies:   Allergies  Allergen  Reactions  . Latex Itching and Swelling    SWELLING REACTION UNSPECIFIED   . Augmentin [Amoxicillin-Pot Clavulanate]     Sick and nauseous  . Other Other (See Comments)    Pt reports allergy to an antibiotic in the penicillin family, unsure of actual medication and reaction.  It was when she was a child   Lab Results:  Results for orders placed or performed during the hospital encounter of 05/28/18 (from the past 48 hour(s))  Hemoglobin A1c     Status: Abnormal   Collection Time: 05/29/18  6:40 AM  Result Value Ref Range   Hgb A1c MFr Bld 6.5 (H) 4.8 - 5.6 %    Comment: (NOTE) Pre diabetes:          5.7%-6.4% Diabetes:              >6.4% Glycemic control for   <7.0% adults with diabetes    Mean Plasma Glucose 139.85 mg/dL    Comment: Performed at [facility], [place] [address], [place],  [address]  Lipid panel     Status: Abnormal  Collection Time: 05/29/18  6:40 AM  Result Value Ref Range   Cholesterol 251 (H) 0 - 200 mg/dL   Triglycerides 172 (H) <150 mg/dL   HDL 69 >40 mg/dL   Total CHOL/HDL Ratio 3.6 RATIO   VLDL 34 0 - 40 mg/dL   LDL Cholesterol 148 (H) 0 - 99 mg/dL    Comment:        Total Cholesterol/HDL:CHD Risk Coronary Heart Disease Risk Table                     Men   Women  1/2 Average Risk   3.4   3.3  Average Risk       5.0   4.4  2 X Average Risk   9.6   7.1  3 X Average Risk  23.4   11.0        Use the calculated Patient Ratio above and the CHD Risk Table to determine the patient's CHD Risk.        ATP III CLASSIFICATION (LDL):  <100     mg/dL   Optimal  100-129  mg/dL   Near or Above                    Optimal  130-159  mg/dL   Borderline  160-189  mg/dL   High  >190     mg/dL   Very High Performed at [facility] [address] [place], [place] [address]   TSH     Status: None   Collection Time: 05/29/18  6:40 AM  Result Value Ref Range   TSH 2.126 0.350 - 4.500 uIU/mL    Comment: Performed by a 3rd Generation  assay with a functional sensitivity of <=0.01 uIU/mL. Performed at [facility], [place] [address] [place], [place] [address]     Blood Alcohol level:  Lab Results  Component Value Date   ETH <10 25/42/7062    Metabolic Disorder Labs:  Lab Results  Component Value Date   HGBA1C 6.5 (H) 05/29/2018   MPG 139.85 05/29/2018   No results found for: PROLACTIN Lab Results  Component Value Date   CHOL 251 (H) 05/29/2018   TRIG 172 (H) 05/29/2018   HDL 69 05/29/2018   CHOLHDL 3.6 05/29/2018   VLDL 34 05/29/2018   LDLCALC 148 (H) 05/29/2018    Current Medications: Current Facility-Administered Medications  Medication Dose Route Frequency Provider Last Rate Last Dose  . acetaminophen (TYLENOL) tablet 650 mg  650 mg Oral Q6H PRN Lindon Romp A, NP   650 mg at 05/28/18 2325  . alum & mag hydroxide-simeth (MAALOX/MYLANTA) 200-200-20 MG/5ML suspension 30 mL  30 mL Oral Q4H PRN Lindon Romp A, NP      . ARIPiprazole (ABILIFY) tablet 2 mg  2 mg Oral Daily Amauris Debois, Myer Peer, MD   2 mg at 05/29/18 1310  . escitalopram (LEXAPRO) tablet 20 mg  20 mg Oral Daily Varian Innes A, MD   20 mg at 05/29/18 1310  . hydrOXYzine (ATARAX/VISTARIL) tablet 25 mg  25 mg Oral TID PRN Rozetta Nunnery, NP   25 mg at 05/28/18 2325  . magnesium hydroxide (MILK OF MAGNESIA) suspension 30 mL  30 mL Oral Daily PRN Rozetta Nunnery, NP      . Derrill Memo ON 05/30/2018] metFORMIN (GLUCOPHAGE) tablet 500 mg  500 mg Oral Q breakfast Elijahjames Fuelling, Myer Peer, MD      . traZODone (DESYREL) tablet 50 mg  50 mg Oral QHS PRN Lindon Romp A, NP   50 mg at 05/28/18 2325   PTA Medications: Medications Prior to Admission  Medication Sig Dispense Refill Last Dose  . aspirin-acetaminophen-caffeine (EXCEDRIN MIGRAINE) 250-250-65 MG tablet Take 2 tablets by mouth every 6 (six) hours as needed for headache or migraine.   Past Week at Unknown time  . clotrimazole-betamethasone (LOTRISONE) cream Apply to affected area 2 times daily  prn (Patient not taking: Reported on 05/28/2018) 15 g 0 Not Taking at Unknown time  . escitalopram (LEXAPRO) 20 MG tablet Take 20 mg by mouth daily.  3 05/27/2018 at Unknown time  . HYDROcodone-acetaminophen (NORCO/VICODIN) 5-325 MG tablet Take 1-2 tablets by mouth every 4 (four) hours as needed for moderate pain or severe pain. (Patient not taking: Reported on 05/28/2018) 30 tablet 0 Not Taking at Unknown time  . montelukast (SINGULAIR) 10 MG tablet Take 10 mg by mouth daily as needed (congestion).   Past Week at Unknown time  . predniSONE (STERAPRED UNI-PAK 21 TAB) 10 MG (21) TBPK tablet 6 tabs for 1 day, then 5 tabs for 1 das, then 4 tabs for 1 day, then 3 tabs for 1 day, 2 tabs for 1 day, then 1 tab for 1 day (Patient not taking: Reported on 05/28/2018) 21 tablet 0 Not Taking at Unknown time    Musculoskeletal: Strength & Muscle Tone: within normal limits Gait & Station: normal Patient leans: N/A  Psychiatric Specialty Exam: Physical Exam  Nursing note and vitals reviewed. Constitutional: She is oriented to person, place, and time. She appears well-developed and well-nourished.  Cardiovascular: Normal rate.  Respiratory: Effort normal.  Neurological: She is alert and oriented to person, place, and time.    Review of Systems  Respiratory: Negative for shortness of breath.   Cardiovascular: Negative for chest pain.  Gastrointestinal: Negative for nausea and vomiting.  Neurological: Positive for headaches (hx migraines).  Psychiatric/Behavioral: Positive for depression and suicidal ideas (recent attempt via hanging; denies SI currently). Negative for hallucinations, memory loss and substance abuse. The patient is nervous/anxious and has insomnia.     Blood pressure 108/84, pulse 92, temperature 98 F (36.7 C), temperature source Oral, resp. rate 16, height '5\' 2"'  (1.575 m), weight 103.4 kg, last menstrual period 05/02/2018.Body mass index is 41.7 kg/m.  See MD's admission SRA     Treatment Plan Summary: Daily contact with patient to assess and evaluate symptoms and progress in treatment and Medication management   Inpatient hospitalization.  See MD's admission SRA for medication management.  Patient will participate in the therapeutic group milieu.  Discharge disposition in progress.   Observation Level/Precautions:  15 minute checks  Laboratory:  Reviewed  Psychotherapy:  Group therapy  Medications:  See MAR  Consultations:  PRN  Discharge Concerns:  Safety and stabilization  Estimated LOS: 3-5 days  Other:     Physician Treatment Plan for Primary Diagnosis: Severe recurrent major depression without psychotic features ([place]) Long Term Goal(s): Improvement in symptoms so as ready for discharge3  Short Term Goals: Ability to identify changes in lifestyle to reduce recurrence of condition will improve, Ability to verbalize feelings will improve and Ability to disclose and discuss suicidal ideas  Physician Treatment Plan for Secondary Diagnosis: Principal Problem:   Severe recurrent major depression without psychotic features ([place]) Active Problems:   Suicidal ideations  Long Term Goal(s): Improvement in symptoms so as ready for discharge  Short Term Goals: Ability to demonstrate self-control will improve, Ability to identify and develop  effective coping behaviors will improve and Ability to identify triggers associated with substance abuse/mental health issues will improve  I certify that inpatient services furnished can reasonably be expected to improve the patient's condition.    Connye Burkitt, NP 1/13/20202:51 PM   I have discussed case with NP and have met with patient  Agree with NP note and assessment  51, married, has 2 children ( ages 89, 1- who is currently with the father). Unemployed. Lives with husband, children.  Presented to ED voluntarily, reports " it started as a normal day", but states she had an argument with her daughter and  felt " like I snapped". States she said something to the extent of " if you don't want me here I will just go see my mom" ( who died in 08/27/17) . She impulsively walked up a stepladder with a long cord on her hand, but husband walked in and prevented her from continuing . Patient says " I don't think I would have actually hurt myself , I was just very upset". She reports she has been depressed, particularly around the holidays, due to losing her mother, with whom she was very close, last year. Denies any recent suicidal ideations leading up to above.  She reports some neuro-vegetative symptoms- vague sense of anhedonia, poor sleep, decreased energy level. Medical History - DM , for which she was taking Metformin at 500 mgrs QDAY, although not recently.  ( HgbA1C 6.5) Psychiatric History- No history of past psychiatric admissions.Denies history of suicidal attempts, denies history of self injurious behaviors, denies history of psychosis, denies history of mania, denies history of PTSD. Reports history of depression, which started in her early 65s.  Reports occasional panic attacks and some agoraphobia. Has been on Lexapro for several years for depression. States she tried Trintellix but was not well tolerated . Denies alcohol or drug abuse . Denies history of psychiatric illness in family.  Dx- MDD, Suicidal Ideations  Plan- inpatient treatment.  Patient reports she was taking Metformin in the past, for several years, and states her HgbA1C was better controlled when she was on it. States she had been on 1/2 to 1 tablet per day. Start Metformin 500 mgrs QDAY . Will monitor CBG daily We discussed medication options. She states Lexapro has been well tolerated and has helped partially. States she does feel worse when she does not take it. As above, agrees to continue Lexapro, Agrees to Abilify augmentation. Start Abilify 2 mgrs QDAY. Side effects discussed .

## 2018-05-29 NOTE — Progress Notes (Signed)
Nursing Progress Note: 7p-7a D: Pt currently presents with a pleasant/appropriate affect and behavior. Pt states "I feel much better since being here." Interacting appropriately with the milieu. Pt reports good sleep during the previous night with current medication regimen. Pt did attend wrap-up group.  A: Pt provided with medications per providers orders. Pt's labs and vitals were monitored throughout the night. Pt supported emotionally and encouraged to express concerns and questions. Pt educated on medications.  R: Pt's safety ensured with 15 minute and environmental checks. Pt currently denies SI, HI, and AVH. Pt verbally contracts to seek staff if SI,HI, or AVH occurs and to consult with staff before acting on any harmful thoughts. Will continue to monitor.

## 2018-05-29 NOTE — [facility] Group Notes (Signed)
LCSW Group Therapy Note 05/29/2018 11:06 AM  Type of Therapy and Topic: Group Therapy: Overcoming Obstacles  Participation Level: Active  Description of Group:  In this group patients will be encouraged to explore what they see as obstacles to their own wellness and recovery. They will be guided to discuss their thoughts, feelings, and behaviors related to these obstacles. The group will process together ways to cope with barriers, with attention given to specific choices patients can make. Each patient will be challenged to identify changes they are motivated to make in order to overcome their obstacles. This group will be process-oriented, with patients participating in exploration of their own experiences as well as giving and receiving support and challenge from other group members.  Therapeutic Goals: 1. Patient will identify personal and current obstacles as they relate to admission. 2. Patient will identify barriers that currently interfere with their wellness or overcoming obstacles.  3. Patient will identify feelings, thought process and behaviors related to these barriers. 4. Patient will identify two changes they are willing to make to overcome these obstacles:   Summary of Patient Progress  Purpose was engaged and participated throughout the group session. Falisa reports that she has guilt due to her fear of forgetting her mother. Simranjit states that she often realizes that she doesn't think of her mother as often as she did and that she often feels guilty.     Therapeutic Modalities:  Cognitive Behavioral Therapy Solution Focused Therapy Motivational Interviewing Relapse Prevention Therapy   Alcario Drought Clinical Social Worker

## 2018-05-29 NOTE — Plan of Care (Signed)
Progress note  D: pt found in bed; compliant with medication administration. Pt has complaints of insomnia. Pt denies any physical pain or symptoms, rating her pain a 0/10. Pt denies any si/hi/ah/vh and verbally agrees to approach staff if these become apparent or before harming herself or others while at [facility]hh. Pt is bright and witnessed conversing with her peers in the dayroom.  A: pt provided support and encouragement. Pt given medication per protocol and standing orders. Q68m safety checks implemented and continued.  R: pt safe on the unit. Will continue to monitor.   Pt progressing in the following metrics  Problem: Education: Goal: Knowledge of [facility] Education information/materials will improve Outcome: Progressing Goal: Emotional status will improve Outcome: Progressing Goal: Mental status will improve Outcome: Progressing Goal: Verbalization of understanding the information provided will improve Outcome: Progressing

## 2018-05-30 DIAGNOSIS — F332 Major depressive disorder, recurrent severe without psychotic features: Secondary | ICD-10-CM | POA: Diagnosis not present

## 2018-05-30 DIAGNOSIS — G4700 Insomnia, unspecified: Secondary | ICD-10-CM | POA: Diagnosis not present

## 2018-05-30 DIAGNOSIS — R45851 Suicidal ideations: Secondary | ICD-10-CM | POA: Diagnosis not present

## 2018-05-30 DIAGNOSIS — F419 Anxiety disorder, unspecified: Secondary | ICD-10-CM | POA: Diagnosis not present

## 2018-05-30 LAB — GLUCOSE, CAPILLARY: Glucose-Capillary: 132 mg/dL — ABNORMAL HIGH (ref 70–99)

## 2018-05-30 NOTE — [facility] Group Notes (Signed)
Adult Psychoeducational Group Note  Date:  05/30/2018 Time:  9:46 PM  Group Topic/Focus:  Wrap-Up Group:   The focus of this group is to help patients review their daily goal of treatment and discuss progress on daily workbooks.  Participation Level:  Active  Participation Quality:  Appropriate and Attentive  Affect:  Appropriate  Cognitive:  Alert and Appropriate  Insight: Appropriate and Good  Engagement in Group:  Engaged  Modes of Intervention:  Discussion and Education  Additional Comments:  Pt attended and participated in wrap up group this evening. Pt rated their day a 10/10, due to them having an awesome day. Pt feels like they are back to being like themselves being that they have been receiving support from their hall mates. Pt has an ongoing goal to get over the anger and guilt that they are feeling.   Chrisandra Netters 05/30/2018, 9:46 PM

## 2018-05-30 NOTE — Plan of Care (Signed)
Nurse discussed anxiety, depression, coping skills with patient. 

## 2018-05-30 NOTE — [facility] Group Notes (Signed)
LCSW Group Therapy Note 05/30/2018 10:45 AM  Type of Therapy and Topic: Group Therapy: DBT House  Participation Level: Active   Description of Group:  In this group patients will be encouraged to explore  their values, behaviors they want to change, emotions they wish to increase, protective factors, supports, coping skills, and motivational factors. They will be guided to discuss their thoughts, feelings, and behaviors related to these obstacles. The group will be asked to individually process the activity and share their insights with the group. This group will be process-oriented, with patients participating in exploration of their own experiences as well as giving and receiving support and challenge from other group members.   Therapeutic Goals: 1. Patient will identify their values 2. Patient will identify behaviors they wish to modify  3. Patient will identify feelings and emotions they wish to increase 4. Patient will identify strengths, supports, protective factors 5. Patient will identify coping skills 6. Patient will identify goals and motivating factors for change   Summary of Patient Progress Patient participated in a deep breathing meditation and actively contributed to the discussion. Patient shared that she wants to change the way she communicates with others. Patient identifies her coping skills as cleaning and crying.  Therapeutic Modalities:  Dialectical Behavioral Therapy  Motivational Interviewing Relapse Prevention Therapy

## 2018-05-30 NOTE — Progress Notes (Signed)
Nursing Progress Note: 7p-7a D: Pt currently presents with a anxious/pleasant affect and behavior. Pt states "I had a really good day." Interacting appropriately with the milieu. Pt reports good sleep during the previous night with current medication regimen. Pt did attend wrap-up group.  A: Pt provided with medications per providers orders. Pt's labs and vitals were monitored throughout the night. Pt supported emotionally and encouraged to express concerns and questions. Pt educated on medications.  R: Pt's safety ensured with 15 minute and environmental checks. Pt currently denies SI, HI, and AVH. Pt verbally contracts to seek staff if SI,HI, or AVH occurs and to consult with staff before acting on any harmful thoughts. Will continue to monitor.

## 2018-05-30 NOTE — [facility] Group Notes (Signed)
[facility] Group Notes:  (Nursing/MHT/Case Management/Adjunct)  Date:  05/30/2018  Time:  3:15 pm  Type of Therapy:  Psychoeducational Skills  Participation Level:  Active  Participation Quality:  Appropriate  Affect:  Appropriate  Cognitive:  Appropriate  Insight:  Appropriate  Engagement in Group:  Engaged  Modes of Intervention:  Education  Summary of Progress/Problems: Patient was alert, oriented and participated appropriate in group.    Natalie Kim, Natalie Kim 05/30/2018, 4:44 PM

## 2018-05-30 NOTE — Progress Notes (Signed)
[facility] MD Progress Note  05/30/2018 12:07 PM Natalie Kim  MRN:  [id number] Subjective: Patient is seen and examined.  Patient is a 48 year old female with a past psychiatric history significant for depression and a past medical history significant for asthma, migraines and type 2 diabetes who presented for admission after a suicide attempt via hanging.  The patient admitted that she had gotten into an argument with her daughter and "just snapped".  She stated on admission that she was having trouble the last several weeks and a tried to hang herself twice prior to this admission.  Objective: Patient is seen and examined.  Patient's 48 year old female with the above-stated past psychiatric history who is seen in follow-up.  She stated she feels better today.  She stated she just snapped yesterday.  She denied any suicidal ideation today.  She stated she is talked to her daughter, and things are good.  She continues on Abilify, Lexapro, hydroxyzine.  Her blood sugar this morning is 132.  Review of her laboratories on admission showed elevated cholesterol, mildly elevated hemoglobin and hematocrit, normal liver function enzymes.  Her hemoglobin A1c was 6.5.  Blood alcohol was negative, no drug screen was noted.  Her vital signs this morning are stable.  She is afebrile.  She slept 6.75 hours last night.  Principal Problem: Severe recurrent major depression without psychotic features (HCC) Diagnosis: Principal Problem:   Severe recurrent major depression without psychotic features (HCC) Active Problems:   Suicidal ideations  Total Time spent with patient: 30 minutes  Past Psychiatric History: See admission H&P  Past Medical History:  Past Medical History:  Diagnosis Date  . Arthritis    upper spine  . Asthma    no inhaler x 3 yrs  . Depression    D/C Prozac in December  . Diabetes mellitus without complication (HCC) 2014   gestational, Type 2  . Headache(784.0)   . Heartburn in pregnancy    . Hyperlipidemia   . Hypertension    Lisinopril (weaned off by December)  . Restless legs syndrome     Past Surgical History:  Procedure Laterality Date  . CESAREAN SECTION N/A 01/31/2013   Procedure: PRIMARY CESAREAN SECTION;  Surgeon: Brock Bad, MD;  Location: [facility];  Service: Obstetrics;  Laterality: N/A;  . CHOLECYSTECTOMY N/A 03/18/2016   Procedure: LAPAROSCOPIC CHOLECYSTECTOMY WITH INTRAOPERATIVE CHOLANGIOGRAM;  Surgeon: Chevis Pretty III, MD;  Location: [facility];  Service: General;  Laterality: N/A;  . MOUTH SURGERY     Family History:  Family History  Problem Relation Age of Onset  . Hypertension Mother   . Heart murmur Mother   . High Cholesterol Mother   . Prostate cancer Father   . Cancer Maternal Grandmother   . Cancer Maternal Grandfather    Family Psychiatric  History: See admission H&P Social History:  Social History   Substance and Sexual Activity  Alcohol Use No     Social History   Substance and Sexual Activity  Drug Use No    Social History   Socioeconomic History  . Marital status: Married    Spouse name: Not on file  . Number of children: 1  . Years of education: Not on file  . Highest education level: Not on file  Occupational History  . Occupation: Print production planner  Social Needs  . Financial resource strain: Not on file  . Food insecurity:    Worry: Not on file    Inability: Not on file  . Transportation  needs:    Medical: Not on file    Non-medical: Not on file  Tobacco Use  . Smoking status: Never Smoker  . Smokeless tobacco: Never Used  Substance and Sexual Activity  . Alcohol use: No  . Drug use: No  . Sexual activity: Yes    Partners: Male    Birth control/protection: Other-see comments    Comment: reports H had vasectomy  Lifestyle  . Physical activity:    Days per week: Not on file    Minutes per session: Not on file  . Stress: Not on file  Relationships  . Social connections:    Talks on phone: Not on file    Gets  together: Not on file    Attends religious service: Not on file    Active member of club or organization: Not on file    Attends meetings of clubs or organizations: Not on file    Relationship status: Not on file  Other Topics Concern  . Not on file  Social History Narrative  . Not on file   Additional Social History:    Pain Medications: denies Prescriptions: denies Over the Counter: denies History of alcohol / drug use?: No history of alcohol / drug abuse                    Sleep: Good  Appetite:  Good  Current Medications: Current Facility-Administered Medications  Medication Dose Route Frequency Provider Last Rate Last Dose  . acetaminophen (TYLENOL) tablet 650 mg  650 mg Oral Q6H PRN Nira Conn A, NP   650 mg at 05/28/18 2325  . alum & mag hydroxide-simeth (MAALOX/MYLANTA) 200-200-20 MG/5ML suspension 30 mL  30 mL Oral Q4H PRN Nira Conn A, NP      . ARIPiprazole (ABILIFY) tablet 2 mg  2 mg Oral Daily Cobos, Rockey Situ, MD   2 mg at 05/30/18 0805  . escitalopram (LEXAPRO) tablet 20 mg  20 mg Oral Daily Cobos, Rockey Situ, MD   20 mg at 05/30/18 0805  . hydrOXYzine (ATARAX/VISTARIL) tablet 25 mg  25 mg Oral TID PRN Nira Conn A, NP   25 mg at 05/29/18 2150  . magnesium hydroxide (MILK OF MAGNESIA) suspension 30 mL  30 mL Oral Daily PRN Nira Conn A, NP      . metFORMIN (GLUCOPHAGE) tablet 500 mg  500 mg Oral Q breakfast Cobos, Rockey Situ, MD   500 mg at 05/30/18 0805  . traZODone (DESYREL) tablet 50 mg  50 mg Oral QHS PRN Jackelyn Poling, NP   50 mg at 05/29/18 2150    Lab Results:  Results for orders placed or performed during the hospital encounter of 05/28/18 (from the past 48 hour(s))  Hemoglobin A1c     Status: Abnormal   Collection Time: 05/29/18  6:40 AM  Result Value Ref Range   Hgb A1c MFr Bld 6.5 (H) 4.8 - 5.6 %    Comment: (NOTE) Pre diabetes:          5.7%-6.4% Diabetes:              >6.4% Glycemic control for   <7.0% adults with diabetes     Mean Plasma Glucose 139.85 mg/dL    Comment: Performed at [facility], [address], [place], [place] [address]  Lipid panel     Status: Abnormal   Collection Time: 05/29/18  6:40 AM  Result Value Ref Range   Cholesterol 251 (H) 0 - 200  mg/dL   Triglycerides 161172 (H) <150 mg/dL   HDL 69 >09>40 mg/dL   Total CHOL/HDL Ratio 3.6 RATIO   VLDL 34 0 - 40 mg/dL   LDL Cholesterol 604148 (H) 0 - 99 mg/dL    Comment:        Total Cholesterol/HDL:CHD Risk Coronary Heart Disease Risk Table                     Men   Women  1/2 Average Risk   3.4   3.3  Average Risk       5.0   4.4  2 X Average Risk   9.6   7.1  3 X Average Risk  23.4   11.0        Use the calculated Patient Ratio above and the CHD Risk Table to determine the patient's CHD Risk.        ATP III CLASSIFICATION (LDL):  <100     mg/dL   Optimal  540-981100-129  mg/dL   Near or Above                    Optimal  130-159  mg/dL   Borderline  191-478160-189  mg/dL   High  >295>190     mg/dL   Very High Performed at [facility], [address] [place], [place] [address]   TSH     Status: None   Collection Time: 05/29/18  6:40 AM  Result Value Ref Range   TSH 2.126 0.350 - 4.500 uIU/mL    Comment: Performed by a 3rd Generation assay with a functional sensitivity of <=0.01 uIU/mL. Performed at [facility], [address] [place], [place] [address]   Glucose, capillary     Status: Abnormal   Collection Time: 05/30/18  6:07 AM  Result Value Ref Range   Glucose-Capillary 132 (H) 70 - 99 mg/dL    Blood Alcohol level:  Lab Results  Component Value Date   ETH <10 05/28/2018    Metabolic Disorder Labs: Lab Results  Component Value Date   HGBA1C 6.5 (H) 05/29/2018   MPG 139.85 05/29/2018   No results found for: PROLACTIN Lab Results  Component Value Date   CHOL 251 (H) 05/29/2018   TRIG 172 (H) 05/29/2018   HDL 69 05/29/2018   CHOLHDL 3.6 05/29/2018   VLDL 34 05/29/2018   LDLCALC 148 (H)  05/29/2018    Physical Findings: AIMS: Facial and Oral Movements Muscles of Facial Expression: None, normal Lips and Perioral Area: None, normal Jaw: None, normal Tongue: None, normal,Extremity Movements Upper (arms, wrists, hands, fingers): None, normal Lower (legs, knees, ankles, toes): None, normal, Trunk Movements Neck, shoulders, hips: None, normal, Overall Severity Severity of abnormal movements (highest score from questions above): None, normal Incapacitation due to abnormal movements: None, normal Patient's awareness of abnormal movements (rate only patient's report): No Awareness, Dental Status Current problems with teeth and/or dentures?: No Does patient usually wear dentures?: No  CIWA:  CIWA-Ar Total: 1 COWS:  COWS Total Score: 2  Musculoskeletal: Strength & Muscle Tone: within normal limits Gait & Station: normal Patient leans: N/A  Psychiatric Specialty Exam: Physical Exam  Nursing note and vitals reviewed. Constitutional: She is oriented to person, place, and time. She appears well-developed and well-nourished.  HENT:  Head: Normocephalic and atraumatic.  Respiratory: Effort normal.  Neurological: She is alert and oriented to person, place, and time.    ROS  Blood pressure (!) 123/94,  pulse 88, temperature 98.9 F (37.2 C), temperature source Oral, resp. rate 16, height 5\' 2"  (1.575 m), weight 103.4 kg, last menstrual period 05/02/2018.Body mass index is 41.7 kg/m.  General Appearance: Casual  Eye Contact:  Good  Speech:  Normal Rate  Volume:  Normal  Mood:  Anxious  Affect:  Congruent  Thought Process:  Coherent and Descriptions of Associations: Intact  Orientation:  Full (Time, Place, and Person)  Thought Content:  Logical  Suicidal Thoughts:  No  Homicidal Thoughts:  No  Memory:  Immediate;   Fair Recent;   Fair Remote;   Fair  Judgement:  Intact  Insight:  Lacking  Psychomotor Activity:  Normal  Concentration:  Concentration: Fair and  Attention Span: Fair  Recall:  [employer]  Fund of Knowledge:  Fair  Language:  Fair  Akathisia:  Negative  Handed:  Right  AIMS (if indicated):     Assets:  Communication Skills Desire for Improvement Financial Resources/Insurance Housing Physical Health Resilience  ADL's:  Intact  Cognition:  WNL  Sleep:  Number of Hours: 6.75     Treatment Plan Summary: Daily contact with patient to assess and evaluate symptoms and progress in treatment, Medication management and Plan Patient is seen and examined.Natalie Kim is a 48 year old married female with history of depression, asthma, migraines, and type 2 diabetes presenting for treatment after suicide attempt via hanging. Reports- "It was just a normal day, but I got into an argument with my daughter and I just snapped. I've been thinking a lot about my mom [who died April 2019] the last couple weeks  and I wanted to be with her." Per IVC paperwork patient tried to hang herself twice. Patient stated she is doing better today.  She states she is not having any suicidal ideation.  He denied any side effects to her current medications.  We need to get collateral information from her family.  This seems like a dramatic improvement given that she had tried to hang herself several times over the last several weeks.  I am not going to change any of her medications today.  We will attempt to get collateral information to get a better idea of what is going on. 1.  Continue Abilify 2 mg p.o. daily.  This is for mood stability and depression. 2.  Continue Lexapro 20 mg p.o. daily for mood and anxiety. 3.  Continue hydroxyzine 25 mg p.o. 3 times daily as needed anxiety. 4.  Continue Glucophage 500 mg p.o. daily for diabetes mellitus 5.  Continue trazodone 50 mg p.o. nightly as needed insomnia. 6.  Discharge planning-in progress. Antonieta PertGreg Lawson Alleyah Twombly, MD 05/30/2018, 12:07 PM

## 2018-05-30 NOTE — Progress Notes (Signed)
Recreation Therapy Notes  Animal-Assisted Activity (AAA) Program Checklist/Progress Notes Patient Eligibility Criteria Checklist & Daily Group note for Rec Tx Intervention  Date: 1.14.20 Time: 1430 Location: 400 [address] Dayroom   AAA/T Program Assumption of Risk Form signed by Patient/ or Parent Legal Guardian  YES   Patient is free of allergies or sever asthma  YES   Patient reports no fear of animals  YES   Patient reports no history of cruelty to animals  YES   Patient understands his/her participation is voluntary  YES   Patient washes hands before animal contact  YES   Patient washes hands after animal contact  YES         Behavioral Response: Engaged  Education: Hand Washing, Appropriate Animal Interaction   Education Outcome: Acknowledges understanding/In group clarification offered/Needs additional education.   Clinical Observations/Feedback: Pt attended group session.    Daishaun Ayre, LRT/CTRS         Dublin Grayer A 05/30/2018 3:36 PM 

## 2018-05-30 NOTE — Progress Notes (Signed)
D:  Patient's self inventory sheet, patient has poor sleep, sleep medication not helpful.  Good appetite, normal energy level, good concentration.  Rated depression 3, hopeless 2, anxiety 6.  Denied withdrawals.  Checked restless legs.  Denied  SI.  Physical problems, headaches.  No physical pain.  No pain medicine.  Goal is figure out how to cope with stress.  Plans to talk about discharge.  No discharge plans. A:  Medications administered per MD orders.  Emotional support and encouragement given patient. R:  Denied SI and HI, contracts for safety.  Denied A/V hallucinations.  Safety maintained with 15 minute checks.

## 2018-05-31 DIAGNOSIS — G4700 Insomnia, unspecified: Secondary | ICD-10-CM | POA: Diagnosis not present

## 2018-05-31 DIAGNOSIS — F332 Major depressive disorder, recurrent severe without psychotic features: Secondary | ICD-10-CM | POA: Diagnosis not present

## 2018-05-31 DIAGNOSIS — R45851 Suicidal ideations: Secondary | ICD-10-CM | POA: Diagnosis not present

## 2018-05-31 DIAGNOSIS — F419 Anxiety disorder, unspecified: Secondary | ICD-10-CM | POA: Diagnosis not present

## 2018-05-31 LAB — GLUCOSE, CAPILLARY: Glucose-Capillary: 137 mg/dL — ABNORMAL HIGH (ref 70–99)

## 2018-05-31 NOTE — Therapy (Signed)
Occupational Therapy Group Note  Date:  05/31/2018 Time:  11:28 AM  Group Topic/Focus:  Self Esteem Action Plan:   The focus of this group is to help patients create a plan to continue to build self-esteem after discharge.  Participation Level:  Active  Participation Quality:  Appropriate  Affect:  Blunted  Cognitive:  Appropriate  Insight: Improving  Engagement in Group:  Engaged  Modes of Intervention:  Activity, Discussion, Education and Socialization  Additional Comments:    S: "Recieivng love helps increase my self esteem"  O: OT tx with focus on self esteem building this date. Education given on definition of self esteem, with both causes of low and high self esteem identified. Activity given for pt to identify a positive/aspiring trait for each letter of the alphabet. Pt to work with peers to help complete activity and build positive thinking.   A: Pt presents to group with blunted affect, engaged and participatory throughout session. Pt helped add to self esteem education by brainstorming with other peers. Pt did appear to be very friendly with two peers, and less with others. Pt pulled by provider before completing A-Z activity, came back at end of group to complete activity on own time.  P: Education given on self esteem and how to improve this date. Handouts and activities given to help facilitate skills when reintegrating into community.   Dalphine Handing, MSOT, OTR/L Behavioral Health OT/ Acute Relief OT PHP Office: [phone]  Dalphine Handing 05/31/2018, 11:28 AM

## 2018-05-31 NOTE — Progress Notes (Signed)
[facility]  05/31/2018 11:44 AM Natalie Kim  MRN:  [id number] Subjective:  "I feel back to myself."  Ms. Natalie HaltMorgan observed active in group therapy on the unit. She reports improved mood and states this is the best she has felt since her mother passed in April 2019. States she has realized "I don't have to be strong all the time. I can have emotions." Reports that she had let emotions build up too long with her family which led to suicide attempt. States intent to follow up with outpatient grief counseling and take time away for herself if she notices depression returning. Reports good visit with her husband and daughter last night. Denies SI. Denies medication side effects. Slept 6.75 hours last night- states trazodone is helping with sleep which improves her mood as well. She is agreeable to signing ROI for collateral information from husband.  Principal Problem: Severe recurrent major depression without psychotic features (HCC) Diagnosis: Principal Problem:   Severe recurrent major depression without psychotic features (HCC) Active Problems:   Suicidal ideations  Total Time spent with patient: 15 minutes  Past Psychiatric History: See admission H&P  Past Medical History:  Past Medical History:  Diagnosis Date  . Arthritis    upper spine  . Asthma    no inhaler x 3 yrs  . Depression    D/C Prozac in December  . Diabetes mellitus without complication (HCC) 2014   gestational, Type 2  . Headache(784.0)   . Heartburn in pregnancy   . Hyperlipidemia   . Hypertension    Lisinopril (weaned off by December)  . Restless legs syndrome     Past Surgical History:  Procedure Laterality Date  . CESAREAN SECTION N/A 01/31/2013   Procedure: PRIMARY CESAREAN SECTION;  Surgeon: Brock Badharles A Harper, MD;  Location: [facility];  Service: Obstetrics;  Laterality: N/A;  . CHOLECYSTECTOMY N/A 03/18/2016   Procedure: LAPAROSCOPIC CHOLECYSTECTOMY WITH INTRAOPERATIVE CHOLANGIOGRAM;  Surgeon: Chevis PrettyPaul  Toth III, MD;  Location: [facility] OR;  Service: General;  Laterality: N/A;  . MOUTH SURGERY     Family History:  Family History  Problem Relation Age of Onset  . Hypertension Mother   . Heart murmur Mother   . High Cholesterol Mother   . Prostate cancer Father   . Cancer Maternal Grandmother   . Cancer Maternal Grandfather    Family Psychiatric  History: See admission H&P Social History:  Social History   Substance and Sexual Activity  Alcohol Use No     Social History   Substance and Sexual Activity  Drug Use No    Social History   Socioeconomic History  . Marital status: Married    Spouse name: Not on file  . Number of children: 1  . Years of education: Not on file  . Highest education level: Not on file  Occupational History  . Occupation: Print production plannerffice Manager  Social Needs  . Financial resource strain: Not on file  . Food insecurity:    Worry: Not on file    Inability: Not on file  . Transportation needs:    Medical: Not on file    Non-medical: Not on file  Tobacco Use  . Smoking status: Never Smoker  . Smokeless tobacco: Never Used  Substance and Sexual Activity  . Alcohol use: No  . Drug use: No  . Sexual activity: Yes    Partners: Male    Birth control/protection: Other-see comments    Comment: reports H had vasectomy  Lifestyle  .  Physical activity:    Days per week: Not on file    Minutes per session: Not on file  . Stress: Not on file  Relationships  . Social connections:    Talks on phone: Not on file    Gets together: Not on file    Attends religious service: Not on file    Active member of club or organization: Not on file    Attends meetings of clubs or organizations: Not on file    Relationship status: Not on file  Other Topics Concern  . Not on file  Social History Narrative  . Not on file   Additional Social History:    Pain Medications: denies Prescriptions: denies Over the Counter: denies History of alcohol / drug use?: No history of  alcohol / drug abuse                    Sleep: Good  Appetite:  Good  Current Medications: Current Facility-Administered Medications  Medication Dose Route Frequency Provider Last Rate Last Dose  . acetaminophen (TYLENOL) tablet 650 mg  650 mg Oral Q6H PRN Nira Conn A, NP   650 mg at 05/28/18 2325  . alum & mag hydroxide-simeth (MAALOX/MYLANTA) 200-200-20 MG/5ML suspension 30 mL  30 mL Oral Q4H PRN Nira Conn A, NP      . ARIPiprazole (ABILIFY) tablet 2 mg  2 mg Oral Daily Cobos, Rockey Situ, MD   2 mg at 05/31/18 0747  . escitalopram (LEXAPRO) tablet 20 mg  20 mg Oral Daily Cobos, Rockey Situ, MD   20 mg at 05/31/18 0747  . hydrOXYzine (ATARAX/VISTARIL) tablet 25 mg  25 mg Oral TID PRN Jackelyn Poling, NP   25 mg at 05/30/18 2143  . magnesium hydroxide (MILK OF MAGNESIA) suspension 30 mL  30 mL Oral Daily PRN Nira Conn A, NP      . metFORMIN (GLUCOPHAGE) tablet 500 mg  500 mg Oral Q breakfast Cobos, Rockey Situ, MD   500 mg at 05/31/18 0747  . traZODone (DESYREL) tablet 50 mg  50 mg Oral QHS PRN Jackelyn Poling, NP   50 mg at 05/30/18 2143    Lab Results:  Results for orders placed or performed during the hospital encounter of 05/28/18 (from the past 48 hour(s))  Glucose, capillary     Status: Abnormal   Collection Time: 05/30/18  6:07 AM  Result Value Ref Range   Glucose-Capillary 132 (H) 70 - 99 mg/dL  Glucose, capillary     Status: Abnormal   Collection Time: 05/31/18  6:23 AM  Result Value Ref Range   Glucose-Capillary 137 (H) 70 - 99 mg/dL   Comment 1 Notify RN    Comment 2 Document in Chart     Blood Alcohol level:  Lab Results  Component Value Date   ETH <10 05/28/2018    Metabolic Disorder Labs: Lab Results  Component Value Date   HGBA1C 6.5 (H) 05/29/2018   MPG 139.85 05/29/2018   No results found for: PROLACTIN Lab Results  Component Value Date   CHOL 251 (H) 05/29/2018   TRIG 172 (H) 05/29/2018   HDL 69 05/29/2018   CHOLHDL 3.6 05/29/2018    VLDL 34 05/29/2018   LDLCALC 148 (H) 05/29/2018    Physical Findings: AIMS: Facial and Oral Movements Muscles of Facial Expression: None, normal Lips and Perioral Area: None, normal Jaw: None, normal Tongue: None, normal,Extremity Movements Upper (arms, wrists, hands, fingers): None, normal Lower (legs, knees, ankles,  toes): None, normal, Trunk Movements Neck, shoulders, hips: None, normal, Overall Severity Severity of abnormal movements (highest score from questions above): None, normal Incapacitation due to abnormal movements: None, normal Patient's awareness of abnormal movements (rate only patient's report): No Awareness, Dental Status Current problems with teeth and/or dentures?: No Does patient usually wear dentures?: No  CIWA:  CIWA-Ar Total: 1 COWS:  COWS Total Score: 1  Musculoskeletal: Strength & Muscle Tone: within normal limits Gait & Station: normal Patient leans: N/A  Psychiatric Specialty Exam: Physical Exam  Nursing note and vitals reviewed. Constitutional: She is oriented to person, place, and time. She appears well-developed and well-nourished.  Cardiovascular: Normal rate.  Respiratory: Effort normal.  Neurological: She is alert and oriented to person, place, and time.    Review of Systems  Constitutional: Negative.   Respiratory: Negative.   Cardiovascular: Negative.   Psychiatric/Behavioral: Negative for depression, hallucinations, memory loss, substance abuse and suicidal ideas. The patient is not nervous/anxious and does not have insomnia.     Blood pressure (!) 128/93, pulse 89, temperature 97.6 F (36.4 C), temperature source Oral, resp. rate 16, height 5\' 2"  (1.575 m), weight 103.4 kg, last menstrual period 05/02/2018.Body mass index is 41.7 kg/m.  General Appearance: Well Groomed  Eye Contact:  Good  Speech:  Normal Rate  Volume:  Normal  Mood:  Euthymic  Affect:  Congruent and Full Range  Thought Process:  Coherent  Orientation:  Full  (Time, Place, and Person)  Thought Content:  WDL  Suicidal Thoughts:  No  Homicidal Thoughts:  No  Memory:  Immediate;   Good  Judgement:  Fair  Insight:  Fair  Psychomotor Activity:  Normal  Concentration:  Concentration: Good  Recall:  Good  Fund of Knowledge:  Fair  Language:  Good  Akathisia:  No  Handed:  Right  AIMS (if indicated):     Assets:  Communication Skills Desire for Improvement Housing Social Support  ADL's:  Intact  Cognition:  WNL  Sleep:  Number of Hours: 6.75     Treatment Plan Summary: Daily contact with patient to assess and evaluate symptoms and progress in treatment and Medication management   Continue inpatient hospitalization.  MDD: Continue Lexapro 20 mg PO daily Continue Abilify 2 mg PO daily  Anxiety: Continue Vistaril 25 mg PO Q6HR PRN  Sleep: Continue trazodone 50 mg PO QHS PRN   Diabetes: Continue metformin 500 mg PO daily  Patient will participate in the therapeutic group milieu.  Discharge disposition in progress. CSW to obtain collateral from husband.  Aldean BakerJanet E Mccayla Shimada, NP 05/31/2018, 11:44 AM

## 2018-05-31 NOTE — Progress Notes (Signed)
Patient self inventory- Patient slept well last night, sleep medication was requested and was helpful. Appetite is good, energy level normal, concentration good. Depression, hopelessness, and anxiety all rated 0/10. Denies physical pain and problems. Denies SI HI AVH. Patient's goal is "getting to go home." Patient is compliant with medications. No side effects noted. Safety is maintained with 15 minute checks as well as environmental checks. Will continue to monitor and provide support.

## 2018-05-31 NOTE — [facility] Suicide Risk Assessment (Addendum)
[facility] INPATIENT:  Family/Significant Other Suicide Prevention Education  Suicide Prevention Education:  Education Completed; Agustina Caroli, husband [phone])  has been identified by the patient as the family member/significant other with whom the patient will be residing, and identified as the person(s) who will aid the patient in the event of a mental health crisis (suicidal ideations/suicide attempt).  With written consent from the patient, the family member/significant other has been provided the following suicide prevention education, prior to the and/or following the discharge of the patient.  The suicide prevention education provided includes the following:  Suicide risk factors  Suicide prevention and interventions  National Suicide Hotline telephone number  [facility] assessment telephone number  [facility] Emergency Assistance 911  [facility] and/or Residential Mobile Crisis Unit telephone number  Request made of family/significant other to:  Remove weapons (e.g., guns, rifles, knives), all items previously/currently identified as safety concern.    Remove drugs/medications (over-the-counter, prescriptions, illicit drugs), all items previously/currently identified as a safety concern.  The family member/significant other verbalizes understanding of the suicide prevention education information provided.  The family member/significant other agrees to remove the items of safety concern listed above.  The patient's husband reports that he does not have any concerns regarding the patient's tentative discharge for tomorrow. He states that he believes the patient's main issue is struggling with her mother's unexpected passing. He reports that the patient has not attempted or expressed suicidal ideation prior to this hospitalization. The patient's husband states that he is ready for the patient to discharge.       Natalie Kim 05/31/2018, 2:24 PM

## 2018-05-31 NOTE — Progress Notes (Signed)
Nursing Progress Note: 7p-7a D: Pt currently presents with a pleasant affect and behavior. Pt states "I thought I was supposed to leave today, but I guess not. I feel slighted by the doctor, but it's fine. I am going to stay positive." Interacting appropriately with the milieu. Pt reports good sleep during the previous night with current medication regimen. Pt did attend wrap-up group.  A: Pt provided with medications per providers orders. Pt's labs and vitals were monitored throughout the night. Pt supported emotionally and encouraged to express concerns and questions. Pt educated on medications.  R: Pt's safety ensured with 15 minute and environmental checks. Pt currently denies SI, HI, and AVH. Pt verbally contracts to seek staff if SI,HI, or AVH occurs and to consult with staff before acting on any harmful thoughts. Will continue to monitor.

## 2018-05-31 NOTE — [facility] Group Notes (Signed)
Adult Psychoeducational Group Note  Date:  05/31/2018 Time:  10:01 PM  Group Topic/Focus:  Wrap-Up Group:   The focus of this group is to help patients review their daily goal of treatment and discuss progress on daily workbooks.  Participation Level:  Active  Participation Quality:  Appropriate and Attentive  Affect:  Appropriate  Cognitive:  Alert and Appropriate  Insight: Appropriate and Good  Engagement in Group:  Engaged  Modes of Intervention:  Discussion and Education  Additional Comments:  Pt attended and participated in wrap up group this veening. Pt rated their day a 10/10, even though they did not have enough communication that they needed from their Dr and SW. Pt goal was to work on a discharge plan, but the lack of communication hindered that goal.   Chrisandra Netters 05/31/2018, 10:01 PM

## 2018-05-31 NOTE — Plan of Care (Signed)
  Problem: Education: Goal: Knowledge of [facility] Education information/materials will improve Outcome: Progressing Goal: Emotional status will improve Outcome: Progressing Goal: Mental status will improve Outcome: Progressing Goal: Verbalization of understanding the information provided will improve Outcome: Progressing   Problem: Activity: Goal: Interest or engagement in activities will improve Outcome: Progressing   

## 2018-06-01 DIAGNOSIS — F332 Major depressive disorder, recurrent severe without psychotic features: Secondary | ICD-10-CM | POA: Diagnosis not present

## 2018-06-01 DIAGNOSIS — F419 Anxiety disorder, unspecified: Secondary | ICD-10-CM | POA: Diagnosis not present

## 2018-06-01 DIAGNOSIS — G4700 Insomnia, unspecified: Secondary | ICD-10-CM | POA: Diagnosis not present

## 2018-06-01 DIAGNOSIS — R45851 Suicidal ideations: Secondary | ICD-10-CM | POA: Diagnosis not present

## 2018-06-01 LAB — GLUCOSE, CAPILLARY: Glucose-Capillary: 147 mg/dL — ABNORMAL HIGH (ref 70–99)

## 2018-06-01 MED ORDER — HYDROXYZINE HCL 25 MG PO TABS: 25.0000 mg | ORAL_TABLET | Freq: Three times a day (TID) | ORAL | 0 refills | Status: DC | PRN

## 2018-06-01 MED ORDER — ESCITALOPRAM OXALATE 20 MG PO TABS: 20.0000 mg | ORAL_TABLET | Freq: Every day | ORAL | 0 refills | Status: DC

## 2018-06-01 MED ORDER — METFORMIN HCL 500 MG PO TABS: 500.0000 mg | ORAL_TABLET | Freq: Every day | ORAL | 0 refills | Status: DC

## 2018-06-01 MED ORDER — ARIPIPRAZOLE 2 MG PO TABS: 2.0000 mg | ORAL_TABLET | Freq: Every day | ORAL | 0 refills | Status: DC

## 2018-06-01 MED ORDER — TRAZODONE HCL 50 MG PO TABS: 50.0000 mg | ORAL_TABLET | Freq: Every evening | ORAL | 0 refills | Status: DC | PRN

## 2018-06-01 NOTE — Discharge Summary (Signed)
Physician Discharge Summary Note  Patient:  Dia CrawfordKimberly L Langill is an 48 y.o., female MRN:  [id number] DOB:  Dec 17, 1970 Patient phone:  [phone] (home)  Patient address:   [address] [place] [id number],  Total Time spent with patient: 15 minutes  Date of Admission:  05/28/2018 Date of Discharge: 06/01/2018  Reason for Admission:  Suicide attempt via hanging  Principal Problem: Severe recurrent major depression without psychotic features [facility](HCC) Discharge Diagnoses: Principal Problem:   Severe recurrent major depression without psychotic features (HCC) Active Problems:   Suicidal ideations   Past Psychiatric History: Per admission H&P: Anxiety and depression since her early 7620s. Denies prior hospitalizations, suicide attempts, AVH, or mania.  Past Medical History:  Past Medical History:  Diagnosis Date  . Arthritis    upper spine  . Asthma    no inhaler x 3 yrs  . Depression    D/C Prozac in December  . Diabetes mellitus without complication (HCC) 2014   gestational, Type 2  . Headache(784.0)   . Heartburn in pregnancy   . Hyperlipidemia   . Hypertension    Lisinopril (weaned off by December)  . Restless legs syndrome     Past Surgical History:  Procedure Laterality Date  . CESAREAN SECTION N/A 01/31/2013   Procedure: PRIMARY CESAREAN SECTION;  Surgeon: Brock Badharles A Harper, MD;  Location: [facility] ORS;  Service: Obstetrics;  Laterality: N/A;  . CHOLECYSTECTOMY N/A 03/18/2016   Procedure: LAPAROSCOPIC CHOLECYSTECTOMY WITH INTRAOPERATIVE CHOLANGIOGRAM;  Surgeon: Chevis PrettyPaul Toth III, MD;  Location: [facility] OR;  Service: General;  Laterality: N/A;  . MOUTH SURGERY     Family History:  Family History  Problem Relation Age of Onset  . Hypertension Mother   . Heart murmur Mother   . High Cholesterol Mother   . Prostate cancer Father   . Cancer Maternal Grandmother   . Cancer Maternal Grandfather    Family Psychiatric  History: Per admission H&P: Denies Social  History:  Social History   Substance and Sexual Activity  Alcohol Use No     Social History   Substance and Sexual Activity  Drug Use No    Social History   Socioeconomic History  . Marital status: Married    Spouse name: Not on file  . Number of children: 1  . Years of education: Not on file  . Highest education level: Not on file  Occupational History  . Occupation: Print production plannerffice Manager  Social Needs  . Financial resource strain: Not on file  . Food insecurity:    Worry: Not on file    Inability: Not on file  . Transportation needs:    Medical: Not on file    Non-medical: Not on file  Tobacco Use  . Smoking status: Never Smoker  . Smokeless tobacco: Never Used  Substance and Sexual Activity  . Alcohol use: No  . Drug use: No  . Sexual activity: Yes    Partners: Male    Birth control/protection: Other-see comments    Comment: reports H had vasectomy  Lifestyle  . Physical activity:    Days per week: Not on file    Minutes per session: Not on file  . Stress: Not on file  Relationships  . Social connections:    Talks on phone: Not on file    Gets together: Not on file    Attends religious service: Not on file    Active member of club or organization: Not on file    Attends  meetings of clubs or organizations: Not on file    Relationship status: Not on file  Other Topics Concern  . Not on file  Social History Narrative  . Not on file    Hospital Course:  Per admission H&P 05/29/2018: Ms. Coufal is a 48 year old married female with history of depression, asthma, migraines, and type 2 diabetes presenting for treatment after suicide attempt via hanging. Reports- "It was just a normal day, but I got into an argument with my daughter and I just snapped. I've been thinking a lot about my mom [who died 09-18-2017 the last couple weeks  and I wanted to be with her." Per IVC paperwork patient tried to hang herself twice. Patient reports going to her room during the argument  with her daughter, stepping on a stepladder and carrying a long-sleeved shirt to tie. She states her husband followed her and stopped her while the daughter called EMS. Today she denies SI and reports "I don't think I would have hung myself. That was my way of saying I need help." She has long history of depression but denies history of suicidal thoughts prior to yesterday. She does report increased depression and anxiety since her mother's death. Depression became more severe over the holidays especially, with insomnia, anhedonia, and panic attacks. States she does have a support system but misses the type of support she had from her mother. Reports compliance with Lexapro 20 mg PO daily for years and feels this medication has been helpful. Denies ETOH, substance use. Denies HI, AVH.  Ms. Boxell was admitted for suicide attempt via hanging. Lexapro was continued. Abilify was started for augmentation. PRN Vistaril was started for anxiety, and PRN trazodone was started for sleep. Ms. Studer remained on the [facility] unit for 4 days. She stabilized with medication and therapy. Collateral information was obtained from both her daughter and husband, who both agreed to the discharge plan. She was discharged with the medications listed below. She has shown improvement with improved mood, affect, sleep, appetite, and interaction. She denies any SI/HI/AVH and contracts for safety. She agrees to follow up at [facility]. Patient is provided with prescriptions for medications upon discharge.  Physical Findings: AIMS: Facial and Oral Movements Muscles of Facial Expression: None, normal Lips and Perioral Area: None, normal Jaw: None, normal Tongue: None, normal,Extremity Movements Upper (arms, wrists, hands, fingers): None, normal Lower (legs, knees, ankles, toes): None, normal, Trunk Movements Neck, shoulders, hips: None, normal, Overall Severity Severity of abnormal movements (highest score from questions above): None,  normal Incapacitation due to abnormal movements: None, normal Patient's awareness of abnormal movements (rate only patient's report): No Awareness, Dental Status Current problems with teeth and/or dentures?: No Does patient usually wear dentures?: No  CIWA:  CIWA-Ar Total: 1 COWS:  COWS Total Score: 1  Musculoskeletal: Strength & Muscle Tone: within normal limits Gait & Station: normal Patient leans: N/A  Psychiatric Specialty Exam: Physical Exam  Nursing note and vitals reviewed. Constitutional: She is oriented to person, place, and time. She appears well-developed and well-nourished.  Cardiovascular: Normal rate.  Respiratory: Effort normal.  Neurological: She is alert and oriented to person, place, and time.    Review of Systems  Constitutional: Negative.   Respiratory: Negative.   Cardiovascular: Negative.   Psychiatric/Behavioral: Negative for depression, hallucinations, memory loss, substance abuse and suicidal ideas. The patient is not nervous/anxious and does not have insomnia.     Blood pressure 126/90, pulse 90, temperature 97.6 F (36.4 C), temperature source Oral,  resp. rate 16, height 5\' 2"  (1.575 m), weight 103.4 kg, last menstrual period 05/02/2018.Body mass index is 41.7 kg/m.  See MD's discharge SRA     Have you used any form of tobacco in the last 30 days? (Cigarettes, Smokeless Tobacco, Cigars, and/or Pipes): No  Has this patient used any form of tobacco in the last 30 days? (Cigarettes, Smokeless Tobacco, Cigars, and/or Pipes)  No  Blood Alcohol level:  Lab Results  Component Value Date   ETH <10 05/28/2018    Metabolic Disorder Labs:  Lab Results  Component Value Date   HGBA1C 6.5 (H) 05/29/2018   MPG 139.85 05/29/2018   No results found for: PROLACTIN Lab Results  Component Value Date   CHOL 251 (H) 05/29/2018   TRIG 172 (H) 05/29/2018   HDL 69 05/29/2018   CHOLHDL 3.6 05/29/2018   VLDL 34 05/29/2018   LDLCALC 148 (H) 05/29/2018    See  Psychiatric Specialty Exam and Suicide Risk Assessment completed by Attending Physician prior to discharge.  Discharge destination:  Home  Is patient on multiple antipsychotic therapies at discharge:  No   Has Patient had three or more failed trials of antipsychotic monotherapy by history:  No  Recommended Plan for Multiple Antipsychotic Therapies: NA  Discharge Instructions    Discharge instructions   Complete by:  As directed    Patient is instructed to take all prescribed medications as recommended. Report any side effects or adverse reactions to your outpatient psychiatrist. Patient is instructed to abstain from alcohol and illegal drugs while on prescription medications. In the event of worsening symptoms, patient is instructed to call the crisis hotline, 911, or go to the nearest emergency department for evaluation and treatment.     Allergies as of 06/01/2018      Reactions   Latex Itching, Swelling   SWELLING REACTION UNSPECIFIED    Augmentin [amoxicillin-pot Clavulanate]    Sick and nauseous   Other Other (See Comments)   Pt reports allergy to an antibiotic in the penicillin family, unsure of actual medication and reaction.  It was when she was a child      Medication List    STOP taking these medications   aspirin-acetaminophen-caffeine 250-250-65 MG tablet Commonly known as:  EXCEDRIN MIGRAINE   clotrimazole-betamethasone cream Commonly known as:  LOTRISONE   HYDROcodone-acetaminophen 5-325 MG tablet Commonly known as:  NORCO/VICODIN   predniSONE 10 MG (21) Tbpk tablet Commonly known as:  STERAPRED UNI-PAK 21 TAB     TAKE these medications     Indication  ARIPiprazole 2 MG tablet Commonly known as:  ABILIFY Take 1 tablet (2 mg total) by mouth daily. For mood Start taking on:  June 02, 2018  Indication:  Mood   escitalopram 20 MG tablet Commonly known as:  LEXAPRO Take 1 tablet (20 mg total) by mouth daily. For mood What changed:  additional  instructions  Indication:  Mood   hydrOXYzine 25 MG tablet Commonly known as:  ATARAX/VISTARIL Take 1 tablet (25 mg total) by mouth 3 (three) times daily as needed for anxiety.  Indication:  Feeling Anxious   metFORMIN 500 MG tablet Commonly known as:  GLUCOPHAGE Take 1 tablet (500 mg total) by mouth daily with breakfast. For blood sugar Start taking on:  June 02, 2018  Indication:  Type 2 Diabetes   montelukast 10 MG tablet Commonly known as:  SINGULAIR Take 10 mg by mouth daily as needed (congestion).  Indication:  Hayfever   traZODone 50 MG  tablet Commonly known as:  DESYREL Take 1 tablet (50 mg total) by mouth at bedtime as needed for sleep.  Indication:  Trouble Sleeping      Follow-up [name]. Go on 06/07/2018.   Specialty:  Behavioral Health Why:  Your hospital follow up appointment is Wednesday, 1/22 at 8:00a. Please bring: photo ID, proof of insurance, SSN, and any discharge paperwork from this hospitalization.  [address] [place] [address] [phone]           Follow-up recommendations: Activity as tolerated. Diet as recommended by primary care physician. Keep all scheduled follow-up appointments as recommended.   Comments:   Patient is instructed to take all prescribed medications as recommended. Report any side effects or adverse reactions to your outpatient psychiatrist. Patient is instructed to abstain from alcohol and illegal drugs while on prescription medications. In the event of worsening symptoms, patient is instructed to call the crisis hotline, 911, or go to the nearest emergency department for evaluation and treatment.  Signed: Aldean Baker, NP 06/01/2018, 8:24 AM

## 2018-06-01 NOTE — [facility] Suicide Risk Assessment (Signed)
[facility] Discharge Suicide Risk Assessment   Principal Problem: Severe recurrent major depression without psychotic features [facility]) Discharge Diagnoses: Principal Problem:   Severe recurrent major depression without psychotic features (HCC) Active Problems:   Suicidal ideations   Total Time spent with patient: 20 minutes  Musculoskeletal: Strength & Muscle Tone: within normal limits Gait & Station: normal Patient leans: N/A  Psychiatric Specialty Exam: Review of Systems  All other systems reviewed and are negative.   Blood pressure 126/90, pulse 90, temperature 97.6 F (36.4 C), temperature source Oral, resp. rate 16, height 5\' 2"  (1.575 m), weight 103.4 kg, last menstrual period 05/02/2018.Body mass index is 41.7 kg/m.  General Appearance: Casual  Eye Contact::  Good  Speech:  Normal Rate409  Volume:  Normal  Mood:  Euthymic  Affect:  Congruent  Thought Process:  Coherent and Descriptions of Associations: Intact  Orientation:  Full (Time, Place, and Person)  Thought Content:  Logical  Suicidal Thoughts:  No  Homicidal Thoughts:  No  Memory:  Immediate;   Fair Recent;   Fair Remote;   Fair  Judgement:  Intact  Insight:  Fair  Psychomotor Activity:  Normal  Concentration:  Fair  Recall:  Good  Fund of Knowledge:Good  Language: Good  Akathisia:  Negative  Handed:  Right  AIMS (if indicated):     Assets:  Communication Skills Desire for Improvement Financial Resources/Insurance Housing Physical Health Resilience Social Support  Sleep:  Number of Hours: 6.5  Cognition: WNL  ADL's:  Intact   Mental Status Per Nursing Assessment::   On Admission:  NA  Demographic Factors:  Caucasian  Loss Factors: Loss of significant relationship  Historical Factors: Impulsivity  Risk Reduction Factors:   Sense of responsibility to family, Living with another person, especially a relative, Positive social support and Positive coping skills or problem solving  skills  Continued Clinical Symptoms:  Depression:   Impulsivity  Cognitive Features That Contribute To Risk:  None    Suicide Risk:  Minimal: No identifiable suicidal ideation.  Patients presenting with no risk factors but with morbid ruminations; may be classified as minimal risk based on the severity of the depressive symptoms  Follow-up Information    [facility]. Go on 06/07/2018.   Specialty:  Behavioral Health Why:  Your hospital follow up appointment is Wednesday, 1/22 at 8:00a. Please bring: photo ID, proof of insurance, SSN, and any discharge paperwork from this hospitalization.  Contact information: [address] [place] [address] [phone]           Plan Of Care/Follow-up recommendations:  Activity:  ad lib  Antonieta Pert, MD 06/01/2018, 7:44 AM

## 2018-06-01 NOTE — Progress Notes (Signed)
Pt denies SI/HI. Pt reported no depression or anxiety today. Pt verbalized readiness to discharge home today.   Pt received both written and verbal discharge instructions. Pt verbalized understanding of discharge instructions. Pt agreed to f/u appt and med regimen. Pt received prescriptions, SRA, AVS, transitional record and a suicide prevention form. Pt gathered belongings from room and was safely discharged to the lobby.

## 2018-06-07 DIAGNOSIS — F411 Generalized anxiety disorder: Secondary | ICD-10-CM | POA: Diagnosis not present

## 2018-06-16 DIAGNOSIS — F332 Major depressive disorder, recurrent severe without psychotic features: Secondary | ICD-10-CM | POA: Diagnosis not present

## 2018-06-16 DIAGNOSIS — F411 Generalized anxiety disorder: Secondary | ICD-10-CM | POA: Diagnosis not present

## 2018-10-17 ENCOUNTER — Ambulatory Visit: Payer: Medicaid Other | Attending: Internal Medicine | Admitting: Internal Medicine

## 2018-10-17 ENCOUNTER — Other Ambulatory Visit: Payer: Self-pay

## 2018-10-17 ENCOUNTER — Encounter: Payer: Self-pay | Admitting: Internal Medicine

## 2018-10-17 VITALS — BP 130/80 | HR 77 | Temp 98.2°F | Resp 16 | Ht 62.0 in | Wt 247.6 lb

## 2018-10-17 DIAGNOSIS — E785 Hyperlipidemia, unspecified: Secondary | ICD-10-CM | POA: Insufficient documentation

## 2018-10-17 DIAGNOSIS — Z6841 Body Mass Index (BMI) 40.0 and over, adult: Secondary | ICD-10-CM | POA: Diagnosis not present

## 2018-10-17 DIAGNOSIS — E119 Type 2 diabetes mellitus without complications: Secondary | ICD-10-CM

## 2018-10-17 DIAGNOSIS — R45851 Suicidal ideations: Secondary | ICD-10-CM | POA: Diagnosis not present

## 2018-10-17 DIAGNOSIS — Z79899 Other long term (current) drug therapy: Secondary | ICD-10-CM | POA: Insufficient documentation

## 2018-10-17 DIAGNOSIS — F32A Depression, unspecified: Secondary | ICD-10-CM

## 2018-10-17 DIAGNOSIS — E1169 Type 2 diabetes mellitus with other specified complication: Secondary | ICD-10-CM | POA: Insufficient documentation

## 2018-10-17 DIAGNOSIS — Z7984 Long term (current) use of oral hypoglycemic drugs: Secondary | ICD-10-CM | POA: Insufficient documentation

## 2018-10-17 DIAGNOSIS — F419 Anxiety disorder, unspecified: Secondary | ICD-10-CM

## 2018-10-17 DIAGNOSIS — I10 Essential (primary) hypertension: Secondary | ICD-10-CM | POA: Diagnosis not present

## 2018-10-17 DIAGNOSIS — L84 Corns and callosities: Secondary | ICD-10-CM | POA: Insufficient documentation

## 2018-10-17 DIAGNOSIS — F329 Major depressive disorder, single episode, unspecified: Secondary | ICD-10-CM | POA: Diagnosis not present

## 2018-10-17 DIAGNOSIS — F4310 Post-traumatic stress disorder, unspecified: Secondary | ICD-10-CM | POA: Diagnosis not present

## 2018-10-17 DIAGNOSIS — Z8249 Family history of ischemic heart disease and other diseases of the circulatory system: Secondary | ICD-10-CM | POA: Diagnosis not present

## 2018-10-17 LAB — POCT GLYCOSYLATED HEMOGLOBIN (HGB A1C): HbA1c, POC (controlled diabetic range): 6.9 % (ref 0.0–7.0)

## 2018-10-17 LAB — GLUCOSE, POCT (MANUAL RESULT ENTRY): POC Glucose: 153 mg/dl — AB (ref 70–99)

## 2018-10-17 MED ORDER — METFORMIN HCL 500 MG PO TABS: 500.0000 mg | ORAL_TABLET | Freq: Every day | ORAL | 6 refills | Status: DC

## 2018-10-17 MED ORDER — ACCU-CHEK FASTCLIX LANCET KIT: PACK | 0 refills | Status: AC

## 2018-10-17 MED ORDER — HYDROXYZINE HCL 25 MG PO TABS: 25.0000 mg | ORAL_TABLET | Freq: Three times a day (TID) | ORAL | 6 refills | Status: DC | PRN

## 2018-10-17 MED ORDER — TRAZODONE HCL 50 MG PO TABS: 50.0000 mg | ORAL_TABLET | Freq: Every evening | ORAL | 6 refills | Status: DC | PRN

## 2018-10-17 MED ORDER — ACCU-CHEK GUIDE ME W/DEVICE KIT: 1.0000 | PACK | Freq: Every day | 0 refills | Status: AC

## 2018-10-17 MED ORDER — ESCITALOPRAM OXALATE 20 MG PO TABS: 20.0000 mg | ORAL_TABLET | Freq: Every day | ORAL | 6 refills | Status: DC

## 2018-10-17 MED ORDER — ACCU-CHEK FASTCLIX LANCETS MISC: 1 refills | Status: AC

## 2018-10-17 MED ORDER — ARIPIPRAZOLE 2 MG PO TABS: 2.0000 mg | ORAL_TABLET | Freq: Every day | ORAL | 3 refills | Status: DC

## 2018-10-17 MED ORDER — GLUCOSE BLOOD VI STRP: ORAL_STRIP | 12 refills | Status: AC

## 2018-10-17 NOTE — Progress Notes (Signed)
[id number] A1C-6.9

## 2018-10-17 NOTE — Patient Instructions (Signed)
Please sign a release for [place] to get your medical records from your previous PCP.  Please speak with your psychiatrist about weight gain associated with Abilify.  Follow a Healthy Eating Plan - You can do it! Limit sugary drinks.  Avoid sodas, sweet tea, sport or energy drinks, or fruit drinks.  Drink water, lo-fat milk, or diet drinks. Limit snack foods.   Cut back on candy, cake, cookies, chips, ice cream.  These are a special treat, only in small amounts. Eat plenty of vegetables.  Especially dark green, red, and orange vegetables. Aim for at least 3 servings a day. More is better! Include fruit in your daily diet.  Whole fruit is much healthier than fruit juice! Limit "white" bread, "white" pasta, "white" rice.   Choose "100% whole grain" products, brown or wild rice. Avoid fatty meats. Try "Meatless Monday" and choose eggs or beans one day a week.  When eating meat, choose lean meats like chicken, [place], and fish.  Grill, broil, or bake meats instead of frying, and eat poultry without the skin. Eat less salt.  Avoid frozen pizzas, frozen dinners and salty foods.  Use seasonings other than salt in cooking.  This can help blood pressure and keep you from swelling Beer, wine and liquor have calories.  If you can safely drink alcohol, limit to 1 drink per day for women, 2 drinks for men

## 2018-10-17 NOTE — Progress Notes (Signed)
Patient ID: Natalie Kim, female    DOB: April 12, 1971  MRN: [id number]  CC: new pt for chronic ds management  Subjective: Natalie Kim is a 48 y.o. female who presents for new pt visit for chronic ds management Her concerns today include:  pt with hx of major dep/anx, DM  Previous PCP was [facility].    Pt loss insurance, got Medicaid and PCP does not take Medicaid.  So patient decided to establish care here. Pt was being treated for dep, anxiety, PTSD, DM.   She is here because she needs refill on her psychiatric medications and for management of her diabetes.    Pression/anxiety: Mom died last 09/16/2022 and she had hard time dealing with it.  Had "a mental breakdown in January and I check myself into"  the hosp.  Pt was with major depression and suicide attempt.  She was released after stabilized on meds that included hydroxyzine, Lexapro, trazodone and Abilify.  was seeing psychiatrist and therapist at [facility].  Speaks with therapist there Q mth but they have dischg her in terms of meds management and told her that she can get her meds from PCP. Feels stable on current medication regiment.  Denies suicidal ideation/HI  Dx with DM 3-4 yrs ago Gain 40+ lbs in past yr.  "Since my mom died it seems like all I want to do is eat eat eat." Not getting in any exercise.  Lacks energy which she feels is due to the depression.  Has a 6 yr old daughter On Metformin x several mths and taking consistently No device to check BS Last eye over 1 yr. no blurred vision. Denies any numbness in the feet or hands.  Does have bad calluses on both feet.  Reports history of hypertension.  She was on blood pressure medication in the past but was able to be weaned off of it.  Blood pressure was good until about the early part of this year.  HM:  Last pap and MMG  done a little over 1 yr ago with Dr. Willette Pa.    Past medical history, surgical history, social history, family history reviewed and updated   Patient Active Problem List   Diagnosis Date Noted  . Suicidal ideations   . Severe recurrent major depression without psychotic features ([place]) 05/28/2018  . Cesarean delivery delivered 01/31/2013  . Abnormal maternal glucose tolerance, antepartum 11/15/2012  . Gestational diabetes 10/26/2012  . Elderly multigravida with antepartum condition or complication 28/63/8177     Current Outpatient Medications on File Prior to Visit  Medication Sig Dispense Refill  . montelukast (SINGULAIR) 10 MG tablet Take 10 mg by mouth daily as needed (congestion).     No current facility-administered medications on file prior to visit.     Allergies  Allergen Reactions  . Latex Itching and Swelling    SWELLING REACTION UNSPECIFIED   . Augmentin [Amoxicillin-Pot Clavulanate]     Sick and nauseous  . Other Other (See Comments)    Pt reports allergy to an antibiotic in the penicillin family, unsure of actual medication and reaction.  It was when she was a child    Social History   Socioeconomic History  . Marital status: Married    Spouse name: Not on file  . Number of children: 2  . Years of education: some college  . Highest education level: Not on file  Occupational History  . Occupation: unemployed  Social Needs  . Financial resource strain: Not on  file  . Food insecurity:    Worry: Not on file    Inability: Not on file  . Transportation needs:    Medical: Not on file    Non-medical: Not on file  Tobacco Use  . Smoking status: Never Smoker  . Smokeless tobacco: Never Used  Substance and Sexual Activity  . Alcohol use: No  . Drug use: No  . Sexual activity: Yes    Partners: Male    Birth control/protection: Other-see comments    Comment: reports H had vasectomy  Lifestyle  . Physical activity:    Days per week: Not on file    Minutes per session: Not on file  . Stress: Not on file  Relationships  . Social connections:    Talks on phone: Not on file    Gets together: Not on  file    Attends religious service: Not on file    Active member of club or organization: Not on file    Attends meetings of clubs or organizations: Not on file    Relationship status: Not on file  . Intimate partner violence:    Fear of current or ex partner: Not on file    Emotionally abused: Not on file    Physically abused: Not on file    Forced sexual activity: Not on file  Other Topics Concern  . Not on file  Social History Narrative  . Not on file    Family History  Problem Relation Age of Onset  . Hypertension Mother   . Heart murmur Mother   . High Cholesterol Mother   . Lung cancer Mother   . Prostate cancer Father   . Cancer Maternal Grandmother   . Cancer Maternal Grandfather     Past Surgical History:  Procedure Laterality Date  . CESAREAN SECTION N/A 01/31/2013   Procedure: PRIMARY CESAREAN SECTION;  Surgeon: Shelly Bombard, MD;  Location: [facility];  Service: Obstetrics;  Laterality: N/A;  . CHOLECYSTECTOMY N/A 03/18/2016   Procedure: LAPAROSCOPIC CHOLECYSTECTOMY WITH INTRAOPERATIVE CHOLANGIOGRAM;  Surgeon: Autumn Messing III, MD;  Location: [facility];  Service: General;  Laterality: N/A;  . MOUTH SURGERY      ROS: Review of Systems  Eyes: Negative for visual disturbance.  Respiratory: Negative for shortness of breath.   Cardiovascular: Negative for chest pain and leg swelling.  Gastrointestinal: Negative for abdominal pain.   Negative except as stated above  PHYSICAL EXAM: BP 130/80   Pulse 77   Temp 98.2 F (36.8 C) (Oral)   Resp 16   Ht '5\' 2"'  (2.706 m)   Wt 247 lb 9.6 oz (112.3 kg)   SpO2 95%   BMI 45.29 kg/m   Physical Exam General appearance - alert, well appearing, obese middle age caucasian FM and in no distress Mental status - normal mood, behavior, speech, dress, motor activity, and thought processes Eyes - pupils equal and reactive, extraocular eye movements intact Nose - normal and patent, no erythema, discharge or polyps Mouth - mucous  membranes moist, pharynx normal without lesions Neck - supple, no significant adenopathy Chest - clear to auscultation, no wheezes, rales or rhonchi, symmetric air entry Heart - normal rate, regular rhythm, normal S1, S2, no murmurs, rubs, clicks or gallops Abdomen - soft, nontender, nondistended, no masses or organomegaly Extremities - peripheral pulses normal, no pedal edema, no clubbing or cyanosis Diabetic Foot Exam - Simple   Simple Foot Form Visual Inspection See comments:  Yes Sensation Testing Intact to touch and  monofilament testing bilaterally:  Yes Pulse Check Posterior Tibialis and Dorsalis pulse intact bilaterally:  Yes Comments Patient has several calluses on the soles of both feet.  She has very thick calluses with cracking the skin on the plantar heels      CMP Latest Ref Rng & Units 05/28/2018 07/30/2017 03/16/2016  Glucose 70 - 99 mg/dL 127(H) 148(H) 121(H)  BUN 6 - 20 mg/dL '15 9 14  ' Creatinine 0.44 - 1.00 mg/dL 0.77 0.84 0.69  Sodium 135 - 145 mmol/L 141 131(L) 139  Potassium 3.5 - 5.1 mmol/L 4.0 3.6 3.9  Chloride 98 - 111 mmol/L 106 99(L) 108  CO2 22 - 32 mmol/L 27 19(L) 24  Calcium 8.9 - 10.3 mg/dL 9.6 8.7(L) 9.3  Total Protein 6.5 - 8.1 g/dL 7.6 7.5 -  Total Bilirubin 0.3 - 1.2 mg/dL 1.3(H) 1.0 -  Alkaline Phos 38 - 126 U/L 51 55 -  AST 15 - 41 U/L 27 38 -  ALT 0 - 44 U/L 21 23 -   Lipid Panel     Component Value Date/Time   CHOL 251 (H) 05/29/2018 0640   TRIG 172 (H) 05/29/2018 0640   HDL 69 05/29/2018 0640   CHOLHDL 3.6 05/29/2018 0640   VLDL 34 05/29/2018 0640   LDLCALC 148 (H) 05/29/2018 0640    CBC    Component Value Date/Time   WBC 7.7 05/28/2018 1651   RBC 5.30 (H) 05/28/2018 1651   HGB 15.7 (H) 05/28/2018 1651   HCT 47.4 (H) 05/28/2018 1651   PLT 214 05/28/2018 1651   MCV 89.4 05/28/2018 1651   MCH 29.6 05/28/2018 1651   MCHC 33.1 05/28/2018 1651   RDW 13.2 05/28/2018 1651   LYMPHSABS 1.3 06/16/2014 0342   MONOABS 0.3 06/16/2014  0342   EOSABS 0.1 06/16/2014 0342   BASOSABS 0.0 06/16/2014 0342   Depression screen PHQ 2/9 10/17/2018  Decreased Interest 1  Down, Depressed, Hopeless 1  PHQ - 2 Score 2  Altered sleeping 0  Tired, decreased energy 3  Change in appetite 3  Feeling bad or failure about yourself  0  Trouble concentrating 0  Moving slowly or fidgety/restless 0  Suicidal thoughts 0  PHQ-9 Score 8   GAD 7 : Generalized Anxiety Score 10/17/2018  Nervous, Anxious, on Edge 0  Control/stop worrying 0  Worry too much - different things 0  Trouble relaxing 0  Restless 0  Easily annoyed or irritable 1  Afraid - awful might happen 0  Total GAD 7 Score 1   Results for orders placed or performed in visit on 10/17/18  POCT glucose (manual entry)  Result Value Ref Range   POC Glucose 153 (A) 70 - 99 mg/dl  POCT glycosylated hemoglobin (Hb A1C)  Result Value Ref Range   Hemoglobin A1C     HbA1c POC (<> result, manual entry)     HbA1c, POC (prediabetic range)     HbA1c, POC (controlled diabetic range) 6.9 0.0 - 7.0 %     ASSESSMENT AND PLAN: 1. Anxiety and depression -Doing fairly well on current medications.  Refills given.  Encouraged her to continue follow-up with her therapist. - ARIPiprazole (ABILIFY) 2 MG tablet; Take 1 tablet (2 mg total) by mouth daily. For mood  Dispense: 30 tablet; Refill: 3 - escitalopram (LEXAPRO) 20 MG tablet; Take 1 tablet (20 mg total) by mouth daily. For mood  Dispense: 30 tablet; Refill: 6 - hydrOXYzine (ATARAX/VISTARIL) 25 MG tablet; Take 1 tablet (25 mg total) by  mouth 3 (three) times daily as needed for anxiety.  Dispense: 60 tablet; Refill: 6 - traZODone (DESYREL) 50 MG tablet; Take 1 tablet (50 mg total) by mouth at bedtime as needed for sleep.  Dispense: 30 tablet; Refill: 6  2. Controlled type 2 diabetes mellitus without complication, without long-term current use of insulin ([place]) At goal. Prescription given for diabetic testing supplies. Continue metformin.  Dietary counseling given.  Encouraged her to cut back on the amount that she is eating.  Try to snack on foods rather than junk foods.  Cut out sugary drinks.  Cut back on white carbohydrates. Discussed the importance of regular exercise to help not only with diabetes control at weight loss.  Encouraged her to get in about 150 minutes of moderate intensity exercise per week - POCT glucose (manual entry) - POCT glycosylated hemoglobin (Hb A1C) - Microalbumin / creatinine urine ratio - Ambulatory referral to Ophthalmology - Lipid panel - Hepatic Function Panel - Blood Glucose Monitoring Suppl (ACCU-CHEK GUIDE ME) w/Device KIT; 1 kit by Does not apply route daily.  Dispense: 1 kit; Refill: 0 - glucose blood (ACCU-CHEK GUIDE) test strip; Use as instructed  Dispense: 100 each; Refill: 12 - Lancets Misc. (ACCU-CHEK FASTCLIX LANCET) KIT; Use as directed  Dispense: 1 kit; Refill: 0 - Accu-Chek FastClix Lancets MISC; Use as directed  Dispense: 100 each; Refill: 1 - metFORMIN (GLUCOPHAGE) 500 MG tablet; Take 1 tablet (500 mg total) by mouth daily with breakfast. For blood sugar  Dispense: 30 tablet; Refill: 6  3. Class 3 severe obesity due to excess calories without serious comorbidity with body mass index (BMI) of 45.0 to 49.9 in adult [facility]) See #2 above.  Advised patient that Abilify can cause weight gain and she may want to speak with the psychiatrist about other alternatives  4. Hyperlipidemia associated with type 2 diabetes mellitus ([place]) Went over lipid profile that was done in January.  LDL not at goal.  She is fasting today.  We plan to repeat.  Advised that Abilify can also adversely affect the lipid profile - Lipid panel - Hepatic Function Panel  5. Pre-ulcerative corn or callous - Ambulatory referral to Podiatry  6. Essential hypertension We will continue to observe off medication.  Goal is to keep the blood pressure 130/80 or lower.  DASH diet discussed and encouraged  Patient was  given the opportunity to ask questions.  Patient verbalized understanding of the plan and was able to repeat key elements of the plan.   Orders Placed This Encounter  Procedures  . Microalbumin / creatinine urine ratio  . Lipid panel  . Hepatic Function Panel  . Ambulatory referral to Ophthalmology  . Ambulatory referral to Podiatry  . POCT glucose (manual entry)  . POCT glycosylated hemoglobin (Hb A1C)     Requested Prescriptions   Signed Prescriptions Disp Refills  . Blood Glucose Monitoring Suppl (ACCU-CHEK GUIDE ME) w/Device KIT 1 kit 0    Sig: 1 kit by Does not apply route daily.  Marland Kitchen glucose blood (ACCU-CHEK GUIDE) test strip 100 each 12    Sig: Use as instructed  . Lancets Misc. (ACCU-CHEK FASTCLIX LANCET) KIT 1 kit 0    Sig: Use as directed  . Accu-Chek FastClix Lancets MISC 100 each 1    Sig: Use as directed  . ARIPiprazole (ABILIFY) 2 MG tablet 30 tablet 3    Sig: Take 1 tablet (2 mg total) by mouth daily. For mood  . escitalopram (LEXAPRO) 20 MG tablet 30  tablet 6    Sig: Take 1 tablet (20 mg total) by mouth daily. For mood  . hydrOXYzine (ATARAX/VISTARIL) 25 MG tablet 60 tablet 6    Sig: Take 1 tablet (25 mg total) by mouth 3 (three) times daily as needed for anxiety.  . traZODone (DESYREL) 50 MG tablet 30 tablet 6    Sig: Take 1 tablet (50 mg total) by mouth at bedtime as needed for sleep.  . metFORMIN (GLUCOPHAGE) 500 MG tablet 30 tablet 6    Sig: Take 1 tablet (500 mg total) by mouth daily with breakfast. For blood sugar    Return in about 2 months (around 12/17/2018).  Karle Plumber, MD, FACP

## 2018-10-18 LAB — HEPATIC FUNCTION PANEL
ALT: 26 IU/L (ref 0–32)
AST: 27 IU/L (ref 0–40)
Albumin: 4.5 g/dL (ref 3.8–4.8)
Alkaline Phosphatase: 53 IU/L (ref 39–117)
Bilirubin Total: 0.5 mg/dL (ref 0.0–1.2)
Bilirubin, Direct: 0.14 mg/dL (ref 0.00–0.40)
Total Protein: 6.7 g/dL (ref 6.0–8.5)

## 2018-10-18 LAB — LIPID PANEL
Chol/HDL Ratio: 2.9 ratio (ref 0.0–4.4)
Cholesterol, Total: 201 mg/dL — ABNORMAL HIGH (ref 100–199)
HDL: 70 mg/dL (ref >39)
LDL Calculated: 108 mg/dL — ABNORMAL HIGH (ref 0–99)
Triglycerides: 115 mg/dL (ref 0–149)
VLDL Cholesterol Cal: 23 mg/dL (ref 5–40)

## 2018-10-18 LAB — MICROALBUMIN / CREATININE URINE RATIO
Creatinine, Urine: 161.3 mg/dL
Microalb/Creat Ratio: 7 mg/g creat (ref 0–29)
Microalbumin, Urine: 10.7 ug/mL

## 2018-10-23 ENCOUNTER — Telehealth: Payer: Self-pay | Admitting: Internal Medicine

## 2018-10-23 DIAGNOSIS — F419 Anxiety disorder, unspecified: Secondary | ICD-10-CM

## 2018-10-23 DIAGNOSIS — F32A Depression, unspecified: Secondary | ICD-10-CM

## 2018-10-23 DIAGNOSIS — F329 Major depressive disorder, single episode, unspecified: Secondary | ICD-10-CM

## 2018-10-23 MED ORDER — TRAZODONE HCL 100 MG PO TABS: 100.0000 mg | ORAL_TABLET | Freq: Every evening | ORAL | 3 refills | Status: DC | PRN

## 2018-10-23 MED ORDER — ARIPIPRAZOLE 5 MG PO TABS: 5.0000 mg | ORAL_TABLET | Freq: Every day | ORAL | 3 refills | Status: DC

## 2018-10-23 NOTE — Telephone Encounter (Signed)
Will forward to pcp

## 2018-10-23 NOTE — Telephone Encounter (Signed)
Patient called stating that the following medications were called in wrong:  traZODone (DESYREL) 50 MG tablet-  Patient states she takes 100mg    ARIPiprazole (ABILIFY) 2 MG tablet-  Patient states she takes 5mg   Patient stated that [facility] switched her medications. Please f/u

## 2018-10-24 NOTE — Telephone Encounter (Signed)
Pt.notified

## 2018-10-24 NOTE — Telephone Encounter (Signed)
Can you contact pt and make aware

## 2018-11-02 DIAGNOSIS — M7989 Other specified soft tissue disorders: Secondary | ICD-10-CM | POA: Diagnosis not present

## 2018-11-02 DIAGNOSIS — M2042 Other hammer toe(s) (acquired), left foot: Secondary | ICD-10-CM | POA: Diagnosis not present

## 2018-11-02 DIAGNOSIS — B351 Tinea unguium: Secondary | ICD-10-CM | POA: Diagnosis not present

## 2018-11-02 DIAGNOSIS — L600 Ingrowing nail: Secondary | ICD-10-CM | POA: Diagnosis not present

## 2018-11-02 DIAGNOSIS — M2041 Other hammer toe(s) (acquired), right foot: Secondary | ICD-10-CM | POA: Diagnosis not present

## 2018-11-09 DIAGNOSIS — E119 Type 2 diabetes mellitus without complications: Secondary | ICD-10-CM | POA: Diagnosis not present

## 2018-11-10 ENCOUNTER — Other Ambulatory Visit: Payer: Self-pay

## 2018-11-10 NOTE — Telephone Encounter (Signed)
I was notified by Raynelle Fanning at the front desk that the patient was calling about her abilify needing a PA. I have submitted the PA and it was approved with medicaid thru 11/10/19. I will notify the pharmacy so they can fill the RX.

## 2018-11-27 DIAGNOSIS — H5213 Myopia, bilateral: Secondary | ICD-10-CM | POA: Diagnosis not present

## 2018-11-28 DIAGNOSIS — H5213 Myopia, bilateral: Secondary | ICD-10-CM | POA: Diagnosis not present

## 2018-11-30 DIAGNOSIS — B351 Tinea unguium: Secondary | ICD-10-CM | POA: Diagnosis not present

## 2018-11-30 DIAGNOSIS — L600 Ingrowing nail: Secondary | ICD-10-CM | POA: Diagnosis not present

## 2018-12-19 ENCOUNTER — Ambulatory Visit: Payer: Medicaid Other | Admitting: Internal Medicine

## 2018-12-26 ENCOUNTER — Other Ambulatory Visit: Payer: Self-pay

## 2018-12-26 ENCOUNTER — Telehealth: Payer: Self-pay | Admitting: Internal Medicine

## 2018-12-26 DIAGNOSIS — E119 Type 2 diabetes mellitus without complications: Secondary | ICD-10-CM

## 2018-12-26 MED ORDER — METFORMIN HCL 500 MG PO TABS: 500.0000 mg | ORAL_TABLET | Freq: Every day | ORAL | 6 refills | Status: DC

## 2018-12-26 NOTE — Telephone Encounter (Signed)
1) Medication(s) Requested (by name): Metformin  2) Pharmacy of Choice: [email or web address] 3) Special Requests:   Approved medications will be sent to the pharmacy, we will reach out if there is an issue.  Requests made after 3pm may not be addressed until the following business day!  If a patient is unsure of the name of the medication(s) please note and ask patient to call back when they are able to provide all info, do not send to responsible party until all information is available!

## 2018-12-26 NOTE — Telephone Encounter (Signed)
Resent rx to [facility]

## 2018-12-28 DIAGNOSIS — B351 Tinea unguium: Secondary | ICD-10-CM | POA: Diagnosis not present

## 2018-12-28 DIAGNOSIS — L600 Ingrowing nail: Secondary | ICD-10-CM | POA: Diagnosis not present

## 2018-12-28 DIAGNOSIS — H5213 Myopia, bilateral: Secondary | ICD-10-CM | POA: Diagnosis not present

## 2019-02-12 ENCOUNTER — Encounter: Payer: Self-pay | Admitting: Internal Medicine

## 2019-02-12 ENCOUNTER — Other Ambulatory Visit: Payer: Self-pay

## 2019-02-12 ENCOUNTER — Ambulatory Visit: Payer: Medicaid Other | Attending: Internal Medicine | Admitting: Internal Medicine

## 2019-02-12 VITALS — BP 110/82 | HR 74 | Temp 98.5°F | Resp 18 | Ht 62.0 in | Wt 248.0 lb

## 2019-02-12 DIAGNOSIS — Z7984 Long term (current) use of oral hypoglycemic drugs: Secondary | ICD-10-CM | POA: Insufficient documentation

## 2019-02-12 DIAGNOSIS — Z760 Encounter for issue of repeat prescription: Secondary | ICD-10-CM | POA: Insufficient documentation

## 2019-02-12 DIAGNOSIS — R030 Elevated blood-pressure reading, without diagnosis of hypertension: Secondary | ICD-10-CM | POA: Diagnosis not present

## 2019-02-12 DIAGNOSIS — Z2821 Immunization not carried out because of patient refusal: Secondary | ICD-10-CM

## 2019-02-12 DIAGNOSIS — F419 Anxiety disorder, unspecified: Secondary | ICD-10-CM | POA: Diagnosis not present

## 2019-02-12 DIAGNOSIS — R0683 Snoring: Secondary | ICD-10-CM | POA: Diagnosis not present

## 2019-02-12 DIAGNOSIS — R400 Somnolence: Secondary | ICD-10-CM | POA: Diagnosis not present

## 2019-02-12 DIAGNOSIS — Z6841 Body Mass Index (BMI) 40.0 and over, adult: Secondary | ICD-10-CM

## 2019-02-12 DIAGNOSIS — F329 Major depressive disorder, single episode, unspecified: Secondary | ICD-10-CM

## 2019-02-12 DIAGNOSIS — L600 Ingrowing nail: Secondary | ICD-10-CM | POA: Diagnosis not present

## 2019-02-12 DIAGNOSIS — E785 Hyperlipidemia, unspecified: Secondary | ICD-10-CM

## 2019-02-12 DIAGNOSIS — E1169 Type 2 diabetes mellitus with other specified complication: Secondary | ICD-10-CM | POA: Diagnosis not present

## 2019-02-12 DIAGNOSIS — Z23 Encounter for immunization: Secondary | ICD-10-CM | POA: Diagnosis not present

## 2019-02-12 DIAGNOSIS — B351 Tinea unguium: Secondary | ICD-10-CM | POA: Diagnosis not present

## 2019-02-12 DIAGNOSIS — E119 Type 2 diabetes mellitus without complications: Secondary | ICD-10-CM | POA: Insufficient documentation

## 2019-02-12 DIAGNOSIS — F332 Major depressive disorder, recurrent severe without psychotic features: Secondary | ICD-10-CM | POA: Insufficient documentation

## 2019-02-12 DIAGNOSIS — F32A Depression, unspecified: Secondary | ICD-10-CM

## 2019-02-12 DIAGNOSIS — Z79899 Other long term (current) drug therapy: Secondary | ICD-10-CM | POA: Diagnosis not present

## 2019-02-12 LAB — POCT GLYCOSYLATED HEMOGLOBIN (HGB A1C): Hemoglobin A1C: 6.9 % — AB (ref 4.0–5.6)

## 2019-02-12 LAB — GLUCOSE, POCT (MANUAL RESULT ENTRY): POC Glucose: 173 mg/dl — AB (ref 70–99)

## 2019-02-12 MED ORDER — METFORMIN HCL 500 MG PO TABS: 500.0000 mg | ORAL_TABLET | Freq: Every day | ORAL | 3 refills | Status: DC

## 2019-02-12 NOTE — Progress Notes (Signed)
Patient ID: Natalie Kim, female    DOB: [birth year]  MRN: [id number]  CC: Medication Refill   Subjective: Natalie Kim is a 48 y.o. female who presents for chronic ds management Her concerns today include:  pt with hx of major dep/anx, DM, HL, obesity, elev BP  DIABETES TYPE 2/Obesity Last A1C:   Results for orders placed or performed in visit on 02/12/19  HgB A1c  Result Value Ref Range   Hemoglobin A1C 6.9 (A) 4.0 - 5.6 %   HbA1c POC (<> result, manual entry)     HbA1c, POC (prediabetic range)     HbA1c, POC (controlled diabetic range)    Glucose (CBG)  Result Value Ref Range   POC Glucose 173 (A) 70 - 99 mg/dl   A!C6.9 Med Adherence:  _0  Yes - but out of Metformin over 1 mth.  Needs RF   _1  No Medication side effects:  _2  Yes    _3  No Home Monitoring?  _4  Yes, 3 x a wk in mornings   _5  No Home glucose results range: 150-159 Diet Adherence: _6  Yes - eating veggies and fruits. "I don't know why I gain wgh.  I don't eat a lot.  I have never been this heavy before."  Walks daily with her grand-daughter.  Exercise: _7  Yes    _8  No Hypoglycemic episodes?: _9  Yes    _10  No Numbness of the feet? _11  Yes    _12  No Retinopathy hx? _13  Yes    _14  No Last eye exam: 11/09/2018 Comments:   C/O feeling overly tired even with sleeping for 7-9 hrs.  Un-fresh sleep.  Endorses snoring.  Fall asleep easily during the day  HL: LDL was 108.  She has been trying to eat better.  She is staying active.  Anx/dep:  Still seeing counselor. Feels stable on meds  Patient Active Problem List   Diagnosis Date Noted  . Anxiety and depression 10/17/2018  . Controlled type 2 diabetes mellitus without complication, without long-term current use of insulin ([place]) 10/17/2018  . Class 3 severe obesity due to excess calories without serious comorbidity with body mass index (BMI) of 45.0 to 49.9 in adult [facility]) 10/17/2018  . Hyperlipidemia associated with type 2 diabetes mellitus ([place]) 10/17/2018   . Pre-ulcerative corn or callous 10/17/2018  . Essential hypertension 10/17/2018  . Severe recurrent major depression without psychotic features ([place]) 05/28/2018     Current Outpatient Medications on File Prior to Visit  Medication Sig Dispense Refill  . Accu-Chek FastClix Lancets MISC Use as directed 100 each 1  . ARIPiprazole (ABILIFY) 5 MG tablet Take 1 tablet (5 mg total) by mouth daily. For mood 30 tablet 3  . Blood Glucose Monitoring Suppl (ACCU-CHEK GUIDE ME) w/Device KIT 1 kit by Does not apply route daily. 1 kit 0  . escitalopram (LEXAPRO) 20 MG tablet Take 1 tablet (20 mg total) by mouth daily. For mood 30 tablet 6  . glucose blood (ACCU-CHEK GUIDE) test strip Use as instructed 100 each 12  . hydrOXYzine (ATARAX/VISTARIL) 25 MG tablet Take 1 tablet (25 mg total) by mouth 3 (three) times daily as needed for anxiety. 60 tablet 6  . Lancets Misc. (ACCU-CHEK FASTCLIX LANCET) KIT Use as directed 1 kit 0  . montelukast (SINGULAIR) 10 MG tablet Take 10 mg by mouth daily as needed (congestion).    . traZODone (DESYREL) 100 MG tablet Take 1 tablet (100 mg total) by mouth at bedtime as needed for sleep. [place]  tablet 3   No current facility-administered medications on file prior to visit.     Allergies  Allergen Reactions  . Latex Itching and Swelling    SWELLING REACTION UNSPECIFIED   . Augmentin [Amoxicillin-Pot Clavulanate]     Sick and nauseous  . Other Other (See Comments)    Pt reports allergy to an antibiotic in the penicillin family, unsure of actual medication and reaction.  It was when she was a child    Social History   Socioeconomic History  . Marital status: Married    Spouse name: Not on file  . Number of children: 2  . Years of education: some college  . Highest education level: Not on file  Occupational History  . Occupation: unemployed  Social Needs  . Financial resource strain: Not on file  . Food insecurity    Worry: Not on file    Inability: Not on file   . Transportation needs    Medical: Not on file    Non-medical: Not on file  Tobacco Use  . Smoking status: Never Smoker  . Smokeless tobacco: Never Used  Substance and Sexual Activity  . Alcohol use: No  . Drug use: No  . Sexual activity: Yes    Partners: Male    Birth control/protection: Other-see comments    Comment: reports H had vasectomy  Lifestyle  . Physical activity    Days per week: Not on file    Minutes per session: Not on file  . Stress: Not on file  Relationships  . Social Herbalist on phone: Not on file    Gets together: Not on file    Attends religious service: Not on file    Active member of club or organization: Not on file    Attends meetings of clubs or organizations: Not on file    Relationship status: Not on file  . Intimate partner violence    Fear of current or ex partner: Not on file    Emotionally abused: Not on file    Physically abused: Not on file    Forced sexual activity: Not on file  Other Topics Concern  . Not on file  Social History Narrative  . Not on file    Family History  Problem Relation Age of Onset  . Hypertension Mother   . Heart murmur Mother   . High Cholesterol Mother   . Lung cancer Mother   . Prostate cancer Father   . Cancer Maternal Grandmother   . Cancer Maternal Grandfather     Past Surgical History:  Procedure Laterality Date  . CESAREAN SECTION N/A 01/31/2013   Procedure: PRIMARY CESAREAN SECTION;  Surgeon: Shelly Bombard, MD;  Location: [facility];  Service: Obstetrics;  Laterality: N/A;  . CHOLECYSTECTOMY N/A 03/18/2016   Procedure: LAPAROSCOPIC CHOLECYSTECTOMY WITH INTRAOPERATIVE CHOLANGIOGRAM;  Surgeon: Autumn Messing III, MD;  Location: [facility];  Service: General;  Laterality: N/A;  . MOUTH SURGERY      ROS: Review of Systems Negative except as stated above  PHYSICAL EXAM: BP 110/82   Pulse 74   Temp 98.5 F (36.9 C) (Oral)   Resp 18   Ht _0  (1.575 m)   Wt 248 lb (112.5 kg)   LMP  01/24/2019   SpO2 96%   BMI 45.36 kg/m   Wt Readings from Last 3 Encounters:  02/12/19 248 lb (112.5 kg)  10/17/18 247 lb 9.6 oz (112.3 kg)  03/18/16 222 lb (100.7 kg)  Physical Exam  General appearance - alert, well appearing, and middle-age Caucasian female in no distress Mental status - normal mood, behavior, speech, dress, motor activity, and thought processes Mouth - mucous membranes moist, pharynx normal without lesions Neck - supple, no significant adenopathy Chest - clear to auscultation, no wheezes, rales or rhonchi, symmetric air entry Heart - normal rate, regular rhythm, normal S1, S2, no murmurs, rubs, clicks or gallops Extremities - peripheral pulses normal, no pedal edema, no clubbing or cyanosis Diabetic Foot Exam - Simple   Simple Foot Form Visual Inspection See comments: Yes Sensation Testing Intact to touch and monofilament testing bilaterally: Yes Pulse Check Posterior Tibialis and Dorsalis pulse intact bilaterally: Yes Comments Hard somewhat of ulcerative calluses around both heels      CMP Latest Ref Rng & Units 10/17/2018 05/28/2018 07/30/2017  Glucose 70 - 99 mg/dL - 127(H) 148(H)  BUN 6 - 20 mg/dL - 15 9  Creatinine 0.44 - 1.00 mg/dL - 0.77 0.84  Sodium 135 - 145 mmol/L - 141 131(L)  Potassium 3.5 - 5.1 mmol/L - 4.0 3.6  Chloride 98 - 111 mmol/L - 106 99(L)  CO2 22 - 32 mmol/L - 27 19(L)  Calcium 8.9 - 10.3 mg/dL - 9.6 8.7(L)  Total Protein 6.0 - 8.5 g/dL 6.7 7.6 7.5  Total Bilirubin 0.0 - 1.2 mg/dL 0.5 1.3(H) 1.0  Alkaline Phos 39 - 117 IU/L 53 51 55  AST 0 - 40 IU/L 27 27 38  ALT 0 - 32 IU/L _0 Lipid Panel     Component Value Date/Time   CHOL 201 (H) 10/17/2018 1033   TRIG 115 10/17/2018 1033   HDL 70 10/17/2018 1033   CHOLHDL 2.9 10/17/2018 1033   CHOLHDL 3.6 05/29/2018 0640   VLDL 34 05/29/2018 0640   LDLCALC 108 (H) 10/17/2018 1033    CBC    Component Value Date/Time   WBC 7.7 05/28/2018 1651   RBC 5.30 (H) 05/28/2018  1651   HGB 15.7 (H) 05/28/2018 1651   HCT 47.4 (H) 05/28/2018 1651   PLT 214 05/28/2018 1651   MCV 89.4 05/28/2018 1651   MCH 29.6 05/28/2018 1651   MCHC 33.1 05/28/2018 1651   RDW 13.2 05/28/2018 1651   LYMPHSABS 1.3 06/16/2014 0342   MONOABS 0.3 06/16/2014 0342   EOSABS 0.1 06/16/2014 0342   BASOSABS 0.0 06/16/2014 0342    ASSESSMENT AND PLAN:  1. Controlled type 2 diabetes mellitus without complication, without long-term current use of insulin (HCC) A1c at goal.  Continue to encourage healthy eating habits and regular exercise.  Continue metformin - HgB A1c - Glucose (CBG) - metFORMIN (GLUCOPHAGE) 500 MG tablet; Take 1 tablet (500 mg total) by mouth daily with breakfast. For blood sugar  Dispense: 90 tablet; Refill: 3  2. Class 3 severe obesity due to excess calories without serious comorbidity with body mass index (BMI) of 45.0 to 49.9 in adult [facility]) Discussed referral to medical weight management to help her in achieving her weight loss goals - Amb Ref to Medical Weight Management  3. Hyperlipidemia associated with type 2 diabetes mellitus ([place]) Recheck lipid profile.  If still above goal we will start Lipitor - Lipid panel  4. Elevated blood-pressure reading without diagnosis of hypertension Blood pressure is improved.  Continue DASH diet and observe off medication  5. Loud snoring History suggests obstructive sleep apnea.  She is agreeable to doing a home sleep study - Home sleep test  6. Daytime sleepiness -  Home sleep test  7. Anxiety and depression Stable on current medications  8. Need for influenza vaccination Given  9. 23-polyvalent pneumococcal polysaccharide vaccine declined Given     Patient was given the opportunity to ask questions.  Patient verbalized understanding of the plan and was able to repeat key elements of the plan.   Orders Placed This Encounter  Procedures  . Pneumococcal polysaccharide vaccine 23-valent greater than or equal to 2yo  subcutaneous/IM  . Lipid panel  . Amb Ref to Medical Weight Management  . HgB A1c  . Glucose (CBG)  . Home sleep test     Requested Prescriptions   Signed Prescriptions Disp Refills  . metFORMIN (GLUCOPHAGE) 500 MG tablet 90 tablet 3    Sig: Take 1 tablet (500 mg total) by mouth daily with breakfast. For blood sugar    Return in about 3 months (around 05/14/2019).  Karle Plumber, MD, FACP

## 2019-02-12 NOTE — Patient Instructions (Signed)
You have been referred to medical weight loss management.  You have been referred for home sleep study.

## 2019-02-13 LAB — LIPID PANEL
Chol/HDL Ratio: 3.2 ratio (ref 0.0–4.4)
Cholesterol, Total: 225 mg/dL — ABNORMAL HIGH (ref 100–199)
HDL: 70 mg/dL (ref >39)
LDL Chol Calc (NIH): 120 mg/dL — ABNORMAL HIGH (ref 0–99)
Triglycerides: 203 mg/dL — ABNORMAL HIGH (ref 0–149)
VLDL Cholesterol Cal: 35 mg/dL (ref 5–40)

## 2019-02-14 ENCOUNTER — Other Ambulatory Visit: Payer: Self-pay | Admitting: Internal Medicine

## 2019-02-14 MED ORDER — ATORVASTATIN CALCIUM 20 MG PO TABS: 20.0000 mg | ORAL_TABLET | Freq: Every day | ORAL | 3 refills | Status: DC

## 2019-02-26 ENCOUNTER — Telehealth: Payer: Self-pay | Admitting: Internal Medicine

## 2019-02-26 MED ORDER — LOVASTATIN 10 MG PO TABS: 10.0000 mg | ORAL_TABLET | Freq: Every day | ORAL | 4 refills | Status: DC

## 2019-02-26 NOTE — Telephone Encounter (Signed)
Will forward to pcp

## 2019-02-26 NOTE — Telephone Encounter (Signed)
Pt called to request a change in medication, since she began taking  -atorvastatin (LIPITOR) 20 MG tablet on 02/14/2019 She continues to feel sick, she reports having nausea, diarrhea, Vomiting, dizziness. She would like an alternative be sent to -[place], [place] [facility] [place]. Please follow up

## 2019-02-28 NOTE — Telephone Encounter (Signed)
Contacted pt to go over Dr. Wynetta Emery response pt didn't answer left a detailed vm informing pt of medication change and if she has any questions or concerns to give me a call

## 2019-03-06 ENCOUNTER — Other Ambulatory Visit: Payer: Self-pay | Admitting: Internal Medicine

## 2019-03-06 DIAGNOSIS — F329 Major depressive disorder, single episode, unspecified: Secondary | ICD-10-CM

## 2019-03-06 DIAGNOSIS — F32A Depression, unspecified: Secondary | ICD-10-CM

## 2019-03-06 DIAGNOSIS — F419 Anxiety disorder, unspecified: Secondary | ICD-10-CM

## 2019-04-07 ENCOUNTER — Other Ambulatory Visit: Payer: Self-pay | Admitting: Internal Medicine

## 2019-04-07 DIAGNOSIS — F419 Anxiety disorder, unspecified: Secondary | ICD-10-CM

## 2019-04-07 DIAGNOSIS — F32A Depression, unspecified: Secondary | ICD-10-CM

## 2019-04-07 DIAGNOSIS — F329 Major depressive disorder, single episode, unspecified: Secondary | ICD-10-CM

## 2019-05-21 ENCOUNTER — Ambulatory Visit: Payer: Medicaid Other | Admitting: Internal Medicine

## 2019-05-31 ENCOUNTER — Encounter (HOSPITAL_BASED_OUTPATIENT_CLINIC_OR_DEPARTMENT_OTHER): Payer: Medicaid Other | Admitting: Internal Medicine

## 2019-06-01 ENCOUNTER — Ambulatory Visit: Payer: Medicaid Other | Attending: Internal Medicine | Admitting: Internal Medicine

## 2019-06-01 ENCOUNTER — Other Ambulatory Visit: Payer: Self-pay

## 2019-06-01 DIAGNOSIS — E785 Hyperlipidemia, unspecified: Secondary | ICD-10-CM

## 2019-06-01 DIAGNOSIS — F32A Depression, unspecified: Secondary | ICD-10-CM

## 2019-06-01 DIAGNOSIS — F419 Anxiety disorder, unspecified: Secondary | ICD-10-CM

## 2019-06-01 DIAGNOSIS — E119 Type 2 diabetes mellitus without complications: Secondary | ICD-10-CM

## 2019-06-01 DIAGNOSIS — Z6841 Body Mass Index (BMI) 40.0 and over, adult: Secondary | ICD-10-CM | POA: Diagnosis not present

## 2019-06-01 DIAGNOSIS — F329 Major depressive disorder, single episode, unspecified: Secondary | ICD-10-CM

## 2019-06-01 DIAGNOSIS — E1169 Type 2 diabetes mellitus with other specified complication: Secondary | ICD-10-CM

## 2019-06-01 MED ORDER — LOVASTATIN 10 MG PO TABS: 10.0000 mg | ORAL_TABLET | Freq: Every day | ORAL | 4 refills | Status: AC

## 2019-06-01 MED ORDER — TRAZODONE HCL 100 MG PO TABS: ORAL_TABLET | ORAL | 5 refills | Status: AC

## 2019-06-01 MED ORDER — HYDROXYZINE HCL 25 MG PO TABS: 25.0000 mg | ORAL_TABLET | Freq: Three times a day (TID) | ORAL | 6 refills | Status: AC | PRN

## 2019-06-01 MED ORDER — ESCITALOPRAM OXALATE 20 MG PO TABS: 20.0000 mg | ORAL_TABLET | Freq: Every day | ORAL | 6 refills | Status: AC

## 2019-06-01 MED ORDER — ARIPIPRAZOLE 5 MG PO TABS: ORAL_TABLET | ORAL | 5 refills | Status: DC

## 2019-06-01 MED ORDER — METFORMIN HCL 500 MG PO TABS: 500.0000 mg | ORAL_TABLET | Freq: Every day | ORAL | 3 refills | Status: AC

## 2019-06-01 NOTE — Progress Notes (Signed)
Virtual Visit via Telephone Note Due to current restrictions/limitations of in-office visits due to the COVID-19 pandemic, this scheduled clinical appointment was converted to a telehealth visit  I connected with Natalie Kim on 06/01/19 at 5:10 p.m by telephone and verified that I am speaking with the correct person using two identifiers. I am in my office.  The patient is at home.  Only the patient and myself participated in this encounter.  I discussed the limitations, risks, security and privacy concerns of performing an evaluation and management service by telephone and the availability of in person appointments. I also discussed with the patient that there may be a patient responsible charge related to this service. The patient expressed understanding and agreed to proceed.   History of Present Illness: pt with hx of major dep/anx, DM, HL, obesity, elev BP.  Purpose of today's visit is chronic disease management.  DM:  Not checking BS Taking Metformin as prescribed Doing better with eating habits.  More meal preps and eating more veggies and fruits Joined the gym at the being of this yr and going 5 days a wk  HL:  Taking and tolerating Mevacor   Major dep/Anx: feels stable and control on current meds. No longer seeing counselor as off December.  Too difficult to coordinate her work schedule with time of appointments.   Needing refills on her psychiatric medications including trazodone, Lexapro and Abilify  Current Outpatient Medications on File Prior to Visit  Medication Sig Dispense Refill  . ARIPiprazole (ABILIFY) 5 MG tablet TAKE 1 TABLET(5 MG) BY MOUTH DAILY FOR MOOD 30 tablet 2  . escitalopram (LEXAPRO) 20 MG tablet Take 1 tablet (20 mg total) by mouth daily. For mood 30 tablet 6  . hydrOXYzine (ATARAX/VISTARIL) 25 MG tablet Take 1 tablet (25 mg total) by mouth 3 (three) times daily as needed for anxiety. 60 tablet 6  . lovastatin (MEVACOR) 10 MG tablet Take 1 tablet (10 mg  total) by mouth at bedtime. 30 tablet 4  . metFORMIN (GLUCOPHAGE) 500 MG tablet Take 1 tablet (500 mg total) by mouth daily with breakfast. For blood sugar 90 tablet 3  . traZODone (DESYREL) 100 MG tablet TAKE 1 TABLET(100 MG) BY MOUTH AT BEDTIME AS NEEDED FOR SLEEP 30 tablet 3  . Accu-Chek FastClix Lancets MISC Use as directed 100 each 1  . Blood Glucose Monitoring Suppl (ACCU-CHEK GUIDE ME) w/Device KIT 1 kit by Does not apply route daily. 1 kit 0  . glucose blood (ACCU-CHEK GUIDE) test strip Use as instructed 100 each 12  . Lancets Misc. (ACCU-CHEK FASTCLIX LANCET) KIT Use as directed 1 kit 0  . montelukast (SINGULAIR) 10 MG tablet Take 10 mg by mouth daily as needed (congestion).     No current facility-administered medications on file prior to visit.    Observations/Objective: Results for orders placed or performed in visit on 02/12/19  Lipid panel  Result Value Ref Range   Cholesterol, Total 225 (H) 100 - 199 mg/dL   Triglycerides 203 (H) 0 - 149 mg/dL   HDL 70 >39 mg/dL   VLDL Cholesterol Cal 35 5 - 40 mg/dL   LDL Chol Calc (NIH) 120 (H) 0 - 99 mg/dL   Chol/HDL Ratio 3.2 0.0 - 4.4 ratio  HgB A1c  Result Value Ref Range   Hemoglobin A1C 6.9 (A) 4.0 - 5.6 %   HbA1c POC (<> result, manual entry)     HbA1c, POC (prediabetic range)     HbA1c, POC (controlled  diabetic range)    Glucose (CBG)  Result Value Ref Range   POC Glucose 173 (A) 70 - 99 mg/dl     Assessment and Plan: 1. Controlled type 2 diabetes mellitus without complication, without long-term current use of insulin ([place]) Commended her on dietary changes.  Encouraged her to keep up the good works. Commended her on starting an exercise routine.  Encouraged to keep up the good works. - metFORMIN (GLUCOPHAGE) 500 MG tablet; Take 1 tablet (500 mg total) by mouth daily with breakfast. For blood sugar  Dispense: 90 tablet; Refill: 3 - Hemoglobin A1c; Future  2. Anxiety and depression Stable on current medications -  ARIPiprazole (ABILIFY) 5 MG tablet; TAKE 1 TABLET(5 MG) BY MOUTH DAILY FOR MOOD  Dispense: 30 tablet; Refill: 5 - escitalopram (LEXAPRO) 20 MG tablet; Take 1 tablet (20 mg total) by mouth daily. For mood  Dispense: 30 tablet; Refill: 6 - hydrOXYzine (ATARAX/VISTARIL) 25 MG tablet; Take 1 tablet (25 mg total) by mouth 3 (three) times daily as needed for anxiety.  Dispense: 60 tablet; Refill: 6 - traZODone (DESYREL) 100 MG tablet; TAKE 1 TABLET(100 MG) BY MOUTH AT BEDTIME AS NEEDED FOR SLEEP  Dispense: 30 tablet; Refill: 5  3. Hyperlipidemia associated with type 2 diabetes mellitus (HCC)  - lovastatin (MEVACOR) 10 MG tablet; Take 1 tablet (10 mg total) by mouth at bedtime.  Dispense: 30 tablet; Refill: 4  4. Class 3 severe obesity due to excess calories without serious comorbidity with body mass index (BMI) of 45.0 to 49.9 in adult [facility]) See #1 above   Follow Up Instructions: Follow-up in 3 months   I discussed the assessment and treatment plan with the patient. The patient was provided an opportunity to ask questions and all were answered. The patient agreed with the plan and demonstrated an understanding of the instructions.   The patient was advised to call back or seek an in-person evaluation if the symptoms worsen or if the condition fails to improve as anticipated.  I provided 7 minutes of non-face-to-face time during this encounter.   Karle Plumber, MD

## 2019-06-18 ENCOUNTER — Ambulatory Visit (INDEPENDENT_AMBULATORY_CARE_PROVIDER_SITE_OTHER): Payer: Medicaid Other | Admitting: Family Medicine

## 2019-06-18 ENCOUNTER — Encounter (INDEPENDENT_AMBULATORY_CARE_PROVIDER_SITE_OTHER): Payer: Self-pay | Admitting: Family Medicine

## 2019-06-18 ENCOUNTER — Other Ambulatory Visit: Payer: Self-pay

## 2019-06-18 VITALS — BP 125/86 | HR 68 | Temp 98.1°F | Ht 63.0 in | Wt 241.0 lb

## 2019-06-18 DIAGNOSIS — Z6841 Body Mass Index (BMI) 40.0 and over, adult: Secondary | ICD-10-CM | POA: Diagnosis not present

## 2019-06-18 DIAGNOSIS — E1165 Type 2 diabetes mellitus with hyperglycemia: Secondary | ICD-10-CM | POA: Diagnosis not present

## 2019-06-18 DIAGNOSIS — R0602 Shortness of breath: Secondary | ICD-10-CM | POA: Diagnosis not present

## 2019-06-18 DIAGNOSIS — Z9189 Other specified personal risk factors, not elsewhere classified: Secondary | ICD-10-CM | POA: Diagnosis not present

## 2019-06-18 DIAGNOSIS — I10 Essential (primary) hypertension: Secondary | ICD-10-CM

## 2019-06-18 DIAGNOSIS — F329 Major depressive disorder, single episode, unspecified: Secondary | ICD-10-CM

## 2019-06-18 DIAGNOSIS — R5383 Other fatigue: Secondary | ICD-10-CM

## 2019-06-18 DIAGNOSIS — F419 Anxiety disorder, unspecified: Secondary | ICD-10-CM

## 2019-06-18 DIAGNOSIS — E559 Vitamin D deficiency, unspecified: Secondary | ICD-10-CM

## 2019-06-18 DIAGNOSIS — Z0289 Encounter for other administrative examinations: Secondary | ICD-10-CM

## 2019-06-18 DIAGNOSIS — F32A Depression, unspecified: Secondary | ICD-10-CM

## 2019-06-18 LAB — EKG 12-LEAD

## 2019-06-18 NOTE — Progress Notes (Signed)
Dear Dr. Laural Benes,   Thank you for referring [name] to our clinic. The following note includes my evaluation and treatment recommendations.  Chief Complaint:   OBESITY KATRECE ROEDIGER (MR# [id number]) is a 49 y.o. female who presents for evaluation and treatment of obesity and related comorbidities. Current BMI is Body mass index is 42.69 kg/m.[name] has been struggling with her weight for many years and has been unsuccessful in either losing weight, maintaining weight loss, or reaching her healthy weight goal.  Katheryn is currently in the action stage of change and ready to dedicate time achieving and maintaining a healthier weight. Iesha is interested in becoming our patient and working on intensive lifestyle modifications including (but not limited to) diet and exercise for weight loss.  Meghann's habits were reviewed today and are as follows: Her family eats meals together, she thinks her family will eat healthier with her, her desired weight loss is 81 pounds, she started gaining weight around 2010, her heaviest weight ever was 241 pounds, she has significant food cravings issues, she is frequently drinking liquids with calories, she sometimes makes poor food choices, she sometimes eats larger portions than normal and she struggles with emotional eating.  Ruth may eat breakfast, and if she does, she has an apple cinnamon rice cake and grapes, approximately 1 1/2 cups (feels full), she may or may not have a snack which consists of a rice cake. For lunch she has chicken and veggies, approximately 2.5 ounces of chicken and 1 cup of vegetables, 1 cup of rice (feels full). Dinner consists of spaghetti and sauce with bread or chicken and vegetables, the same portions as lunch.  Depression Screen Kateryn's Food and Mood (modified PHQ-9) score was strongly.  Depression screen PHQ 2/9 06/18/2019  Decreased Interest 3  Down, Depressed, Hopeless 3  PHQ - 2 Score 6    Altered sleeping 3  Tired, decreased energy 3  Change in appetite 3  Feeling bad or failure about yourself  1  Trouble concentrating 0  Moving slowly or fidgety/restless 1  Suicidal thoughts 0  PHQ-9 Score 17  Difficult doing work/chores Not difficult at all   Subjective:   Other fatigue (new) Idalis admits to daytime somnolence and admits to waking up still tired. Patent has a history of symptoms of daytime fatigue, morning fatigue, Epworth sleepiness scale and hypertension. Leondra generally gets 4 hours of sleep per night, and states that she has generally restless sleep. Snoring is present. Apneic episodes are not present. Epworth Sleepiness Score is 13. EKG ordered today shows normal sinus rhythm at 67 BPM.  SOB (shortness of breath) on exertion (new) Myalee notes increasing shortness of breath with exercising and seems to be worsening over time with weight gain. She notes getting out of breath sooner with activity than she used to. This has not gotten worse recently. Mazzie denies shortness of breath at rest or orthopnea.  Type 2 diabetes mellitus with hyperglycemia, without long-term current use of insulin (HCC) Audray has worsening diabetes. Her last A1c was at 6.9 in September 2020. Her last eye exam was in August of 2020. Jaela had a foot exam in September or October of 2020. She is on metformin 500 mg daily and she is not on anything else. Labs were discussed with patient today.  Lab Results  Component Value Date   HGBA1C 6.9 (A) 02/12/2019   HGBA1C 6.9 10/17/2018   HGBA1C 6.5 (H) 05/29/2018   Lab Results  Component Value  Date   LDLCALC 120 (H) 02/12/2019   CREATININE 0.77 05/28/2018   No results found for: INSULIN  Essential hypertension Shemica was diagnosed approximately fifteen years ago with hypertension. She has been off medications for approximately seven years.  BP Readings from Last 3 Encounters:  06/18/19 125/86  02/12/19 110/82  10/17/18  130/80   Lab Results  Component Value Date   CREATININE 0.77 05/28/2018   CREATININE 0.84 07/30/2017   CREATININE 0.69 03/16/2016   Vitamin D deficiency  Nissa has worsening vitamin D deficiency. She was diagnosed many years ago. She is not currently taking vit D. She denies nausea, vomiting or muscle weakness.  Anxiety and depression (new) Italia has a new diagnosis of anxiety and depression. Dr. Laural Benes is prescribing her medications. She is on Abilify, Escitalopram, Trazadone and Hydroxyzine. She shows no signs of suicidal or homicidal ideations.  At risk for osteoporosis Kyndall is at higher risk of osteopenia and osteoporosis due to Vitamin D deficiency.   Assessment/Plan:   Other fatigue (new) Dustin does feel that her weight is causing her energy to be lower than it should be. Fatigue may be related to obesity, depression or many other causes. Labs and EKG will be ordered, and in the meanwhile, Nakenya will focus on self care including making healthy food choices, increasing physical activity and focusing on stress reduction.  SOB (shortness of breath) on exertion (new) Lashunda does feel that she gets out of breath more easily that she used to when she exercises. Dajon's shortness of breath appears to be obesity related and exercise induced. She has agreed to work on weight loss and gradually increase exercise to treat her exercise induced shortness of breath. Labs and indirect calorimetry will be ordered today. Will continue to monitor closely.  Type 2 diabetes mellitus with hyperglycemia, without long-term current use of insulin (HCC) Good blood sugar control is important to decrease the likelihood of diabetic complications such as nephropathy, neuropathy, limb loss, blindness, coronary artery disease, and death. Intensive lifestyle modification including diet, exercise and weight loss are the first line of treatment for diabetes. We will check Hgb A1c and insulin  level today.  Essential hypertension Eartha is working on healthy weight loss and exercise to improve blood pressure control. We will order CMP and EKG today. We will watch for signs of hypotension as she continues her lifestyle modifications.  Vitamin D deficiency Low Vitamin D level contributes to fatigue and are associated with obesity, breast, and colon cancer. We will check vitamin D level today. Suhana will follow-up for routine testing of Vitamin D, at least 2-3 times per year to avoid over-replacement.  Anxiety and depression (new) Kashawn will follow up with Dr. Laural Benes.  At risk for osteoporosis Anavictoria was given approximately 15 minutes of osteoporosis prevention counseling today. Darianny is at risk for osteopenia and osteoporosis due to her Vitamin D deficiency. She was encouraged to take her Vitamin D and follow her higher calcium diet and increase strengthening exercise to help strengthen her bones and decrease her risk of osteopenia and osteoporosis.  Repetitive spaced learning was employed today to elicit superior memory formation and behavioral change.  Class 3 severe obesity with serious comorbidity and body mass index (BMI) of 40.0 to 44.9 in adult, unspecified obesity type (HCC) Aynslee is currently in the action stage of change and her goal is to continue with weight loss efforts. I recommend Kana begin the structured treatment plan as follows:  She has agreed to the Category 3  Plan.  Exercise goals: No exercise has been prescribed at this time.   Behavioral modification strategies: increasing lean protein intake, increasing vegetables, meal planning and cooking strategies, keeping healthy foods in the home and planning for success.  She was informed of the importance of frequent follow-up visits to maximize her success with intensive lifestyle modifications for her multiple health conditions. She was informed we would discuss her lab results at her next  visit unless there is a critical issue that needs to be addressed sooner. Ameah agreed to keep her next visit at the agreed upon time to discuss these results.  Objective:   Blood pressure 125/86, pulse 68, temperature 98.1 F (36.7 C), temperature source Oral, height 5\' 3"  (1.6 m), weight 241 lb (109.3 kg), last menstrual period 03/18/2019, SpO2 96 %. Body mass index is 42.69 kg/m.  EKG: Normal sinus rhythm, rate 67 BPM.  Indirect Calorimeter completed today shows a VO2 of 302 and a REE of 2106.  Her calculated basal metabolic rate is 9371 thus her basal metabolic rate is better than expected.  General: Cooperative, alert, well developed, in no acute distress. HEENT: Conjunctivae and lids unremarkable. Cardiovascular: Regular rhythm.  Lungs: Normal work of breathing. Neurologic: No focal deficits.   Lab Results  Component Value Date   CREATININE 0.77 05/28/2018   BUN 15 05/28/2018   NA 141 05/28/2018   K 4.0 05/28/2018   CL 106 05/28/2018   CO2 27 05/28/2018   Lab Results  Component Value Date   ALT 26 10/17/2018   AST 27 10/17/2018   ALKPHOS 53 10/17/2018   BILITOT 0.5 10/17/2018   Lab Results  Component Value Date   HGBA1C 6.9 (A) 02/12/2019   HGBA1C 6.9 10/17/2018   HGBA1C 6.5 (H) 05/29/2018   No results found for: INSULIN Lab Results  Component Value Date   TSH 2.126 05/29/2018   Lab Results  Component Value Date   CHOL 225 (H) 02/12/2019   HDL 70 02/12/2019   LDLCALC 120 (H) 02/12/2019   TRIG 203 (H) 02/12/2019   CHOLHDL 3.2 02/12/2019   Lab Results  Component Value Date   WBC 7.7 05/28/2018   HGB 15.7 (H) 05/28/2018   HCT 47.4 (H) 05/28/2018   MCV 89.4 05/28/2018   PLT 214 05/28/2018   No results found for: IRON, TIBC, FERRITIN   Attestation Statements:   This is the patient's first visit at [facility]. The patient's NEW PATIENT PACKET was reviewed at length. Included in the packet: current and past health history,  medications, allergies, ROS, gynecologic history (women only), surgical history, family history, social history, weight history, weight loss surgery history (for those that have had weight loss surgery), nutritional evaluation, mood and food questionnaire, PHQ9, Epworth questionnaire, sleep habits questionnaire, patient life and health improvement goals questionnaire. These will all be scanned into the patient's chart under media.   During the visit, I independently reviewed the patient's EKG, bioimpedance scale results, and indirect calorimeter results. I used this information to tailor a meal plan for the patient that will help her to lose weight and will improve her obesity-related conditions going forward. I performed a medically necessary appropriate examination and/or evaluation. I discussed the assessment and treatment plan with the patient. The patient was provided an opportunity to ask questions and all were answered. The patient agreed with the plan and demonstrated an understanding of the instructions. Labs were ordered at this visit and will be reviewed at the next visit unless more  critical results need to be addressed immediately. Clinical information was updated and documented in the EMR.   Time spent on visit including pre-visit chart review and post-visit care was 60 minutes.   A separate 15 minutes was spent on risk counseling (see above).    I, [place] Crane, am acting as transcriptionist for Filbert Schilder, MD.  I have reviewed the above documentation for accuracy and completeness, and I agree with the above. - Debbra Riding, MD

## 2019-06-19 LAB — COMPREHENSIVE METABOLIC PANEL
ALT: 24 IU/L (ref 0–32)
AST: 32 IU/L (ref 0–40)
Albumin/Globulin Ratio: 1.6 (ref 1.2–2.2)
Albumin: 4.4 g/dL (ref 3.8–4.8)
Alkaline Phosphatase: 61 IU/L (ref 39–117)
BUN/Creatinine Ratio: 18 (ref 9–23)
BUN: 14 mg/dL (ref 6–24)
Bilirubin Total: 0.4 mg/dL (ref 0.0–1.2)
CO2: 23 mmol/L (ref 20–29)
Calcium: 9.3 mg/dL (ref 8.7–10.2)
Chloride: 104 mmol/L (ref 96–106)
Creatinine, Ser: 0.76 mg/dL (ref 0.57–1.00)
GFR calc Af Amer: 109 mL/min/{1.73_m2} (ref >59)
GFR calc non Af Amer: 94 mL/min/{1.73_m2} (ref >59)
Globulin, Total: 2.7 g/dL (ref 1.5–4.5)
Glucose: 142 mg/dL — ABNORMAL HIGH (ref 65–99)
Potassium: 4.2 mmol/L (ref 3.5–5.2)
Sodium: 140 mmol/L (ref 134–144)
Total Protein: 7.1 g/dL (ref 6.0–8.5)

## 2019-06-19 LAB — CBC WITH DIFFERENTIAL/PLATELET
Basophils Absolute: 0.1 10*3/uL (ref 0.0–0.2)
Basos: 1 %
EOS (ABSOLUTE): 0.2 10*3/uL (ref 0.0–0.4)
Eos: 3 %
Hematocrit: 45.1 % (ref 34.0–46.6)
Hemoglobin: 15.5 g/dL (ref 11.1–15.9)
Immature Grans (Abs): 0 10*3/uL (ref 0.0–0.1)
Immature Granulocytes: 1 %
Lymphocytes Absolute: 1.9 10*3/uL (ref 0.7–3.1)
Lymphs: 35 %
MCH: 30.1 pg (ref 26.6–33.0)
MCHC: 34.4 g/dL (ref 31.5–35.7)
MCV: 88 fL (ref 79–97)
Monocytes Absolute: 0.3 10*3/uL (ref 0.1–0.9)
Monocytes: 6 %
Neutrophils Absolute: 3.1 10*3/uL (ref 1.4–7.0)
Neutrophils: 54 %
Platelets: 188 10*3/uL (ref 150–450)
RBC: 5.15 x10E6/uL (ref 3.77–5.28)
RDW: 13.2 % (ref 11.7–15.4)
WBC: 5.5 10*3/uL (ref 3.4–10.8)

## 2019-06-19 LAB — HEMOGLOBIN A1C
Est. average glucose Bld gHb Est-mCnc: 157 mg/dL
Hgb A1c MFr Bld: 7.1 % — ABNORMAL HIGH (ref 4.8–5.6)

## 2019-06-19 LAB — T3: T3, Total: 139 ng/dL (ref 71–180)

## 2019-06-19 LAB — VITAMIN D 25 HYDROXY (VIT D DEFICIENCY, FRACTURES): Vit D, 25-Hydroxy: 18.8 ng/mL — ABNORMAL LOW (ref 30.0–100.0)

## 2019-06-19 LAB — FOLATE: Folate: 14.3 ng/mL (ref >3.0)

## 2019-06-19 LAB — VITAMIN B12: Vitamin B-12: 407 pg/mL (ref 232–1245)

## 2019-06-19 LAB — LIPID PANEL WITH LDL/HDL RATIO
Cholesterol, Total: 182 mg/dL (ref 100–199)
HDL: 77 mg/dL (ref >39)
LDL Chol Calc (NIH): 85 mg/dL (ref 0–99)
LDL/HDL Ratio: 1.1 ratio (ref 0.0–3.2)
Triglycerides: 118 mg/dL (ref 0–149)
VLDL Cholesterol Cal: 20 mg/dL (ref 5–40)

## 2019-06-19 LAB — TSH: TSH: 0.854 u[IU]/mL (ref 0.450–4.500)

## 2019-06-19 LAB — INSULIN, RANDOM: INSULIN: 22.7 u[IU]/mL (ref 2.6–24.9)

## 2019-06-19 LAB — T4, FREE: Free T4: 1.18 ng/dL (ref 0.82–1.77)

## 2019-07-02 ENCOUNTER — Ambulatory Visit (INDEPENDENT_AMBULATORY_CARE_PROVIDER_SITE_OTHER): Payer: Medicaid Other | Admitting: Family Medicine

## 2019-07-02 ENCOUNTER — Other Ambulatory Visit: Payer: Self-pay

## 2019-07-02 VITALS — BP 127/83 | HR 66 | Temp 98.0°F | Ht 63.0 in | Wt 236.0 lb

## 2019-07-02 DIAGNOSIS — E559 Vitamin D deficiency, unspecified: Secondary | ICD-10-CM

## 2019-07-02 DIAGNOSIS — Z6841 Body Mass Index (BMI) 40.0 and over, adult: Secondary | ICD-10-CM

## 2019-07-02 DIAGNOSIS — E1165 Type 2 diabetes mellitus with hyperglycemia: Secondary | ICD-10-CM

## 2019-07-02 DIAGNOSIS — E7849 Other hyperlipidemia: Secondary | ICD-10-CM

## 2019-07-02 MED ORDER — BD PEN NEEDLE NANO 2ND GEN 32G X 4 MM MISC: 1.0000 | Freq: Two times a day (BID) | 0 refills | Status: DC

## 2019-07-02 MED ORDER — VITAMIN D (ERGOCALCIFEROL) 1.25 MG (50000 UNIT) PO CAPS: 50000.0000 [IU] | ORAL_CAPSULE | ORAL | 0 refills | Status: DC

## 2019-07-02 MED ORDER — VICTOZA 18 MG/3ML ~~LOC~~ SOPN: 0.6000 mg | PEN_INJECTOR | SUBCUTANEOUS | 0 refills | Status: DC

## 2019-07-03 NOTE — Progress Notes (Signed)
Chief Complaint:   OBESITY Natalie Kim is here to discuss her progress with her obesity treatment plan along with follow-up of her obesity related diagnoses. Natalie Kim is on Natalie Category 3 Plan and states she is following her eating plan approximately 100% of Natalie time for 1 week. Natalie Kim states she is walking 30 minutes 2 times per week.  Today's visit was #: 2 Starting weight: 241 lbs Starting date: 06/18/2019 Today's weight: 236 lbs Today's date: 07/02/2019 Total lbs lost to date: 5 Total lbs lost since last in-office visit: 5  Interim History: Natalie Kim found Natalie quantity of food to be satisfying. She has occasional night hunger, even after ten ounces of meat at night. She did greek yogurt for breakfast and occasionally she had a snack of strawberries. Natalie Kim did Natalie sandwich option for lunch, which was filling. Natalie Kim did string cheese and berries for snacks. She is not looking to change anything.  Subjective:   Vitamin D deficiency  Natalie Kim's Vitamin D level was 18.8 on 06/18/19. She is not on vit D supplementation. She admits fatigue and denies nausea, vomiting or muscle weakness.  Type 2 diabetes mellitus with hyperglycemia, without long-term current use of insulin (Natalie Kim)  Natalie Kim's last A1c was at 7.1 and last insulin level was at 22.7 (06/18/19). She is not on metformin (hasn't tried anything else).  Lab Results  Component Value Date   HGBA1C 7.1 (H) 06/18/2019   HGBA1C 6.9 (A) 02/12/2019   HGBA1C 6.9 10/17/2018   Lab Results  Component Value Date   LDLCALC 85 06/18/2019   CREATININE 0.76 06/18/2019   Lab Results  Component Value Date   INSULIN 22.7 06/18/2019   Other hyperlipidemia Natalie Kim has hyperlipidemia and her LDL was at 85 on Natalie first lab draw and was previously at 121. She is currently on statin. Natalie Kim has been trying to improve her cholesterol levels with intensive lifestyle modification including a low saturated fat diet, exercise and weight  loss.   Lab Results  Component Value Date   ALT 24 06/18/2019   AST 32 06/18/2019   ALKPHOS 61 06/18/2019   BILITOT 0.4 06/18/2019   Lab Results  Component Value Date   CHOL 182 06/18/2019   HDL 77 06/18/2019   LDLCALC 85 06/18/2019   TRIG 118 06/18/2019   CHOLHDL 3.2 02/12/2019    Assessment/Plan:   Vitamin D deficiency Low Vitamin D level contributes to fatigue and are associated with obesity, breast, and colon cancer. Natalie Kim agrees to continue to take prescription Vitamin D @50 ,000 IU every week #4 with no refills and she will follow-up for routine testing of Vitamin D, at least 2-3 times per year to avoid over-replacement.  Type 2 diabetes mellitus with hyperglycemia, without long-term current use of insulin (Natalie Kim)  Good blood sugar control is important to decrease Natalie likelihood of diabetic complications such as nephropathy, neuropathy, limb loss, blindness, coronary artery disease, and death. Intensive lifestyle modification including diet, exercise and weight loss are Natalie first line of treatment for diabetes. Natalie Kim agreed to start Victoza 0.6 mg sub Q #1 pen with no refills. Prescription was also written today for pen needles #1 box with no refills.  Other hyperlipidemia Cardiovascular risk and specific lipid/LDL goals reviewed.  We discussed several lifestyle modifications today and Natalie Kim will continue to work on diet, exercise and weight loss efforts. We will repeat fasting lipid panel in 3 months. Orders and follow up as documented in patient record.   Counseling Intensive lifestyle modifications  are Natalie first line treatment for this issue. . Dietary changes: Increase soluble fiber. Decrease simple carbohydrates. . Exercise changes: Moderate to vigorous-intensity aerobic activity 150 minutes per week if tolerated. . Lipid-lowering medications: see documented in medical record.  Class 3 severe obesity with serious comorbidity and body mass index (BMI) of 40.0 to  44.9 in adult, unspecified obesity type [facility]) Natalie Kim is currently in Natalie action stage of change. As such, her goal is to continue with weight loss efforts. She has agreed to Natalie Category 3 Plan.   Behavioral modification strategies: increasing lean protein intake, increasing vegetables, meal planning and cooking strategies, keeping healthy foods in Natalie home and planning for success.  Natalie Kim has agreed to follow-up with our clinic in 2 weeks. She was informed of Natalie importance of frequent follow-up visits to maximize her success with intensive lifestyle modifications for her multiple health conditions.   Objective:   Blood pressure 127/83, pulse 66, temperature 98 F (36.7 C), temperature source Oral, height 5\' 3"  (1.6 m), weight 236 lb (107 kg), last menstrual period 03/18/2019, SpO2 95 %. Body mass index is 41.81 kg/m.  General: Cooperative, alert, well developed, in no acute distress. HEENT: Conjunctivae and lids unremarkable. Cardiovascular: Regular rhythm.  Lungs: Normal work of breathing. Neurologic: No focal deficits.   Lab Results  Component Value Date   CREATININE 0.76 06/18/2019   BUN 14 06/18/2019   NA 140 06/18/2019   K 4.2 06/18/2019   CL 104 06/18/2019   CO2 23 06/18/2019   Lab Results  Component Value Date   ALT 24 06/18/2019   AST 32 06/18/2019   ALKPHOS 61 06/18/2019   BILITOT 0.4 06/18/2019   Lab Results  Component Value Date   HGBA1C 7.1 (H) 06/18/2019   HGBA1C 6.9 (A) 02/12/2019   HGBA1C 6.9 10/17/2018   HGBA1C 6.5 (H) 05/29/2018   Lab Results  Component Value Date   INSULIN 22.7 06/18/2019   Lab Results  Component Value Date   TSH 0.854 06/18/2019   Lab Results  Component Value Date   CHOL 182 06/18/2019   HDL 77 06/18/2019   LDLCALC 85 06/18/2019   TRIG 118 06/18/2019   CHOLHDL 3.2 02/12/2019   Lab Results  Component Value Date   WBC 5.5 06/18/2019   HGB 15.5 06/18/2019   HCT 45.1 06/18/2019   MCV 88 06/18/2019   PLT 188  06/18/2019   No results found for: IRON, TIBC, FERRITIN   Ref. Range 06/18/2019 15:11  Vitamin D, 25-Hydroxy Latest Ref Range: 30.0 - 100.0 ng/mL 18.8 (L)    Attestation Statements:   Reviewed by clinician on day of visit: allergies, medications, problem list, medical history, surgical history, family history, social history, and previous encounter notes.  Time spent on visit including pre-visit chart review and post-visit care was 27 minutes.   I, 08/16/2019, am acting as transcriptionist for [place] Crane, MD.  I have reviewed Natalie above documentation for accuracy and completeness, and I agree with Natalie above. - Filbert Schilder, MD

## 2019-07-22 ENCOUNTER — Encounter (INDEPENDENT_AMBULATORY_CARE_PROVIDER_SITE_OTHER): Payer: Self-pay | Admitting: Family Medicine

## 2019-07-24 ENCOUNTER — Ambulatory Visit (INDEPENDENT_AMBULATORY_CARE_PROVIDER_SITE_OTHER): Payer: Medicaid Other | Admitting: Family Medicine

## 2019-07-27 ENCOUNTER — Encounter (HOSPITAL_BASED_OUTPATIENT_CLINIC_OR_DEPARTMENT_OTHER): Payer: Medicaid Other | Admitting: Internal Medicine

## 2019-07-31 ENCOUNTER — Ambulatory Visit (HOSPITAL_BASED_OUTPATIENT_CLINIC_OR_DEPARTMENT_OTHER): Payer: Medicaid Other | Admitting: Internal Medicine

## 2019-08-02 ENCOUNTER — Encounter (INDEPENDENT_AMBULATORY_CARE_PROVIDER_SITE_OTHER): Payer: Self-pay | Admitting: Family Medicine

## 2019-08-02 ENCOUNTER — Other Ambulatory Visit: Payer: Self-pay

## 2019-08-02 ENCOUNTER — Ambulatory Visit (INDEPENDENT_AMBULATORY_CARE_PROVIDER_SITE_OTHER): Payer: Medicaid Other | Admitting: Family Medicine

## 2019-08-02 VITALS — BP 136/85 | HR 66 | Temp 97.6°F | Ht 63.0 in | Wt 236.0 lb

## 2019-08-02 DIAGNOSIS — Z6841 Body Mass Index (BMI) 40.0 and over, adult: Secondary | ICD-10-CM | POA: Diagnosis not present

## 2019-08-02 DIAGNOSIS — E66813 Obesity, class 3: Secondary | ICD-10-CM

## 2019-08-02 DIAGNOSIS — E1165 Type 2 diabetes mellitus with hyperglycemia: Secondary | ICD-10-CM

## 2019-08-02 DIAGNOSIS — E559 Vitamin D deficiency, unspecified: Secondary | ICD-10-CM | POA: Diagnosis not present

## 2019-08-02 MED ORDER — VITAMIN D (ERGOCALCIFEROL) 1.25 MG (50000 UNIT) PO CAPS: 50000.0000 [IU] | ORAL_CAPSULE | ORAL | 0 refills | Status: DC

## 2019-08-02 MED ORDER — VICTOZA 18 MG/3ML ~~LOC~~ SOPN: 0.6000 mg | PEN_INJECTOR | SUBCUTANEOUS | 0 refills | Status: DC

## 2019-08-03 DIAGNOSIS — D492 Neoplasm of unspecified behavior of bone, soft tissue, and skin: Secondary | ICD-10-CM | POA: Diagnosis not present

## 2019-08-03 NOTE — Progress Notes (Signed)
Chief Complaint:   OBESITY Natalie Kim is here to discuss her progress with her obesity treatment plan along with follow-up of her obesity related diagnoses. Natalie Kim is on the Category 3 Plan and states she is following her eating plan approximately 50% of the time. Natalie Kim states she was walking for 6 hours 5 times for 1 week.  Today's visit was #: 3 Starting weight: 241 lbs Starting date: 06/18/2019 Today's weight: 236 lbs Today's date: 08/02/2019 Total lbs lost to date: 5 Total lbs lost since last in-office visit: 0  Interim History: Natalie Kim traveled during the last few weeks as her grandfather died (she was in [place]). She would like to get back on track. She voices she is missing chicken and [place]. She is going to the beach for the kids for Spring break.  Subjective:   1. Vitamin D deficiency Loyal denies nausea, vomiting, or muscle weakness, but she notes fatigue. Last Vit D level was 18.8.  2. Type 2 diabetes mellitus with hyperglycemia, without long-term current use of insulin ([place]) Natalie Kim's last A1c was 7.1 and insulin of 22.7. She is doing well on Victoza and she denies GI side effects.  Assessment/Plan:   1. Vitamin D deficiency Low Vitamin D level contributes to fatigue and are associated with obesity, breast, and colon cancer. We will refill prescription Vitamin D for 1 month. Natalie Kim will follow-up for routine testing of Vitamin D, at least 2-3 times per year to avoid over-replacement.  - Vitamin D, Ergocalciferol, (DRISDOL) 1.25 MG (50000 UNIT) CAPS capsule; Take 1 capsule (50,000 Units total) by mouth every 7 (seven) days.  Dispense: 4 capsule; Refill: 0  2. Type 2 diabetes mellitus with hyperglycemia, without long-term current use of insulin (HCC) Good blood sugar control is important to decrease the likelihood of diabetic complications such as nephropathy, neuropathy, limb loss, blindness, coronary artery disease, and death. Intensive lifestyle modification  including diet, exercise and weight loss are the first line of treatment for diabetes. We will refill Victoza 0.6 mg for 1 month.  - liraglutide (VICTOZA) 18 MG/3ML SOPN; Inject 0.1 mLs (0.6 mg total) into the skin every morning.  Dispense: 1 pen; Refill: 0  3. Class 3 severe obesity with serious comorbidity and body mass index (BMI) of 40.0 to 44.9 in adult, unspecified obesity type [facility]) Natalie Kim is currently in the action stage of change. As such, her goal is to continue with weight loss efforts. She has agreed to the Category 3 Plan.   Exercise goals: No exercise has been prescribed at this time.  Behavioral modification strategies: increasing lean protein intake, increasing vegetables, meal planning and cooking strategies, keeping healthy foods in the home and decreasing junk food.  Natalie Kim has agreed to follow-up with our clinic in 2 weeks. She was informed of the importance of frequent follow-up visits to maximize her success with intensive lifestyle modifications for her multiple health conditions.   Objective:   Blood pressure 136/85, pulse 66, temperature 97.6 F (36.4 C), temperature source Oral, height 5\' 3"  (1.6 m), weight 236 lb (107 kg), last menstrual period 07/23/2019, SpO2 95 %. Body mass index is 41.81 kg/m.  General: Cooperative, alert, well developed, in no acute distress. HEENT: Conjunctivae and lids unremarkable. Cardiovascular: Regular rhythm.  Lungs: Normal work of breathing. Neurologic: No focal deficits.   Lab Results  Component Value Date   CREATININE 0.76 06/18/2019   BUN 14 06/18/2019   NA 140 06/18/2019   K 4.2 06/18/2019   CL 104 06/18/2019  CO2 23 06/18/2019   Lab Results  Component Value Date   ALT 24 06/18/2019   AST 32 06/18/2019   ALKPHOS 61 06/18/2019   BILITOT 0.4 06/18/2019   Lab Results  Component Value Date   HGBA1C 7.1 (H) 06/18/2019   HGBA1C 6.9 (A) 02/12/2019   HGBA1C 6.9 10/17/2018   HGBA1C 6.5 (H) 05/29/2018   Lab  Results  Component Value Date   INSULIN 22.7 06/18/2019   Lab Results  Component Value Date   TSH 0.854 06/18/2019   Lab Results  Component Value Date   CHOL 182 06/18/2019   HDL 77 06/18/2019   LDLCALC 85 06/18/2019   TRIG 118 06/18/2019   CHOLHDL 3.2 02/12/2019   Lab Results  Component Value Date   WBC 5.5 06/18/2019   HGB 15.5 06/18/2019   HCT 45.1 06/18/2019   MCV 88 06/18/2019   PLT 188 06/18/2019   No results found for: IRON, TIBC, FERRITIN  Attestation Statements:   Reviewed by clinician on day of visit: allergies, medications, problem list, medical history, surgical history, family history, social history, and previous encounter notes.  Time spent on visit including pre-visit chart review and post-visit care and charting was 22 minutes.    I, Burt Knack, am acting as transcriptionist for Debbra Riding, MD. I have reviewed the above documentation for accuracy and completeness, and I agree with the above. - Debbra Riding, MD

## 2019-08-07 ENCOUNTER — Other Ambulatory Visit: Payer: Self-pay

## 2019-08-07 ENCOUNTER — Ambulatory Visit (HOSPITAL_BASED_OUTPATIENT_CLINIC_OR_DEPARTMENT_OTHER): Payer: Medicaid Other | Attending: Internal Medicine | Admitting: Internal Medicine

## 2019-08-07 VITALS — Ht 63.0 in | Wt 235.0 lb

## 2019-08-07 DIAGNOSIS — R0902 Hypoxemia: Secondary | ICD-10-CM | POA: Diagnosis not present

## 2019-08-07 DIAGNOSIS — R400 Somnolence: Secondary | ICD-10-CM | POA: Diagnosis not present

## 2019-08-07 DIAGNOSIS — R0683 Snoring: Secondary | ICD-10-CM | POA: Diagnosis present

## 2019-08-07 DIAGNOSIS — G4733 Obstructive sleep apnea (adult) (pediatric): Secondary | ICD-10-CM | POA: Diagnosis not present

## 2019-08-09 ENCOUNTER — Ambulatory Visit: Payer: Medicaid Other | Attending: Internal Medicine

## 2019-08-09 DIAGNOSIS — Z23 Encounter for immunization: Secondary | ICD-10-CM

## 2019-08-09 NOTE — Progress Notes (Signed)
   Covid-19 Vaccination Clinic  Name:  Natalie Kim    MRN: [id number] DOB: [birth year]  08/09/2019  [name] was observed post Covid-19 immunization for 15 minutes without incident. She was provided with Vaccine Information Sheet and instruction to access the V-Safe system.   [name] was instructed to call 911 with any severe reactions post vaccine: Marland Kitchen Difficulty breathing  . Swelling of face and throat  . A fast heartbeat  . A bad rash all over body  . Dizziness and weakness   Immunizations Administered    Name Date Dose VIS Date Route   Pfizer COVID-19 Vaccine 08/09/2019 11:53 AM 0.3 mL 04/27/2019 Intramuscular   Manufacturer: [employer], [employer]   Lot: [id number]   NDC: [address]-5

## 2019-08-11 DIAGNOSIS — G4736 Sleep related hypoventilation in conditions classified elsewhere: Secondary | ICD-10-CM

## 2019-08-11 DIAGNOSIS — G4733 Obstructive sleep apnea (adult) (pediatric): Secondary | ICD-10-CM | POA: Diagnosis not present

## 2019-08-11 NOTE — Procedures (Signed)
    Patient Name: Natalie Kim, Natalie Kim Date: 08/07/2019 Gender: Female D.O.B: 12/26/1970 Age (years): 40 Referring Provider: Jonah Blue MD Height (inches): 63 Interpreting Physician: Jetty Duhamel MD, ABSM Weight (lbs): 236 RPSGT: [place] BMI: 42 MRN: [id number] Neck Size: 15.00  CLINICAL INFORMATION Sleep Study Type: HST Indication for sleep study: Daytime Fatigue, Snoring Epworth Sleepiness Score: 5  SLEEP STUDY TECHNIQUE A multi-channel overnight portable sleep study was performed. The channels recorded were: nasal airflow, thoracic respiratory movement, and oxygen saturation with a pulse oximetry. Snoring was also monitored.  MEDICATIONS Patient self administered medications include: none reported.  SLEEP ARCHITECTURE Patient was studied for 362.6 minutes. The sleep efficiency was 98.6 % and the patient was supine for 79.9%. The arousal index was 0.0 per hour.  RESPIRATORY PARAMETERS The overall AHI was 37.6 per hour, with a central apnea index of 0.0 per hour. The oxygen nadir was 70% during sleep.  CARDIAC DATA Mean heart rate during sleep was 69.3 bpm.  IMPRESSIONS - Severe obstructive sleep apnea occurred during this study (AHI = 37.6/h). - No significant central sleep apnea occurred during this study (CAI = 0.0/h). - Oxygen desaturation was noted during this study (Min O2 = 70%). Mean sat 89% - Time with O2 saturation 89% or less was 108.4 minutes. - Patient snored.  DIAGNOSIS - Obstructive Sleep Apnea (327.23 [G47.33 ICD-10]) - Nocturnal Hypoxemia (327.26 [G47.36 ICD-10])  RECOMMENDATIONS - Suggest CPAP titration sleep study or autopap. Other options would be based on clinical judgment. - Be careful with alcohol, sedatives and other CNS depressants that may worsen sleep apnea and disrupt normal sleep architecture. - Sleep hygiene should be reviewed to assess factors that may improve sleep quality. - Weight management and regular exercise  should be initiated or continued.  [Electronically signed] 08/11/2019 10:02 AM  Jetty Duhamel MD, ABSM Diplomate, American Board of Sleep Medicine   NPI: [id number]                         Jetty Duhamel Diplomate, American Board of Sleep Medicine  ELECTRONICALLY SIGNED ON:  08/11/2019, 10:00 AM [facility] PH: [phone]   FX: [phone] ACCREDITED BY THE AMERICAN ACADEMY OF SLEEP MEDICINE

## 2019-08-12 ENCOUNTER — Other Ambulatory Visit: Payer: Self-pay | Admitting: Internal Medicine

## 2019-08-12 DIAGNOSIS — G4733 Obstructive sleep apnea (adult) (pediatric): Secondary | ICD-10-CM

## 2019-08-15 DIAGNOSIS — L600 Ingrowing nail: Secondary | ICD-10-CM | POA: Diagnosis not present

## 2019-08-15 DIAGNOSIS — D492 Neoplasm of unspecified behavior of bone, soft tissue, and skin: Secondary | ICD-10-CM | POA: Diagnosis not present

## 2019-08-20 ENCOUNTER — Encounter (INDEPENDENT_AMBULATORY_CARE_PROVIDER_SITE_OTHER): Payer: Self-pay | Admitting: Physician Assistant

## 2019-08-20 ENCOUNTER — Ambulatory Visit (INDEPENDENT_AMBULATORY_CARE_PROVIDER_SITE_OTHER): Payer: Medicaid Other | Admitting: Physician Assistant

## 2019-08-20 ENCOUNTER — Other Ambulatory Visit: Payer: Self-pay

## 2019-08-20 VITALS — BP 121/81 | HR 70 | Temp 98.3°F | Ht 63.0 in | Wt 234.0 lb

## 2019-08-20 DIAGNOSIS — G4733 Obstructive sleep apnea (adult) (pediatric): Secondary | ICD-10-CM | POA: Diagnosis not present

## 2019-08-20 DIAGNOSIS — Z6841 Body Mass Index (BMI) 40.0 and over, adult: Secondary | ICD-10-CM

## 2019-08-20 DIAGNOSIS — E1165 Type 2 diabetes mellitus with hyperglycemia: Secondary | ICD-10-CM

## 2019-08-20 NOTE — Progress Notes (Signed)
Chief Complaint:   OBESITY Natalie Kim is here to discuss her progress with her obesity treatment plan along with follow-up of her obesity related diagnoses. [name] is on the Category 3 Plan and states she is following her eating plan approximately 95% of the time. [name] states she is walking a lot at [employer].  Today's visit was #: 4 Starting weight: 241 lbs Starting date: 06/18/2019 Today's weight: 234 lbs Today's date: 08/20/2019 Total lbs lost to date: 7 lbs Total lbs lost since last in-office visit: 2 lbs  Interim History: [name] did well with weight loss. She is not eating all of the food for breakfast and is skipping lunch most days. She does fine with dinner.  Subjective:   1. OSA (obstructive sleep apnea) This is a new diagnosis.  CPAP machine was brought home today.  2. Type 2 diabetes mellitus with hyperglycemia, without long-term current use of insulin (HCC) Medications reviewed. She is on Victoza and metformin.  No nausea, vomiting, diarrhea.  Lab Results  Component Value Date   HGBA1C 7.1 (H) 06/18/2019   HGBA1C 6.9 (A) 02/12/2019   HGBA1C 6.9 10/17/2018   Lab Results  Component Value Date   LDLCALC 85 06/18/2019   CREATININE 0.76 06/18/2019   Lab Results  Component Value Date   INSULIN 22.7 06/18/2019   Assessment/Plan:   1. OSA (obstructive sleep apnea) Use CPAP regularly.  2. Type 2 diabetes mellitus with hyperglycemia, without long-term current use of insulin (HCC) Will refill Victoza today, as below. - liraglutide (VICTOZA) 18 MG/3ML SOPN; Inject 0.1 mLs (0.6 mg total) into the skin every morning.  Dispense: 1 pen; Refill: 0  3. Class 3 severe obesity with serious comorbidity and body mass index (BMI) of 40.0 to 44.9 in adult, unspecified obesity type [facility]) [name] is currently in the action stage of change. As such, her goal is to continue with weight loss efforts. She has agreed to the Category 3 Plan.   Exercise goals: For substantial  health benefits, adults should do at least 150 minutes (2 hours and 30 minutes) a week of moderate-intensity, or 75 minutes (1 hour and 15 minutes) a week of vigorous-intensity aerobic physical activity, or an equivalent combination of moderate- and vigorous-intensity aerobic activity. Aerobic activity should be performed in episodes of at least 10 minutes, and preferably, it should be spread throughout the week.  Behavioral modification strategies: meal planning and cooking strategies and keeping healthy foods in the home.  Natalie Kim has agreed to follow-up with our clinic in 2 weeks. She was informed of the importance of frequent follow-up visits to maximize her success with intensive lifestyle modifications for her multiple health conditions.   Objective:   Blood pressure 121/81, pulse 70, temperature 98.3 F (36.8 C), temperature source Oral, height 5\' 3"  (1.6 m), weight 234 lb (106.1 kg), last menstrual period 07/23/2019, SpO2 97 %. Body mass index is 41.45 kg/m.  General: Cooperative, alert, well developed, in no acute distress. HEENT: Conjunctivae and lids unremarkable. Cardiovascular: Regular rhythm.  Lungs: Normal work of breathing. Neurologic: No focal deficits.   Lab Results  Component Value Date   CREATININE 0.76 06/18/2019   BUN 14 06/18/2019   NA 140 06/18/2019   K 4.2 06/18/2019   CL 104 06/18/2019   CO2 23 06/18/2019   Lab Results  Component Value Date   ALT 24 06/18/2019   AST 32 06/18/2019   ALKPHOS 61 06/18/2019   BILITOT 0.4 06/18/2019   Lab Results  Component  Value Date   HGBA1C 7.1 (H) 06/18/2019   HGBA1C 6.9 (A) 02/12/2019   HGBA1C 6.9 10/17/2018   HGBA1C 6.5 (H) 05/29/2018   Lab Results  Component Value Date   INSULIN 22.7 06/18/2019   Lab Results  Component Value Date   TSH 0.854 06/18/2019   Lab Results  Component Value Date   CHOL 182 06/18/2019   HDL 77 06/18/2019   LDLCALC 85 06/18/2019   TRIG 118 06/18/2019   CHOLHDL 3.2 02/12/2019     Lab Results  Component Value Date   WBC 5.5 06/18/2019   HGB 15.5 06/18/2019   HCT 45.1 06/18/2019   MCV 88 06/18/2019   PLT 188 06/18/2019   Attestation Statements:   Reviewed by clinician on day of visit: allergies, medications, problem list, medical history, surgical history, family history, social history, and previous encounter notes.  I, Water quality scientist, CMA, am acting as Location manager for [employer], PA-C.  I have reviewed the above documentation for accuracy and completeness, and I agree with the above. Abby Potash, PA-C

## 2019-08-21 MED ORDER — VICTOZA 18 MG/3ML ~~LOC~~ SOPN: 0.6000 mg | PEN_INJECTOR | SUBCUTANEOUS | 0 refills | Status: DC

## 2019-08-24 DIAGNOSIS — Z20828 Contact with and (suspected) exposure to other viral communicable diseases: Secondary | ICD-10-CM | POA: Diagnosis not present

## 2019-08-31 DIAGNOSIS — D492 Neoplasm of unspecified behavior of bone, soft tissue, and skin: Secondary | ICD-10-CM | POA: Diagnosis not present

## 2019-08-31 DIAGNOSIS — B079 Viral wart, unspecified: Secondary | ICD-10-CM | POA: Diagnosis not present

## 2019-08-31 DIAGNOSIS — L600 Ingrowing nail: Secondary | ICD-10-CM | POA: Diagnosis not present

## 2019-09-03 ENCOUNTER — Ambulatory Visit: Payer: Medicaid Other | Attending: Internal Medicine

## 2019-09-03 DIAGNOSIS — Z23 Encounter for immunization: Secondary | ICD-10-CM

## 2019-09-03 NOTE — Progress Notes (Signed)
   Covid-19 Vaccination Clinic  Name:  Natalie Kim    MRN: [id number] DOB: June 28, 1970  09/03/2019  [name] was observed post Covid-19 immunization for 15 minutes without incident. She was provided with Vaccine Information Sheet and instruction to access the V-Safe system.   [name] was instructed to call 911 with any severe reactions post vaccine: Marland Kitchen Difficulty breathing  . Swelling of face and throat  . A fast heartbeat  . A bad rash all over body  . Dizziness and weakness   Immunizations Administered    Name Date Dose VIS Date Route   Pfizer COVID-19 Vaccine 09/03/2019 12:09 PM 0.3 mL 07/11/2018 Intramuscular   Manufacturer: [employer], [employer]   Lot: [id number]   NDC: [address]-3

## 2019-09-04 ENCOUNTER — Ambulatory Visit (INDEPENDENT_AMBULATORY_CARE_PROVIDER_SITE_OTHER): Payer: BC Managed Care – PPO | Admitting: Physician Assistant

## 2019-09-04 ENCOUNTER — Encounter (INDEPENDENT_AMBULATORY_CARE_PROVIDER_SITE_OTHER): Payer: Self-pay | Admitting: Physician Assistant

## 2019-09-04 ENCOUNTER — Other Ambulatory Visit: Payer: Self-pay

## 2019-09-04 VITALS — BP 111/79 | HR 71 | Temp 98.0°F | Ht 63.0 in | Wt 235.0 lb

## 2019-09-04 DIAGNOSIS — E559 Vitamin D deficiency, unspecified: Secondary | ICD-10-CM | POA: Diagnosis not present

## 2019-09-04 DIAGNOSIS — Z6841 Body Mass Index (BMI) 40.0 and over, adult: Secondary | ICD-10-CM | POA: Diagnosis not present

## 2019-09-04 MED ORDER — VITAMIN D (ERGOCALCIFEROL) 1.25 MG (50000 UNIT) PO CAPS: 50000.0000 [IU] | ORAL_CAPSULE | ORAL | 0 refills | Status: DC

## 2019-09-05 NOTE — Progress Notes (Signed)
Chief Complaint:   Natalie Kim is here to discuss her progress with her Natalie treatment plan along with follow-up of her Natalie related diagnoses. [name] is on the Category 3 Plan and states she is following her eating plan approximately 70% of the time. [name] states she is exercising 0 minutes 0 times per week.  Today's visit was #: 5 Starting weight: 241 lbs Starting date: 06/18/2019 Today's weight: 235 lbs Today's date: 09/04/2019 Total lbs lost to date: 6 Total lbs lost since last in-office visit: 0  Interim History: Natalie Kim reports that her mother recently passed away. She did not follow the plan closely during that time. She states she is ready to get back on track.  Subjective:   Vitamin D deficiency. [name] is on prescription Vitamin D weekly. No nausea, vomiting, or muscle weakness. Last Vitamin D 18.8 on 06/18/2019.  Assessment/Plan:   Vitamin D deficiency. Low Vitamin D level contributes to fatigue and are associated with Natalie, breast, and colon cancer. She was given a refill on her Vitamin D, Ergocalciferol, (DRISDOL) 1.25 MG (50000 UNIT) CAPS capsule every week #4 with 0 refills and will follow-up for routine testing of Vitamin D, at least 2-3 times per year to avoid over-replacement.    Class 3 severe Natalie with serious comorbidity and body mass index (BMI) of 40.0 to 44.9 in adult, unspecified Natalie type ([place]).  [name] is currently in the action stage of change. As such, her goal is to continue with weight loss efforts. She has agreed to the Category 3 Plan.   Exercise goals: For substantial health benefits, adults should do at least 150 minutes (2 hours and 30 minutes) a week of moderate-intensity, or 75 minutes (1 hour and 15 minutes) a week of vigorous-intensity aerobic physical activity, or an equivalent combination of moderate- and vigorous-intensity aerobic activity. Aerobic activity should be performed in episodes of at least  10 minutes, and preferably, it should be spread throughout the week.  Behavioral modification strategies: no skipping meals and planning for success.  Natalie Kim has agreed to follow-up with our clinic in 2 weeks. She was informed of the importance of frequent follow-up visits to maximize her success with intensive lifestyle modifications for her multiple health conditions.   Objective:   Blood pressure 111/79, pulse 71, temperature 98 F (36.7 C), temperature source Oral, height 5\' 3"  (1.6 m), weight 235 lb (106.6 kg), SpO2 97 %. Body mass index is 41.63 kg/m.  General: Cooperative, alert, well developed, in no acute distress. HEENT: Conjunctivae and lids unremarkable. Cardiovascular: Regular rhythm.  Lungs: Normal work of breathing. Neurologic: No focal deficits.   Lab Results  Component Value Date   CREATININE 0.76 06/18/2019   BUN 14 06/18/2019   NA 140 06/18/2019   K 4.2 06/18/2019   CL 104 06/18/2019   CO2 23 06/18/2019   Lab Results  Component Value Date   ALT 24 06/18/2019   AST 32 06/18/2019   ALKPHOS 61 06/18/2019   BILITOT 0.4 06/18/2019   Lab Results  Component Value Date   HGBA1C 7.1 (H) 06/18/2019   HGBA1C 6.9 (A) 02/12/2019   HGBA1C 6.9 10/17/2018   HGBA1C 6.5 (H) 05/29/2018   Lab Results  Component Value Date   INSULIN 22.7 06/18/2019   Lab Results  Component Value Date   TSH 0.854 06/18/2019   Lab Results  Component Value Date   CHOL 182 06/18/2019   HDL 77 06/18/2019   LDLCALC 85 06/18/2019  TRIG 118 06/18/2019   CHOLHDL 3.2 02/12/2019   Lab Results  Component Value Date   WBC 5.5 06/18/2019   HGB 15.5 06/18/2019   HCT 45.1 06/18/2019   MCV 88 06/18/2019   PLT 188 06/18/2019   No results found for: IRON, TIBC, FERRITIN  Attestation Statements:   Reviewed by clinician on day of visit: allergies, medications, problem list, medical history, surgical history, family history, social history, and previous encounter notes.  IMarianna Payment, am acting as transcriptionist for [name], PA-C   I have reviewed the above documentation for accuracy and completeness, and I agree with the above. [name], PA-C

## 2019-09-19 ENCOUNTER — Ambulatory Visit (INDEPENDENT_AMBULATORY_CARE_PROVIDER_SITE_OTHER): Payer: BC Managed Care – PPO | Admitting: Physician Assistant

## 2019-09-19 ENCOUNTER — Encounter (INDEPENDENT_AMBULATORY_CARE_PROVIDER_SITE_OTHER): Payer: Self-pay | Admitting: Physician Assistant

## 2019-09-19 ENCOUNTER — Other Ambulatory Visit: Payer: Self-pay

## 2019-09-19 VITALS — BP 119/89 | HR 65 | Temp 98.4°F | Ht 63.0 in | Wt 233.0 lb

## 2019-09-19 DIAGNOSIS — E1169 Type 2 diabetes mellitus with other specified complication: Secondary | ICD-10-CM | POA: Diagnosis not present

## 2019-09-19 DIAGNOSIS — Z9189 Other specified personal risk factors, not elsewhere classified: Secondary | ICD-10-CM

## 2019-09-19 DIAGNOSIS — E785 Hyperlipidemia, unspecified: Secondary | ICD-10-CM | POA: Diagnosis not present

## 2019-09-19 DIAGNOSIS — E7849 Other hyperlipidemia: Secondary | ICD-10-CM

## 2019-09-19 DIAGNOSIS — Z6841 Body Mass Index (BMI) 40.0 and over, adult: Secondary | ICD-10-CM

## 2019-09-19 MED ORDER — BD PEN NEEDLE NANO 2ND GEN 32G X 4 MM MISC: 1.0000 | Freq: Two times a day (BID) | 0 refills | Status: DC

## 2019-09-19 NOTE — Progress Notes (Signed)
Chief Complaint:   OBESITY Natalie Kim is here to discuss her progress with her obesity treatment plan along with follow-up of her obesity related diagnoses. Natalie Kim is on the Category 3 Plan and states she is following her eating plan approximately 95% of the time. Natalie Kim states she is walking 90 minutes 5-7 times per week.  Today's visit was #: 6 Starting weight: 241 lbs Starting date: 06/18/2019 Today's weight: 233 lbs Today's date: 09/19/2019 Total lbs lost to date: 8 Total lbs lost since last in-office visit: 2  Interim History: Natalie Kim did a good job staying on plan. She notes that she is never hungry, but she is able to eat through her non-hunger. She states she is walking more often.  Subjective:   Type 2 diabetes mellitus with hyperlipidemia ([place]). Natalie Kim is not checking blood sugars at home. She is on Victoza. No nausea, vomiting, or diarrhea. No hypoglycemia.   Lab Results  Component Value Date   HGBA1C 7.1 (H) 06/18/2019   HGBA1C 6.9 (A) 02/12/2019   HGBA1C 6.9 10/17/2018   Lab Results  Component Value Date   LDLCALC 85 06/18/2019   CREATININE 0.76 06/18/2019   Lab Results  Component Value Date   INSULIN 22.7 06/18/2019   Other hyperlipidemia. Natalie Kim is on lovastatin. No chest pain or myalgias. She is exercising regularly.  Lab Results  Component Value Date   CHOL 182 06/18/2019   HDL 77 06/18/2019   LDLCALC 85 06/18/2019   TRIG 118 06/18/2019   CHOLHDL 3.2 02/12/2019   Lab Results  Component Value Date   ALT 24 06/18/2019   AST 32 06/18/2019   ALKPHOS 61 06/18/2019   BILITOT 0.4 06/18/2019   The 10-year ASCVD risk score Natalie Bussing DC Jr., et al., 2013) is: 0.9%   Values used to calculate the score:     Age: 49 years     Sex: Female     Is Non-Hispanic African American: No     Diabetic: Yes     Tobacco smoker: No     Systolic Blood Pressure: 673 mmHg     Is BP treated: No     HDL Cholesterol: 77 mg/dL     Total Cholesterol: 182  mg/dL  At risk for heart disease. Natalie Kim is at a higher than average risk for cardiovascular disease due to obesity.   Assessment/Plan:   Type 2 diabetes mellitus with hyperlipidemia ([place]). Good blood sugar control is important to decrease the likelihood of diabetic complications such as nephropathy, neuropathy, limb loss, blindness, coronary artery disease, and death. Intensive lifestyle modification including diet, exercise and weight loss are the first line of treatment for diabetes. Refill was given on needles #100 with 0 refills.  Other hyperlipidemia. Cardiovascular risk and specific lipid/LDL goals reviewed.  We discussed several lifestyle modifications today and Natalie Kim will continue to work on diet, exercise and weight loss efforts. Orders and follow up as documented in patient record. Natalie Kim will continue her medication as directed.  Counseling Intensive lifestyle modifications are the first line treatment for this issue. . Dietary changes: Increase soluble fiber. Decrease simple carbohydrates. . Exercise changes: Moderate to vigorous-intensity aerobic activity 150 minutes per week if tolerated. . Lipid-lowering medications: see documented in medical record.  At risk for heart disease. Natalie Kim was given approximately 15 minutes of coronary artery disease prevention counseling today. She is 49 y.o. female and has risk factors for heart disease including obesity. We discussed intensive lifestyle modifications today with an emphasis on  specific weight loss instructions and strategies.   Repetitive spaced learning was employed today to elicit superior memory formation and behavioral change.  Class 3 severe obesity with serious comorbidity and body mass index (BMI) of 40.0 to 44.9 in adult, unspecified obesity type (HCC).  Natalie Kim is currently in the action stage of change. As such, her goal is to continue with weight loss efforts. She has agreed to the Category 3 Plan.   Exercise  goals: For substantial health benefits, adults should do at least 150 minutes (2 hours and 30 minutes) a week of moderate-intensity, or 75 minutes (1 hour and 15 minutes) a week of vigorous-intensity aerobic physical activity, or an equivalent combination of moderate- and vigorous-intensity aerobic activity. Aerobic activity should be performed in episodes of at least 10 minutes, and preferably, it should be spread throughout the week.  Behavioral modification strategies: meal planning and cooking strategies and keeping healthy foods in the home.  Natalie Kim has agreed to follow-up with our clinic in 2 weeks. She was informed of the importance of frequent follow-up visits to maximize her success with intensive lifestyle modifications for her multiple health conditions.   Objective:   Blood pressure 119/89, pulse 65, temperature 98.4 F (36.9 C), temperature source Oral, height 5\' 3"  (1.6 m), weight 233 lb (105.7 kg), SpO2 97 %. Body mass index is 41.27 kg/m.  General: Cooperative, alert, well developed, in no acute distress. HEENT: Conjunctivae and lids unremarkable. Cardiovascular: Regular rhythm.  Lungs: Normal work of breathing. Neurologic: No focal deficits.   Lab Results  Component Value Date   CREATININE 0.76 06/18/2019   BUN 14 06/18/2019   NA 140 06/18/2019   K 4.2 06/18/2019   CL 104 06/18/2019   CO2 23 06/18/2019   Lab Results  Component Value Date   ALT 24 06/18/2019   AST 32 06/18/2019   ALKPHOS 61 06/18/2019   BILITOT 0.4 06/18/2019   Lab Results  Component Value Date   HGBA1C 7.1 (H) 06/18/2019   HGBA1C 6.9 (A) 02/12/2019   HGBA1C 6.9 10/17/2018   HGBA1C 6.5 (H) 05/29/2018   Lab Results  Component Value Date   INSULIN 22.7 06/18/2019   Lab Results  Component Value Date   TSH 0.854 06/18/2019   Lab Results  Component Value Date   CHOL 182 06/18/2019   HDL 77 06/18/2019   LDLCALC 85 06/18/2019   TRIG 118 06/18/2019   CHOLHDL 3.2 02/12/2019   Lab  Results  Component Value Date   WBC 5.5 06/18/2019   HGB 15.5 06/18/2019   HCT 45.1 06/18/2019   MCV 88 06/18/2019   PLT 188 06/18/2019   No results found for: IRON, TIBC, FERRITIN  Attestation Statements:   Reviewed by clinician on day of visit: allergies, medications, problem list, medical history, surgical history, family history, social history, and previous encounter notes.  I04/05/2019, am acting as transcriptionist for Marianna Payment, PA-C   I have reviewed the above documentation for accuracy and completeness, and I agree with the above. [name], PA-C

## 2019-10-03 ENCOUNTER — Ambulatory Visit (INDEPENDENT_AMBULATORY_CARE_PROVIDER_SITE_OTHER): Payer: BC Managed Care – PPO | Admitting: Physician Assistant

## 2019-10-03 ENCOUNTER — Encounter (INDEPENDENT_AMBULATORY_CARE_PROVIDER_SITE_OTHER): Payer: Self-pay | Admitting: Physician Assistant

## 2019-10-03 ENCOUNTER — Other Ambulatory Visit: Payer: Self-pay

## 2019-10-03 VITALS — BP 118/75 | HR 61 | Temp 98.1°F | Ht 63.0 in | Wt 231.0 lb

## 2019-10-03 DIAGNOSIS — E1169 Type 2 diabetes mellitus with other specified complication: Secondary | ICD-10-CM

## 2019-10-03 DIAGNOSIS — Z6841 Body Mass Index (BMI) 40.0 and over, adult: Secondary | ICD-10-CM | POA: Diagnosis not present

## 2019-10-03 DIAGNOSIS — E785 Hyperlipidemia, unspecified: Secondary | ICD-10-CM

## 2019-10-03 DIAGNOSIS — E559 Vitamin D deficiency, unspecified: Secondary | ICD-10-CM

## 2019-10-03 DIAGNOSIS — Z9189 Other specified personal risk factors, not elsewhere classified: Secondary | ICD-10-CM | POA: Diagnosis not present

## 2019-10-03 MED ORDER — VITAMIN D (ERGOCALCIFEROL) 1.25 MG (50000 UNIT) PO CAPS: 50000.0000 [IU] | ORAL_CAPSULE | ORAL | 0 refills | Status: DC

## 2019-10-03 NOTE — Progress Notes (Signed)
Chief Complaint:   OBESITY Natalie Kim is here to discuss her progress with her obesity treatment plan along with follow-up of her obesity related diagnoses. Natalie Kim is on the Category 3 Plan and states she is following her eating plan approximately 100% of the time. Natalie Kim states she is walking for 105 minutes 5 times per week.  Today's visit was #: 7 Starting weight: 241 lbs Starting date: 06/18/2019 Today's weight: 231 lbs Today's date: 10/03/2019 Total lbs lost to date: 8 lbs Total lbs lost since last in-office visit: 2 lbs  Interim History: Natalie Kim states that her hunger is controlled.  She is doing a good job substituting protein when she needs to.  She is using her CPAP regularly.  Subjective:   1. Type 2 diabetes mellitus with hyperlipidemia (HCC) Natalie Kim is on Victoza and metformin.  Last A1c 7.1.  No nausea, vomiting diarrhea, polyphagia, or hypoglycemia.  She is on Mevacor for hyperlipidemia.  Lab Results  Component Value Date   HGBA1C 7.1 (H) 06/18/2019   HGBA1C 6.9 (A) 02/12/2019   HGBA1C 6.9 10/17/2018   Lab Results  Component Value Date   LDLCALC 85 06/18/2019   CREATININE 0.76 06/18/2019   Lab Results  Component Value Date   INSULIN 22.7 06/18/2019   2. Vitamin D deficiency Natalie Kim's Vitamin D level was 18.8 on 06/18/2019. She is currently taking prescription vitamin D 50,000 IU each week. She denies nausea, vomiting or muscle weakness.  3. At risk for osteoporosis Natalie Kim is at higher risk of osteopenia and osteoporosis due to Vitamin D deficiency.   Assessment/Plan:   1. Type 2 diabetes mellitus with hyperlipidemia (HCC) Good blood sugar control is important to decrease the likelihood of diabetic complications such as nephropathy, neuropathy, limb loss, blindness, coronary artery disease, and death. Intensive lifestyle modification including diet, exercise and weight loss are the first line of treatment for diabetes.  Will check labs today. -  Comprehensive metabolic panel - Hemoglobin A1c - Insulin, random - Lipid Panel With LDL/HDL Ratio  2. Vitamin D deficiency Low Vitamin D level contributes to fatigue and are associated with obesity, breast, and colon cancer. She agrees to continue to take prescription Vitamin D @50 ,000 IU every week and will follow-up for routine testing of Vitamin D, at least 2-3 times per year to avoid over-replacement. - Vitamin D, Ergocalciferol, (DRISDOL) 1.25 MG (50000 UNIT) CAPS capsule; Take 1 capsule (50,000 Units total) by mouth every 7 (seven) days.  Dispense: 4 capsule; Refill: 0 - VITAMIN D 25 Hydroxy (Vit-D Deficiency, Fractures)  3. At risk for osteoporosis Natalie Kim was given approximately 15 minutes of osteoporosis prevention counseling today. Natalie Kim is at risk for osteopenia and osteoporosis due to her Vitamin D deficiency. She was encouraged to take her Vitamin D and follow her higher calcium diet and increase strengthening exercise to help strengthen her bones and decrease her risk of osteopenia and osteoporosis.  Repetitive spaced learning was employed today to elicit superior memory formation and behavioral change.  4. Class 3 severe obesity with serious comorbidity and body mass index (BMI) of 40.0 to 44.9 in adult, unspecified obesity type (HCC) Natalie Kim is currently in the action stage of change. As such, her goal is to continue with weight loss efforts. She has agreed to the Category 3 Plan.   Exercise goals: As is.  Behavioral modification strategies: meal planning and cooking strategies and keeping healthy foods in the home.  Natalie Kim has agreed to follow-up with our clinic in 2-3 weeks. She  was informed of the importance of frequent follow-up visits to maximize her success with intensive lifestyle modifications for her multiple health conditions.   Natalie Kim was informed we would discuss her lab results at her next visit unless there is a critical issue that needs to be addressed  sooner. Natalie Kim agreed to keep her next visit at the agreed upon time to discuss these results.  Objective:   Blood pressure 118/75, pulse 61, temperature 98.1 F (36.7 C), temperature source Oral, height 5\' 3"  (1.6 m), weight 231 lb (104.8 kg), SpO2 95 %. Body mass index is 40.92 kg/m.  General: Cooperative, alert, well developed, in no acute distress. HEENT: Conjunctivae and lids unremarkable. Cardiovascular: Regular rhythm.  Lungs: Normal work of breathing. Neurologic: No focal deficits.   Lab Results  Component Value Date   CREATININE 0.76 06/18/2019   BUN 14 06/18/2019   NA 140 06/18/2019   K 4.2 06/18/2019   CL 104 06/18/2019   CO2 23 06/18/2019   Lab Results  Component Value Date   ALT 24 06/18/2019   AST 32 06/18/2019   ALKPHOS 61 06/18/2019   BILITOT 0.4 06/18/2019   Lab Results  Component Value Date   HGBA1C 7.1 (H) 06/18/2019   HGBA1C 6.9 (A) 02/12/2019   HGBA1C 6.9 10/17/2018   HGBA1C 6.5 (H) 05/29/2018   Lab Results  Component Value Date   INSULIN 22.7 06/18/2019   Lab Results  Component Value Date   TSH 0.854 06/18/2019   Lab Results  Component Value Date   CHOL 182 06/18/2019   HDL 77 06/18/2019   LDLCALC 85 06/18/2019   TRIG 118 06/18/2019   CHOLHDL 3.2 02/12/2019   Lab Results  Component Value Date   WBC 5.5 06/18/2019   HGB 15.5 06/18/2019   HCT 45.1 06/18/2019   MCV 88 06/18/2019   PLT 188 06/18/2019   Attestation Statements:   Reviewed by clinician on day of visit: allergies, medications, problem list, medical history, surgical history, family history, social history, and previous encounter notes.  I, 08/16/2019, CMA, am acting as Insurance claims handler for Energy manager, PA-C.  I have reviewed the above documentation for accuracy and completeness, and I agree with the above. [employer], PA-C

## 2019-10-04 LAB — COMPREHENSIVE METABOLIC PANEL
ALT: 14 IU/L (ref 0–32)
AST: 21 IU/L (ref 0–40)
Albumin/Globulin Ratio: 1.9 (ref 1.2–2.2)
Albumin: 4.5 g/dL (ref 3.8–4.8)
Alkaline Phosphatase: 54 IU/L (ref 48–121)
BUN/Creatinine Ratio: 26 — ABNORMAL HIGH (ref 9–23)
BUN: 19 mg/dL (ref 6–24)
Bilirubin Total: 0.5 mg/dL (ref 0.0–1.2)
CO2: 22 mmol/L (ref 20–29)
Calcium: 9.5 mg/dL (ref 8.7–10.2)
Chloride: 107 mmol/L — ABNORMAL HIGH (ref 96–106)
Creatinine, Ser: 0.73 mg/dL (ref 0.57–1.00)
GFR calc Af Amer: 113 mL/min/{1.73_m2} (ref >59)
GFR calc non Af Amer: 98 mL/min/{1.73_m2} (ref >59)
Globulin, Total: 2.4 g/dL (ref 1.5–4.5)
Glucose: 92 mg/dL (ref 65–99)
Potassium: 4.1 mmol/L (ref 3.5–5.2)
Sodium: 144 mmol/L (ref 134–144)
Total Protein: 6.9 g/dL (ref 6.0–8.5)

## 2019-10-04 LAB — LIPID PANEL WITH LDL/HDL RATIO
Cholesterol, Total: 191 mg/dL (ref 100–199)
HDL: 65 mg/dL (ref >39)
LDL Chol Calc (NIH): 106 mg/dL — ABNORMAL HIGH (ref 0–99)
LDL/HDL Ratio: 1.6 ratio (ref 0.0–3.2)
Triglycerides: 111 mg/dL (ref 0–149)
VLDL Cholesterol Cal: 20 mg/dL (ref 5–40)

## 2019-10-04 LAB — HEMOGLOBIN A1C
Est. average glucose Bld gHb Est-mCnc: 131 mg/dL
Hgb A1c MFr Bld: 6.2 % — ABNORMAL HIGH (ref 4.8–5.6)

## 2019-10-04 LAB — VITAMIN D 25 HYDROXY (VIT D DEFICIENCY, FRACTURES): Vit D, 25-Hydroxy: 31.9 ng/mL (ref 30.0–100.0)

## 2019-10-04 LAB — INSULIN, RANDOM: INSULIN: 11.6 u[IU]/mL (ref 2.6–24.9)

## 2019-10-05 DIAGNOSIS — D492 Neoplasm of unspecified behavior of bone, soft tissue, and skin: Secondary | ICD-10-CM | POA: Diagnosis not present

## 2019-10-05 DIAGNOSIS — B079 Viral wart, unspecified: Secondary | ICD-10-CM | POA: Diagnosis not present

## 2019-10-22 DIAGNOSIS — Z1231 Encounter for screening mammogram for malignant neoplasm of breast: Secondary | ICD-10-CM | POA: Diagnosis not present

## 2019-10-24 ENCOUNTER — Encounter (INDEPENDENT_AMBULATORY_CARE_PROVIDER_SITE_OTHER): Payer: Self-pay | Admitting: Physician Assistant

## 2019-10-24 ENCOUNTER — Ambulatory Visit (INDEPENDENT_AMBULATORY_CARE_PROVIDER_SITE_OTHER): Payer: BC Managed Care – PPO | Admitting: Physician Assistant

## 2019-10-24 ENCOUNTER — Other Ambulatory Visit: Payer: Self-pay

## 2019-10-24 VITALS — BP 104/72 | HR 71 | Temp 98.5°F | Ht 63.0 in | Wt 235.0 lb

## 2019-10-24 DIAGNOSIS — E559 Vitamin D deficiency, unspecified: Secondary | ICD-10-CM | POA: Diagnosis not present

## 2019-10-24 DIAGNOSIS — Z9189 Other specified personal risk factors, not elsewhere classified: Secondary | ICD-10-CM

## 2019-10-24 DIAGNOSIS — Z6841 Body Mass Index (BMI) 40.0 and over, adult: Secondary | ICD-10-CM | POA: Diagnosis not present

## 2019-10-24 DIAGNOSIS — F3289 Other specified depressive episodes: Secondary | ICD-10-CM | POA: Diagnosis not present

## 2019-10-24 MED ORDER — VITAMIN D (ERGOCALCIFEROL) 1.25 MG (50000 UNIT) PO CAPS: 50000.0000 [IU] | ORAL_CAPSULE | ORAL | 0 refills | Status: DC

## 2019-10-24 NOTE — Progress Notes (Signed)
Chief Complaint:   OBESITY Natalie Kim is here to discuss her progress with her obesity treatment plan along with follow-up of her obesity related diagnoses. Natalie Kim is on the Category 3 Plan and states she is following her eating plan approximately 50% of the time. Natalie Kim states she is exercising 0 minutes 0 times per week.  Today's visit was #: 8 Starting weight: 241 lbs Starting date: 06/18/2019 Today's weight: 235 lbs Today's date: 10/24/2019 Total lbs lost to date: 6 Total lbs lost since last in-office visit: 0  Interim History: Natalie Kim states that her dog recently died and she did some comfort eating. She enjoys Category 3 and wants to continue with it.  Subjective:   Other depression, with emotional eating. Natalie Kim is struggling with emotional eating and using food for comfort to the extent that it is negatively impacting her health. She has been working on behavior modification techniques to help reduce her emotional eating and has been somewhat successful. She shows no sign of suicidal or homicidal ideations. Natalie Kim is on Lexapro. She reports issues with emotional eating and wants some strategies.  Vitamin D deficiency. Natalie Kim is on prescription Vitamin D. No nausea, vomiting, or muscle weakness. Last Vitamin D was 31.9 on 10/03/2019.  At risk for osteoporosis. Natalie Kim is at higher risk of osteopenia and osteoporosis due to Vitamin D deficiency.   Assessment/Plan:   Other depression, with emotional eating. Behavior modification techniques were discussed today to help Natalie Kim deal with her emotional/non-hunger eating behaviors.  Orders and follow up as documented in patient record. Natalie Kim will be referred to Dr. Mallie Mussel, our bariatric psychologist, for evaluation.  Vitamin D deficiency. Low Vitamin D level contributes to fatigue and are associated with obesity, breast, and colon cancer. She was given a refill on her Vitamin D, Ergocalciferol, (DRISDOL) 1.25  MG (50000 UNIT) CAPS capsule every week #4 with 0 refills and will follow-up for routine testing of Vitamin D, at least 2-3 times per year to avoid over-replacement.   At risk for osteoporosis. Natalie Kim was given approximately 15 minutes of osteoporosis prevention counseling today. Natalie Kim is at risk for osteopenia and osteoporosis due to her Vitamin D deficiency. She was encouraged to take her Vitamin D and follow her higher calcium diet and increase strengthening exercise to help strengthen her bones and decrease her risk of osteopenia and osteoporosis.  Repetitive spaced learning was employed today to elicit superior memory formation and behavioral change.  Class 3 severe obesity with serious comorbidity and body mass index (BMI) of 40.0 to 44.9 in adult, unspecified obesity type (Natalie Kim).  Natalie Kim is currently in the action stage of change. As such, her goal is to continue with weight loss efforts. She has agreed to the Category 3 Plan.   Exercise goals: For substantial health benefits, adults should do at least 150 minutes (2 hours and 30 minutes) a week of moderate-intensity, or 75 minutes (1 hour and 15 minutes) a week of vigorous-intensity aerobic physical activity, or an equivalent combination of moderate- and vigorous-intensity aerobic activity. Aerobic activity should be performed in episodes of at least 10 minutes, and preferably, it should be spread throughout the week.  Behavioral modification strategies: meal planning and cooking strategies and keeping healthy foods in the home.  Natalie Kim has agreed to follow-up with our clinic in 2-3 weeks. She was informed of the importance of frequent follow-up visits to maximize her success with intensive lifestyle modifications for her multiple health conditions.   Objective:   Blood  pressure 104/72, pulse 71, temperature 98.5 F (36.9 C), temperature source Oral, height 5\' 3"  (1.6 m), weight 235 lb (106.6 kg), SpO2 95 %. Body mass index is  41.63 kg/m.  General: Cooperative, alert, well developed, in no acute distress. HEENT: Conjunctivae and lids unremarkable. Cardiovascular: Regular rhythm.  Lungs: Normal work of breathing. Neurologic: No focal deficits.   Lab Results  Component Value Date   CREATININE 0.73 10/03/2019   BUN 19 10/03/2019   NA 144 10/03/2019   K 4.1 10/03/2019   CL 107 (H) 10/03/2019   CO2 22 10/03/2019   Lab Results  Component Value Date   ALT 14 10/03/2019   AST 21 10/03/2019   ALKPHOS 54 10/03/2019   BILITOT 0.5 10/03/2019   Lab Results  Component Value Date   HGBA1C 6.2 (H) 10/03/2019   HGBA1C 7.1 (H) 06/18/2019   HGBA1C 6.9 (A) 02/12/2019   HGBA1C 6.9 10/17/2018   HGBA1C 6.5 (H) 05/29/2018   Lab Results  Component Value Date   INSULIN 11.6 10/03/2019   INSULIN 22.7 06/18/2019   Lab Results  Component Value Date   TSH 0.854 06/18/2019   Lab Results  Component Value Date   CHOL 191 10/03/2019   HDL 65 10/03/2019   LDLCALC 106 (H) 10/03/2019   TRIG 111 10/03/2019   CHOLHDL 3.2 02/12/2019   Lab Results  Component Value Date   WBC 5.5 06/18/2019   HGB 15.5 06/18/2019   HCT 45.1 06/18/2019   MCV 88 06/18/2019   PLT 188 06/18/2019   No results found for: IRON, TIBC, FERRITIN  Attestation Statements:   Reviewed by clinician on day of visit: allergies, medications, problem list, medical history, surgical history, family history, social history, and previous encounter notes.  I04/05/2019, am acting as transcriptionist for Marianna Payment, PA-C   I have reviewed the above documentation for accuracy and completeness, and I agree with the above. [name], PA-C

## 2019-10-25 ENCOUNTER — Encounter (INDEPENDENT_AMBULATORY_CARE_PROVIDER_SITE_OTHER): Payer: Self-pay
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
## 2019-11-13 ENCOUNTER — Ambulatory Visit (INDEPENDENT_AMBULATORY_CARE_PROVIDER_SITE_OTHER): Payer: BC Managed Care – PPO | Admitting: Physician Assistant

## 2019-11-13 ENCOUNTER — Telehealth (INDEPENDENT_AMBULATORY_CARE_PROVIDER_SITE_OTHER): Payer: BC Managed Care – PPO | Admitting: Psychology

## 2019-11-13 DIAGNOSIS — F5089 Other specified eating disorder: Secondary | ICD-10-CM

## 2019-11-14 NOTE — Progress Notes (Signed)
  Office: [phone]  /  Fax: [phone]    Date: November 28, 2019   Appointment Start Time: 1:50pm Duration: 27 minutes Provider: Lawerance Cruel, Psy.D. Type of Session: Individual Therapy  Location of Patient: Work Location of Provider: Provider's Home Type of Contact: Telepsychological Visit via MyChart Video Visit  Session Content: Natalie Kim is a 49 y.o. female presenting via MyChart Video Visit for a follow-up appointment to address the previously established treatment goal of increasing coping skills. Today's appointment was a telepsychological visit due to COVID-19. [name] provided verbal consent for today's telepsychological appointment and she is aware she is responsible for securing confidentiality on her end of the session. Prior to proceeding with today's appointment, [name]'s physical location at the time of this appointment was obtained as well a phone number she could be reached at in the event of technical difficulties. [name] and this provider participated in today's telepsychological service.   This provider conducted a brief check-in. [name] shared about her beach vacation, noting she skipped meals. When she did eat, she stated she engaged in portion control and made better choices. Emotional and physical hunger were reviewed. Psychoeducation regarding triggers for emotional eating was provided. [name] was provided a handout, and encouraged to utilize the handout between now and the next appointment to increase awareness of triggers and frequency. [name] agreed. This provider also discussed behavioral strategies for specific triggers, such as placing the utensil down when conversing to avoid mindless eating. [name] provided verbal consent during today's appointment for this provider to send a handout about triggers via e-mail. [name] was receptive to today's appointment as evidenced by openness to sharing, responsiveness to feedback, and willingness to explore  triggers for emotional eating.  Mental Status Examination:  Appearance: well groomed and appropriate hygiene  Behavior: appropriate to circumstances Mood: euthymic Affect: mood congruent Speech: normal in rate, volume, and tone Eye Contact: appropriate Psychomotor Activity: appropriate Gait: unable to assess Thought Process: linear, logical, and goal directed  Thought Content/Perception: denies suicidal and homicidal ideation, plan, and intent and no hallucinations, delusions, bizarre thinking or behavior reported or observed Orientation: time, person, place, and purpose of appointment Memory/Concentration: memory, attention, language, and fund of knowledge intact  Insight/Judgment: fair   Interventions:  Conducted a brief chart review Provided empathic reflections and validation Reviewed content from the previous session Employed supportive psychotherapy interventions to facilitate reduced distress and to improve coping skills with identified stressors Psychoeducation provided regarding triggers for emotional eating  DSM-5 Diagnosis(es): 307.59 (F50.8) Other Specified Feeding or Eating Disorder, Emotional Eating Behaviors  Treatment Goal & Progress: During the initial appointment with this provider, the following treatment goal was established: increase coping skills. [name] has demonstrated progress in her goal as evidenced by increased awareness of hunger patterns.   Plan: The next appointment will be scheduled in approximately two weeks, which will be via MyChart Video Visit. The next session will focus on working towards the established treatment goal.

## 2019-11-28 ENCOUNTER — Telehealth (INDEPENDENT_AMBULATORY_CARE_PROVIDER_SITE_OTHER): Payer: BC Managed Care – PPO | Admitting: Psychology

## 2019-11-28 ENCOUNTER — Other Ambulatory Visit: Payer: Self-pay

## 2019-11-28 DIAGNOSIS — F5089 Other specified eating disorder: Secondary | ICD-10-CM | POA: Diagnosis not present

## 2019-11-29 NOTE — Progress Notes (Signed)
  Office: [phone]  /  Fax: [phone]    Date: December 13, 2019   Appointment Start Time: 2:04pm Duration: 22 minutes Provider: Lawerance Cruel, Psy.D. Type of Session: Individual Therapy  Location of Patient: Work Location of Provider: Provider's Home Type of Contact: Telepsychological Visit via MyChart Video Visit  Session Content: This provider called Natalie Kim at 2:02pm as she did not present for the telepsychological appointment. She explained she lost track of time, but noted she could join. As such, today's appointment was initiated 4 minutes late. Natalie Kim is a 49 y.o. female presenting via MyChart Video Visit for a follow-up appointment to address the previously established treatment goal of increasing coping skills. Today's appointment was a telepsychological visit due to COVID-19. Natalie Kim provided verbal consent for today's telepsychological appointment and she is aware she is responsible for securing confidentiality on her end of the session. Prior to proceeding with today's appointment, [name]'s physical location at the time of this appointment was obtained as well a phone number she could be reached at in the event of technical difficulties. Shamel and this provider participated in today's telepsychological service.   This provider conducted a brief check-in. [name] reported her friend passed away yesterday. She further reported she is in the process of getting back on track with her eating habits since seeing [name], PA-C. Triggers for emotional eating were reviewed. Psychoeducation regarding mindfulness was provided. A handout was provided to [place] with further information regarding mindfulness, including exercises. This provider also explained the benefit of mindfulness as it relates to emotional eating. [name] was encouraged to engage in the provided exercises between now and the next appointment with this provider. Natalie Kim agreed. During today's appointment,  [name] was led through a mindfulness exercise involving her senses. [name] provided verbal consent during today's appointment for this provider to send a handout about mindfulness via e-mail. [name] was receptive to today's appointment as evidenced by openness to sharing, responsiveness to feedback, and willingness to engage in mindfulness exercises to assist with coping.  Mental Status Examination:  Appearance: well groomed and appropriate hygiene  Behavior: appropriate to circumstances Mood: euthymic Affect: mood congruent Speech: normal in rate, volume, and tone Eye Contact: appropriate Psychomotor Activity: appropriate Gait: unable to assess Thought Process: linear, logical, and goal directed  Thought Content/Perception: no hallucinations, delusions, bizarre thinking or behavior reported or observed and denies suicidal and homicidal ideation, plan, and intent Orientation: time, person, place, and purpose of appointment Memory/Concentration: memory, attention, language, and fund of knowledge intact  Insight/Judgment: fair  Interventions:  Conducted a brief chart review Provided empathic reflections and validation Reviewed content from the previous session Employed supportive psychotherapy interventions to facilitate reduced distress and to improve coping skills with identified stressors Psychoeducation provided regarding mindfulness Engaged patient in mindfulness exercise(s) Employed acceptance and commitment interventions to emphasize mindfulness and acceptance without struggle  DSM-5 Diagnosis(es): 307.59 (F50.8) Other Specified Feeding or Eating Disorder, Emotional Eating and Binge Eating Behaviors  Treatment Goal & Progress: During the initial appointment with this provider, the following treatment goal was established: increase coping skills. [name] has demonstrated progress in her goal as evidenced by increased awareness of hunger patterns and increased awareness of  triggers for emotional eating. [name] also demonstrates willingness to engage in mindfulness exercises.  Plan: The next appointment will be scheduled in 2-3 weeks, which will be via MyChart Video Visit. The next session will focus on working towards the established treatment goal.

## 2019-11-30 DIAGNOSIS — M2041 Other hammer toe(s) (acquired), right foot: Secondary | ICD-10-CM | POA: Diagnosis not present

## 2019-11-30 DIAGNOSIS — M12272 Villonodular synovitis (pigmented), left ankle and foot: Secondary | ICD-10-CM | POA: Diagnosis not present

## 2019-11-30 DIAGNOSIS — M25572 Pain in left ankle and joints of left foot: Secondary | ICD-10-CM | POA: Diagnosis not present

## 2019-11-30 DIAGNOSIS — M2042 Other hammer toe(s) (acquired), left foot: Secondary | ICD-10-CM | POA: Diagnosis not present

## 2019-11-30 DIAGNOSIS — B079 Viral wart, unspecified: Secondary | ICD-10-CM | POA: Diagnosis not present

## 2019-11-30 DIAGNOSIS — M12271 Villonodular synovitis (pigmented), right ankle and foot: Secondary | ICD-10-CM | POA: Diagnosis not present

## 2019-11-30 DIAGNOSIS — M25571 Pain in right ankle and joints of right foot: Secondary | ICD-10-CM | POA: Diagnosis not present

## 2019-12-04 ENCOUNTER — Other Ambulatory Visit: Payer: Self-pay

## 2019-12-04 ENCOUNTER — Ambulatory Visit (INDEPENDENT_AMBULATORY_CARE_PROVIDER_SITE_OTHER): Payer: BC Managed Care – PPO | Admitting: Physician Assistant

## 2019-12-04 ENCOUNTER — Encounter (INDEPENDENT_AMBULATORY_CARE_PROVIDER_SITE_OTHER): Payer: Self-pay | Admitting: Physician Assistant

## 2019-12-04 VITALS — BP 114/76 | HR 72 | Temp 99.1°F | Ht 63.0 in | Wt 237.0 lb

## 2019-12-04 DIAGNOSIS — E785 Hyperlipidemia, unspecified: Secondary | ICD-10-CM | POA: Diagnosis not present

## 2019-12-04 DIAGNOSIS — Z6841 Body Mass Index (BMI) 40.0 and over, adult: Secondary | ICD-10-CM | POA: Diagnosis not present

## 2019-12-04 DIAGNOSIS — Z9189 Other specified personal risk factors, not elsewhere classified: Secondary | ICD-10-CM

## 2019-12-04 DIAGNOSIS — E1169 Type 2 diabetes mellitus with other specified complication: Secondary | ICD-10-CM

## 2019-12-04 DIAGNOSIS — E559 Vitamin D deficiency, unspecified: Secondary | ICD-10-CM | POA: Diagnosis not present

## 2019-12-04 MED ORDER — VITAMIN D (ERGOCALCIFEROL) 1.25 MG (50000 UNIT) PO CAPS: 50000.0000 [IU] | ORAL_CAPSULE | ORAL | 0 refills | Status: DC

## 2019-12-04 MED ORDER — VICTOZA 18 MG/3ML ~~LOC~~ SOPN: 0.6000 mg | PEN_INJECTOR | SUBCUTANEOUS | 0 refills | Status: DC

## 2019-12-04 NOTE — Progress Notes (Signed)
Chief Complaint:   OBESITY Natalie Kim is here to discuss her progress with her obesity treatment plan along with follow-up of her obesity related diagnoses. Natalie Kim is on the Category 3 Plan and states she is following her eating plan approximately 0% of the time. Ma states she is exercising 0 minutes 0 times per week.  Today's visit was #: 9 Starting weight: 241 lbs Starting date: 06/18/2019 Today's weight: 237 lbs Today's date: 12/04/2019 Total lbs lost to date: 4 Total lbs lost since last in-office visit: 0  Interim History: Natalie Kim reports that she has not been following the plan at all and has been doing her "own thing." She is ready to get back on track.   Subjective:   Type 2 diabetes mellitus with hyperlipidemia (HCC). Natalie Kim is on metformin and Victoza.  Lab Results  Component Value Date   HGBA1C 6.2 (H) 10/03/2019   HGBA1C 7.1 (H) 06/18/2019   HGBA1C 6.9 (A) 02/12/2019   Lab Results  Component Value Date   LDLCALC 106 (H) 10/03/2019   CREATININE 0.73 10/03/2019   Lab Results  Component Value Date   INSULIN 11.6 10/03/2019   INSULIN 22.7 06/18/2019   Vitamin D deficiency. Natalie Kim is on prescription Vitamin D supplementation. No nausea, vomiting, or muscle weakness.    Ref. Range 10/03/2019 15:11  Vitamin D, 25-Hydroxy Latest Ref Range: 30.0 - 100.0 ng/mL 31.9   At risk for heart disease. Natalie Kim is at a higher than average risk for cardiovascular disease due to obesity.   Assessment/Plan:   Type 2 diabetes mellitus with hyperlipidemia (HCC). Good blood sugar control is important to decrease the likelihood of diabetic complications such as nephropathy, neuropathy, limb loss, blindness, coronary artery disease, and death. Intensive lifestyle modification including diet, exercise and weight loss are the first line of treatment for diabetes. Refill was given for Victoza #3 pens with 0 refills.  Vitamin D deficiency. Low Vitamin D level  contributes to fatigue and are associated with obesity, breast, and colon cancer. She was given a refill on her Vitamin D, Ergocalciferol, (DRISDOL) 1.25 MG (50000 UNIT) CAPS capsule  every week #4 with 0 refills and will follow-up for routine testing of Vitamin D, at least 2-3 times per year to avoid over-replacement.   At risk for heart disease. Natalie Kim was given approximately 15 minutes of coronary artery disease prevention counseling today. She is 49 y.o. female and has risk factors for heart disease including obesity. We discussed intensive lifestyle modifications today with an emphasis on specific weight loss instructions and strategies.   Repetitive spaced learning was employed today to elicit superior memory formation and behavioral change.  Class 3 severe obesity with serious comorbidity and body mass index (BMI) of 40.0 to 44.9 in adult, unspecified obesity type (HCC).  Tamsin is currently in the action stage of change. As such, her goal is to continue with weight loss efforts. She has agreed to the Category 3 Plan.   Exercise goals: For substantial health benefits, adults should do at least 150 minutes (2 hours and 30 minutes) a week of moderate-intensity, or 75 minutes (1 hour and 15 minutes) a week of vigorous-intensity aerobic physical activity, or an equivalent combination of moderate- and vigorous-intensity aerobic activity. Aerobic activity should be performed in episodes of at least 10 minutes, and preferably, it should be spread throughout the week.  Behavioral modification strategies: meal planning and cooking strategies and keeping healthy foods in the home.  Natalie Kim has agreed to follow-up  with our clinic in 2 weeks. She was informed of the importance of frequent follow-up visits to maximize her success with intensive lifestyle modifications for her multiple health conditions.   Objective:   Blood pressure 114/76, pulse 72, temperature 99.1 F (37.3 C), temperature source  Oral, height 5\' 3"  (1.6 m), weight 237 lb (107.5 kg), SpO2 96 %. Body mass index is 41.98 kg/m.  General: Cooperative, alert, well developed, in no acute distress. HEENT: Conjunctivae and lids unremarkable. Cardiovascular: Regular rhythm.  Lungs: Normal work of breathing. Neurologic: No focal deficits.   Lab Results  Component Value Date   CREATININE 0.73 10/03/2019   BUN 19 10/03/2019   NA 144 10/03/2019   K 4.1 10/03/2019   CL 107 (H) 10/03/2019   CO2 22 10/03/2019   Lab Results  Component Value Date   ALT 14 10/03/2019   AST 21 10/03/2019   ALKPHOS 54 10/03/2019   BILITOT 0.5 10/03/2019   Lab Results  Component Value Date   HGBA1C 6.2 (H) 10/03/2019   HGBA1C 7.1 (H) 06/18/2019   HGBA1C 6.9 (A) 02/12/2019   HGBA1C 6.9 10/17/2018   HGBA1C 6.5 (H) 05/29/2018   Lab Results  Component Value Date   INSULIN 11.6 10/03/2019   INSULIN 22.7 06/18/2019   Lab Results  Component Value Date   TSH 0.854 06/18/2019   Lab Results  Component Value Date   CHOL 191 10/03/2019   HDL 65 10/03/2019   LDLCALC 106 (H) 10/03/2019   TRIG 111 10/03/2019   CHOLHDL 3.2 02/12/2019   Lab Results  Component Value Date   WBC 5.5 06/18/2019   HGB 15.5 06/18/2019   HCT 45.1 06/18/2019   MCV 88 06/18/2019   PLT 188 06/18/2019   No results found for: IRON, TIBC, FERRITIN  Attestation Statements:   Reviewed by clinician on day of visit: allergies, medications, problem list, medical history, surgical history, family history, social history, and previous encounter notes.  I04/05/2019, am acting as transcriptionist for Marianna Payment, PA-C   I have reviewed the above documentation for accuracy and completeness, and I agree with the above. [name], PA-C

## 2019-12-13 ENCOUNTER — Telehealth (INDEPENDENT_AMBULATORY_CARE_PROVIDER_SITE_OTHER): Payer: BC Managed Care – PPO | Admitting: Psychology

## 2019-12-13 ENCOUNTER — Other Ambulatory Visit: Payer: Self-pay

## 2019-12-13 DIAGNOSIS — F5089 Other specified eating disorder: Secondary | ICD-10-CM | POA: Diagnosis not present

## 2019-12-13 DIAGNOSIS — Z20822 Contact with and (suspected) exposure to covid-19: Secondary | ICD-10-CM | POA: Diagnosis not present

## 2019-12-18 ENCOUNTER — Ambulatory Visit (INDEPENDENT_AMBULATORY_CARE_PROVIDER_SITE_OTHER): Payer: BC Managed Care – PPO | Admitting: Physician Assistant

## 2019-12-18 ENCOUNTER — Other Ambulatory Visit: Payer: Self-pay

## 2019-12-18 ENCOUNTER — Encounter (INDEPENDENT_AMBULATORY_CARE_PROVIDER_SITE_OTHER): Payer: Self-pay | Admitting: Physician Assistant

## 2019-12-18 VITALS — BP 112/76 | HR 67 | Temp 98.1°F | Ht 63.0 in | Wt 236.0 lb

## 2019-12-18 DIAGNOSIS — Z6841 Body Mass Index (BMI) 40.0 and over, adult: Secondary | ICD-10-CM | POA: Diagnosis not present

## 2019-12-18 DIAGNOSIS — E785 Hyperlipidemia, unspecified: Secondary | ICD-10-CM

## 2019-12-18 DIAGNOSIS — E1169 Type 2 diabetes mellitus with other specified complication: Secondary | ICD-10-CM | POA: Diagnosis not present

## 2019-12-18 DIAGNOSIS — Z9189 Other specified personal risk factors, not elsewhere classified: Secondary | ICD-10-CM

## 2019-12-18 DIAGNOSIS — E559 Vitamin D deficiency, unspecified: Secondary | ICD-10-CM | POA: Diagnosis not present

## 2019-12-18 MED ORDER — VITAMIN D (ERGOCALCIFEROL) 1.25 MG (50000 UNIT) PO CAPS: 50000.0000 [IU] | ORAL_CAPSULE | ORAL | 0 refills | Status: DC

## 2019-12-18 NOTE — Progress Notes (Signed)
Chief Complaint:   OBESITY Natalie Kim is here to discuss her progress with her obesity treatment plan along with follow-up of her obesity related diagnoses. Natalie Kim is on the Category 3 Plan and states she is following her eating plan approximately 95% of the time. Natalie Kim states she is walking 45-60 minutes 3 times per week.  Today's visit was #: 10 Starting weight: 241 lbs Starting date: 06/18/2019 Today's weight: 236 lbs Today's date: 12/18/2019 Total lbs lost to date: 5 Total lbs lost since last in-office visit: 1  Interim History: Natalie Kim reports that after her last visit she went to the grocery store and was able to get back on track. She reports skipping 2 dinners due to being tired and going to bed.  Subjective:   Vitamin D deficiency. Natalie Kim is on prescription Vitamin D. No nausea, vomiting, or muscle weakness.    Ref. Range 10/03/2019 15:11  Vitamin D, 25-Hydroxy Latest Ref Range: 30.0 - 100.0 ng/mL 31.9   Type 2 diabetes mellitus with hyperlipidemia (HCC). Natalie Kim is on Victoza and metformin. No nausea, vomiting, or diarrhea.    Lab Results  Component Value Date   HGBA1C 6.2 (H) 10/03/2019   HGBA1C 7.1 (H) 06/18/2019   HGBA1C 6.9 (A) 02/12/2019   Lab Results  Component Value Date   LDLCALC 106 (H) 10/03/2019   CREATININE 0.73 10/03/2019   Lab Results  Component Value Date   INSULIN 11.6 10/03/2019   INSULIN 22.7 06/18/2019   At risk for heart disease. Natalie Kim is at a higher than average risk for cardiovascular disease due to obesity.   Assessment/Plan:   Vitamin D deficiency. Low Vitamin D level contributes to fatigue and are associated with obesity, breast, and colon cancer. She was given a refill on her Vitamin D, Ergocalciferol, (DRISDOL) 1.25 MG (50000 UNIT) CAPS capsule every week #4 with 0 refills and will follow-up for routine testing of Vitamin D, at least 2-3 times per year to avoid over-replacement.   Type 2 diabetes mellitus with  hyperlipidemia (HCC). Good blood sugar control is important to decrease the likelihood of diabetic complications such as nephropathy, neuropathy, limb loss, blindness, coronary artery disease, and death. Intensive lifestyle modification including diet, exercise and weight loss are the first line of treatment for diabetes. Natalie Kim will increase her Victoza to 1.2 mg (no refill).  At risk for heart disease. Natalie Kim was given approximately 15 minutes of coronary artery disease prevention counseling today. She is 49 y.o. female and has risk factors for heart disease including obesity. We discussed intensive lifestyle modifications today with an emphasis on specific weight loss instructions and strategies.   Repetitive spaced learning was employed today to elicit superior memory formation and behavioral change.  Class 3 severe obesity with serious comorbidity and body mass index (BMI) of 40.0 to 44.9 in adult, unspecified obesity type (HCC).  Natalie Kim is currently in the action stage of change. As such, her goal is to continue with weight loss efforts. She has agreed to the Category 3 Plan and will journal 1500 calories and 95 grams of protein daily.   Exercise goals: For substantial health benefits, adults should do at least 150 minutes (2 hours and 30 minutes) a week of moderate-intensity, or 75 minutes (1 hour and 15 minutes) a week of vigorous-intensity aerobic physical activity, or an equivalent combination of moderate- and vigorous-intensity aerobic activity. Aerobic activity should be performed in episodes of at least 10 minutes, and preferably, it should be spread throughout the week.  Behavioral modification strategies: increasing lean protein intake, no skipping meals and keeping a strict food journal.  Natalie Kim has agreed to follow-up with our clinic in 2 weeks. She was informed of the importance of frequent follow-up visits to maximize her success with intensive lifestyle modifications for her  multiple health conditions.   Objective:   Blood pressure 112/76, pulse 67, temperature 98.1 F (36.7 C), temperature source Oral, height 5\' 3"  (1.6 m), weight 236 lb (107 kg), SpO2 96 %. Body mass index is 41.81 kg/m.  General: Cooperative, alert, well developed, in no acute distress. HEENT: Conjunctivae and lids unremarkable. Cardiovascular: Regular rhythm.  Lungs: Normal work of breathing. Neurologic: No focal deficits.   Lab Results  Component Value Date   CREATININE 0.73 10/03/2019   BUN 19 10/03/2019   NA 144 10/03/2019   K 4.1 10/03/2019   CL 107 (H) 10/03/2019   CO2 22 10/03/2019   Lab Results  Component Value Date   ALT 14 10/03/2019   AST 21 10/03/2019   ALKPHOS 54 10/03/2019   BILITOT 0.5 10/03/2019   Lab Results  Component Value Date   HGBA1C 6.2 (H) 10/03/2019   HGBA1C 7.1 (H) 06/18/2019   HGBA1C 6.9 (A) 02/12/2019   HGBA1C 6.9 10/17/2018   HGBA1C 6.5 (H) 05/29/2018   Lab Results  Component Value Date   INSULIN 11.6 10/03/2019   INSULIN 22.7 06/18/2019   Lab Results  Component Value Date   TSH 0.854 06/18/2019   Lab Results  Component Value Date   CHOL 191 10/03/2019   HDL 65 10/03/2019   LDLCALC 106 (H) 10/03/2019   TRIG 111 10/03/2019   CHOLHDL 3.2 02/12/2019   Lab Results  Component Value Date   WBC 5.5 06/18/2019   HGB 15.5 06/18/2019   HCT 45.1 06/18/2019   MCV 88 06/18/2019   PLT 188 06/18/2019   No results found for: IRON, TIBC, FERRITIN  Attestation Statements:   Reviewed by clinician on day of visit: allergies, medications, problem list, medical history, surgical history, family history, social history, and previous encounter notes.  I04/05/2019, am acting as transcriptionist for Marianna Payment, PA-C   I have reviewed the above documentation for accuracy and completeness, and I agree with the above. [name], PA-C

## 2019-12-19 NOTE — Progress Notes (Signed)
  Office: [phone]  /  Fax: [phone]    Date: January 02, 2020   Appointment Start Time: 4:36pm Duration: 20 minutes Provider: Lawerance Cruel, Psy.D. Type of Session: Individual Therapy  Location of Patient: Work Location of Provider: Provider's Home Type of Contact: Telepsychological Visit via MyChart Video Visit  Session Content:  Natalie Kim called the office at 4:34pm due to challenges with connecting. Assistance was provided by this provider. As such, today's appointment was initiated 6 minutes late. Natalie Kim is a 49 y.o. female presenting via MyChart Video Visit for a follow-up appointment to address the previously established treatment goal of increasing coping skills. Today's appointment was a telepsychological visit due to COVID-19. Natalie Kim provided verbal consent for today's telepsychological appointment and she is aware she is responsible for securing confidentiality on her end of the session. Prior to proceeding with today's appointment, Natalie Kim physical location at the time of this appointment was obtained as well a phone number she could be reached at in the event of technical difficulties. Natalie Kim and this provider participated in today's telepsychological service.   This provider conducted a brief check-in. Natalie Kim stated eating is going well and she continues to lose weight. Session focused further on mindfulness to assist with coping. She discussed engaging in shared exercises. Positive reinforcement was provided. Aila was led through a mindfulness exercise (A Taste of Mindfulness) and her experience was processed. Natalie Kim provided verbal consent during today's appointment for this provider to send a handout for today's exercise via e-mail. This provider also discussed the utilization of YouTube for mindfulness exercises (e.g., exercises by Rhae Hammock). Furthermore, termination planning was discussed. Natalie Kim was receptive to a follow-up appointment in 3-4 weeks and  an additional follow-up/termination appointment in 3-4 weeks after that. Natalie Kim was receptive to today's appointment as evidenced by openness to sharing, responsiveness to feedback, and willingness to continue engaging in mindfulness exercises.  Mental Status Examination:  Appearance: well groomed and appropriate hygiene  Behavior: appropriate to circumstances Mood: euthymic Affect: mood congruent Speech: normal in rate, volume, and tone Eye Contact: appropriate Psychomotor Activity: appropriate Gait: unable to assess Thought Process: linear, logical, and goal directed  Thought Content/Perception: denies suicidal and homicidal ideation, plan, and intent and no hallucinations, delusions, bizarre thinking or behavior reported or observed Orientation: time, person, place, and purpose of appointment Memory/Concentration: memory, attention, language, and fund of knowledge intact  Insight/Judgment: good  Interventions:  Conducted a brief chart review Provided empathic reflections and validation Reviewed content from the previous session Provided positive reinforcement Employed supportive psychotherapy interventions to facilitate reduced distress and to improve coping skills with identified stressors Engaged patient in mindfulness exercise(s) Employed acceptance and commitment interventions to emphasize mindfulness and acceptance without struggle Discussed termination planning  DSM-5 Diagnosis(es): 307.59 (F50.8) Other Specified Feeding or Eating Disorder, Emotional Eating and Binge Eating Behaviors  Treatment Goal & Progress: During the initial appointment with this provider, the following treatment goal was established: increase coping skills. Natalie Kim has demonstrated progress in her goal as evidenced by increased awareness of hunger patterns and increased awareness of triggers for emotional eating. Natalie Kim also demonstrates willingness to engage in mindfulness exercises.  Plan: The  next appointment will be scheduled in 3-4 weeks, which will be via MyChart Video Visit. The next session will focus on working towards the established treatment goal.

## 2020-01-02 ENCOUNTER — Encounter (INDEPENDENT_AMBULATORY_CARE_PROVIDER_SITE_OTHER): Payer: Self-pay | Admitting: Physician Assistant

## 2020-01-02 ENCOUNTER — Telehealth (INDEPENDENT_AMBULATORY_CARE_PROVIDER_SITE_OTHER): Payer: BC Managed Care – PPO | Admitting: Psychology

## 2020-01-02 ENCOUNTER — Ambulatory Visit (INDEPENDENT_AMBULATORY_CARE_PROVIDER_SITE_OTHER): Payer: BC Managed Care – PPO | Admitting: Physician Assistant

## 2020-01-02 ENCOUNTER — Other Ambulatory Visit: Payer: Self-pay

## 2020-01-02 VITALS — BP 105/73 | HR 70 | Temp 98.2°F | Ht 63.0 in | Wt 231.0 lb

## 2020-01-02 DIAGNOSIS — Z6841 Body Mass Index (BMI) 40.0 and over, adult: Secondary | ICD-10-CM | POA: Diagnosis not present

## 2020-01-02 DIAGNOSIS — E1169 Type 2 diabetes mellitus with other specified complication: Secondary | ICD-10-CM

## 2020-01-02 DIAGNOSIS — E7849 Other hyperlipidemia: Secondary | ICD-10-CM | POA: Diagnosis not present

## 2020-01-02 DIAGNOSIS — E785 Hyperlipidemia, unspecified: Secondary | ICD-10-CM

## 2020-01-02 DIAGNOSIS — F5089 Other specified eating disorder: Secondary | ICD-10-CM

## 2020-01-03 NOTE — Progress Notes (Signed)
Chief Complaint:   OBESITY Natalie Kim with her obesity treatment plan along with follow-up of her obesity related diagnoses. Natalie Kim is on the Category 3 Plan and states she is following her eating plan approximately 85% of the time. Natalie Kim states she is walking 30-45 minutes 2 times per week.  Today's visit was #: 11 Starting weight: 241 lbs Starting date: 06/18/2019 Today's weight: 231 lbs Today's date: 01/02/2020 Total lbs lost to date: 10 Total lbs lost since last in-office visit: 5  Interim History: Natalie Kim did very well with weight loss and is eating all of the food on the plan. Her hunger is better satisfied with the increase of Victoza to 1.2 mg.  Subjective:   Other hyperlipidemia. Natalie Kim is on lovastatin. No chest pain or myalgias.   Lab Results  Component Value Date   CHOL 191 10/03/2019   HDL 65 10/03/2019   LDLCALC 106 (H) 10/03/2019   TRIG 111 10/03/2019   CHOLHDL 3.2 02/12/2019   Lab Results  Component Value Date   ALT 14 10/03/2019   AST 21 10/03/2019   ALKPHOS 54 10/03/2019   BILITOT 0.5 10/03/2019   The 10-year ASCVD risk score Denman George DC Jr., et al., 2013) is: 0.9%   Values used to calculate the score:     Age: 49 years     Sex: Female     Is Non-Hispanic African American: No     Diabetic: Yes     Tobacco smoker: No     Systolic Blood Pressure: 105 mmHg     Is BP treated: No     HDL Cholesterol: 65 mg/dL     Total Cholesterol: 191 mg/dL  Type 2 diabetes mellitus with hyperlipidemia (HCC). Natalie Kim is on Victoza and metformin. No nausea, vomiting, or diarrhea. No hypoglycemia.  Lab Results  Component Value Date   HGBA1C 6.2 (H) 10/03/2019   HGBA1C 7.1 (H) 06/18/2019   HGBA1C 6.9 (A) 02/12/2019   Lab Results  Component Value Date   LDLCALC 106 (H) 10/03/2019   CREATININE 0.73 10/03/2019   Lab Results  Component Value Date   INSULIN 11.6 10/03/2019   INSULIN 22.7 06/18/2019    Assessment/Plan:   Other hyperlipidemia. Cardiovascular risk and specific lipid/LDL goals reviewed.  We discussed several lifestyle modifications today and Doxie will continue to work on diet, exercise and weight loss efforts. Orders and follow up as documented in patient record. She will continue her medication as directed.   Counseling Intensive lifestyle modifications are the first line treatment for this issue.  Dietary changes: Increase soluble fiber. Decrease simple carbohydrates.  Exercise changes: Moderate to vigorous-intensity aerobic activity 150 minutes per week if tolerated.  Lipid-lowering medications: see documented in medical record.  Type 2 diabetes mellitus with hyperlipidemia (HCC). Natalie Kim will continue her medications as directed.   Class 3 severe obesity with serious comorbidity and body mass index (BMI) of 40.0 to 44.9 in adult, unspecified obesity type (HCC).  Natalie Kim is currently in the action stage of change. As such, her goal is to continue with weight loss efforts. She has agreed to the Category 3 Plan.   Exercise goals: For substantial health benefits, adults should do at least 150 minutes (2 hours and 30 minutes) a week of moderate-intensity, or 75 minutes (1 hour and 15 minutes) a week of vigorous-intensity aerobic physical activity, or an equivalent combination of moderate- and vigorous-intensity aerobic activity. Aerobic activity should be performed in episodes of  at least 10 minutes, and preferably, it should be spread throughout the week.  Behavioral modification strategies: meal planning and cooking strategies and planning for success.  Natalie Kim has agreed to follow-up with our clinic in 2 weeks. She was informed of the importance of frequent follow-up visits to maximize her success with intensive lifestyle modifications for her multiple health conditions.   Objective:   Blood pressure 105/73, pulse 70, temperature 98.2 F (36.8 C), temperature  source Oral, height 5\' 3"  (1.6 m), weight 231 lb (104.8 kg), SpO2 97 %. Body mass index is 40.92 kg/m.  General: Cooperative, alert, well developed, in no acute distress. HEENT: Conjunctivae and lids unremarkable. Cardiovascular: Regular rhythm.  Lungs: Normal work of breathing. Neurologic: No focal deficits.   Lab Results  Component Value Date   CREATININE 0.73 10/03/2019   BUN 19 10/03/2019   NA 144 10/03/2019   K 4.1 10/03/2019   CL 107 (H) 10/03/2019   CO2 22 10/03/2019   Lab Results  Component Value Date   ALT 14 10/03/2019   AST 21 10/03/2019   ALKPHOS 54 10/03/2019   BILITOT 0.5 10/03/2019   Lab Results  Component Value Date   HGBA1C 6.2 (H) 10/03/2019   HGBA1C 7.1 (H) 06/18/2019   HGBA1C 6.9 (A) 02/12/2019   HGBA1C 6.9 10/17/2018   HGBA1C 6.5 (H) 05/29/2018   Lab Results  Component Value Date   INSULIN 11.6 10/03/2019   INSULIN 22.7 06/18/2019   Lab Results  Component Value Date   TSH 0.854 06/18/2019   Lab Results  Component Value Date   CHOL 191 10/03/2019   HDL 65 10/03/2019   LDLCALC 106 (H) 10/03/2019   TRIG 111 10/03/2019   CHOLHDL 3.2 02/12/2019   Lab Results  Component Value Date   WBC 5.5 06/18/2019   HGB 15.5 06/18/2019   HCT 45.1 06/18/2019   MCV 88 06/18/2019   PLT 188 06/18/2019   No results found for: IRON, TIBC, FERRITIN  follow up appointment today.  Attestation Statements:   Reviewed by clinician on day of visit: allergies, medications, problem list, medical history, surgical history, family history, social history, and previous encounter notes.  Time spent on visit including pre-visit chart review and post-visit charting and care was 31 minutes.   I04/05/2019, am acting as transcriptionist for Marianna Payment, PA-C   I have reviewed the above documentation for accuracy and completeness, and I agree with the above. [name], PA-C

## 2020-01-10 ENCOUNTER — Encounter (INDEPENDENT_AMBULATORY_CARE_PROVIDER_SITE_OTHER): Payer: Self-pay | Admitting: Physician Assistant

## 2020-01-10 NOTE — Telephone Encounter (Signed)
Please advise. Thanks.  

## 2020-01-14 NOTE — Progress Notes (Signed)
Office: [phone]  /  Fax: [phone]    Date: January 28, 2020   Appointment Start Time: 4:03pm Duration: 20 minutes Provider: Lawerance Cruel, Psy.D. Type of Session: Individual Therapy  Location of Patient: Work Location of Provider: Provider's Home Type of Contact: Telepsychological Visit via MyChart Video Visit  Session Content: This provider called [name] at 4:02pm as she did not present for the telepsychological appointment. She indicated she forgot about today's appointment, noting she would be able to join. As such, today's appointment was initiated 3 minutes late. Jamilex is a 49 y.o. female presenting for a follow-up appointment to address the previously established treatment goal of increasing coping skills. Today's appointment was a telepsychological visit due to COVID-19. [name] provided verbal consent for today's telepsychological appointment and she is aware she is responsible for securing confidentiality on her end of the session. Prior to proceeding with today's appointment, Lacreasha's physical location at the time of this appointment was obtained as well a phone number she could be reached at in the event of technical difficulties. Miaa and this provider participated in today's telepsychological service.   This provider conducted a brief check-in and verbally administered the PHQ-9 and GAD-7. A risk assessment was completed. Alexina denied experiencing suicidal and homicidal ideation, plan, and intent since the last appointment with this provider. She reported she continues to have easy access to the developed safety plan, and continues to acknowledge understanding regarding the importance of reaching out to trusted individuals and/or emergency resources if she is unable to ensure safety.  Shateria reported work has been "really busy." She also reported experiencing GI symptoms, which she discussed with [name], PA-C resulting in a discontinuation of  Victoza. She stated she has been following her meal plan, adding, "I've actually been doing pretty good." Session focused further on mindfulness to assist with coping. She acknowledged she has not been engaging in exercises due to being busy with work. Thus, the importance of engaging in exercises regularly was reviewed. Caleen was led through a mindfulness breathing exercise and her experience was processed. Urania provided verbal consent during today's appointment for this provider to send the handout for today's exercise via e-mail. Sylvie was receptive to today's appointment as evidenced by openness to sharing, responsiveness to feedback, and willingness to continue engaging in mindfulness exercises.  Mental Status Examination:  Appearance: well groomed and appropriate hygiene  Behavior: appropriate to circumstances Mood: euthymic Affect: mood congruent Speech: normal in rate, volume, and tone Eye Contact: appropriate Psychomotor Activity: appropriate Gait: unable to assess Thought Process: linear, logical, and goal directed  Thought Content/Perception: denies suicidal and homicidal ideation, plan, and intent and no hallucinations, delusions, bizarre thinking or behavior reported or observed Orientation: time, person, place, and purpose of appointment Memory/Concentration: memory, attention, language, and fund of knowledge intact  Insight/Judgment: good  Structured Assessments Results: The Patient Health Questionnaire-9 (PHQ-9) is a self-report measure that assesses symptoms and severity of depression over the course of the last two weeks. Analeese obtained a score of 0. [0= Not at all; 1= Several days; 2= More than half the days; 3= Nearly every day] Little interest or pleasure in doing things 0  Feeling down, depressed, or hopeless 0  Trouble falling or staying asleep, or sleeping too much 0  Feeling tired or having little energy 0  Poor appetite or overeating 0  Feeling bad about  yourself --- or that you are a failure or have let yourself or your family down 0  Trouble concentrating on  things, such as reading the newspaper or watching television 0  Moving or speaking so slowly that other people could have noticed? Or the opposite --- being so fidgety or restless that you have been moving around a lot more than usual 0  Thoughts that you would be better off dead or hurting yourself in some way 0  PHQ-9 Score 0    The Generalized Anxiety Disorder-7 (GAD-7) is a brief self-report measure that assesses symptoms of anxiety over the course of the last two weeks. Aila obtained a score of 1 suggesting minimal anxiety. Hafsa finds the endorsed symptoms to be somewhat difficult. [0= Not at all; 1= Several days; 2= Over half the days; 3= Nearly every day] Feeling nervous, anxious, on edge 0  Not being able to stop or control worrying 0  Worrying too much about different things 0  Trouble relaxing 0  Being so restless that it's hard to sit still 0  Becoming easily annoyed or irritable 1  Feeling afraid as if something awful might happen 0  GAD-7 Score 1   Interventions:  Conducted a brief chart review Verbally administered PHQ-9 and GAD-7 for symptom monitoring Conducted a risk assessment Provided empathic reflections and validation Employed supportive psychotherapy interventions to facilitate reduced distress and to improve coping skills with identified stressors Engaged patient in mindfulness exercise(s) Employed acceptance and commitment interventions to emphasize mindfulness and acceptance without struggle  DSM-5 Diagnosis(es): 307.59 (F50.8) Other Specified Feeding or Eating Disorder, Emotional Eating and Binge Eating Behaviors  Treatment Goal & Progress: During the initial appointment with this provider, the following treatment goal was established: increase coping skills. Teniqua has demonstrated progress in her goal as evidenced by increased awareness of  hunger patterns and increased awareness of triggers for emotional eating. Morgyn also continues to demonstrate willingness to engage in learned skill(s).  Plan: The next appointment will be scheduled in one month, which will be via MyChart Video Visit. The next session will focus on working towards the established treatment goal.

## 2020-01-24 ENCOUNTER — Encounter (INDEPENDENT_AMBULATORY_CARE_PROVIDER_SITE_OTHER): Payer: Self-pay | Admitting: Physician Assistant

## 2020-01-24 ENCOUNTER — Other Ambulatory Visit: Payer: Self-pay

## 2020-01-24 ENCOUNTER — Ambulatory Visit (INDEPENDENT_AMBULATORY_CARE_PROVIDER_SITE_OTHER): Payer: BC Managed Care – PPO | Admitting: Physician Assistant

## 2020-01-24 VITALS — BP 122/81 | HR 71 | Temp 97.6°F | Ht 63.0 in | Wt 234.0 lb

## 2020-01-24 DIAGNOSIS — E1169 Type 2 diabetes mellitus with other specified complication: Secondary | ICD-10-CM

## 2020-01-24 DIAGNOSIS — Z9189 Other specified personal risk factors, not elsewhere classified: Secondary | ICD-10-CM | POA: Diagnosis not present

## 2020-01-24 DIAGNOSIS — E559 Vitamin D deficiency, unspecified: Secondary | ICD-10-CM | POA: Diagnosis not present

## 2020-01-24 DIAGNOSIS — Z6841 Body Mass Index (BMI) 40.0 and over, adult: Secondary | ICD-10-CM | POA: Diagnosis not present

## 2020-01-24 DIAGNOSIS — E785 Hyperlipidemia, unspecified: Secondary | ICD-10-CM | POA: Diagnosis not present

## 2020-01-24 MED ORDER — VITAMIN D (ERGOCALCIFEROL) 1.25 MG (50000 UNIT) PO CAPS: 50000.0000 [IU] | ORAL_CAPSULE | ORAL | 0 refills | Status: DC

## 2020-01-27 NOTE — Progress Notes (Signed)
Chief Complaint:   OBESITY Natalie Kim is here to discuss her progress with her obesity treatment plan along with follow-up of her obesity related diagnoses. Natalie Kim is on the Category 3 Plan and states she is following her eating plan approximately 10% of the time. Natalie Kim states she is exercising for 0 minutes 0 times per week.  Today's visit was #: 12 Starting weight: 241 lbs Starting date: 06/18/2019 Today's weight: 234 lbs Today's date: 01/24/2020 Total lbs lost to date: 7 lbs Total lbs lost since last in-office visit: 0  Interim History: Natalie Kim states that over the last few weeks she has had left lower quadrant pain with nausea.  No vomiting.  She denies black stools or BRBPR, but has had bloating.  She sees GI in 1 month.  She reports that she decreased her Victoza to 0.6 mg and it helped some, but she continues to have some symptoms.  Subjective:   1. Type 2 diabetes mellitus with hyperlipidemia (HCC) Medications reviewed. Diabetic ROS: no polyuria or polydipsia, no chest pain, dyspnea or TIA's, no numbness, tingling or pain in extremities.  She is on Victoza.  Last A1c at goal.  She is due for labs.  Lab Results  Component Value Date   HGBA1C 6.2 (H) 10/03/2019   HGBA1C 7.1 (H) 06/18/2019   HGBA1C 6.9 (A) 02/12/2019   Lab Results  Component Value Date   LDLCALC 106 (H) 10/03/2019   CREATININE 0.73 10/03/2019   Lab Results  Component Value Date   INSULIN 11.6 10/03/2019   INSULIN 22.7 06/18/2019   2. Vitamin D deficiency Natalie Kim's Vitamin D level was 31.9 on 10/03/2019. She is currently taking prescription vitamin D 50,000 IU each week. She denies nausea, vomiting or muscle weakness.  Last vitamin D level was not at goal.  She is due for labs.  3. At risk for osteoporosis Natalie Kim is at higher risk of osteopenia and osteoporosis due to Vitamin D deficiency.   Assessment/Plan:   1. Type 2 diabetes mellitus with hyperlipidemia (HCC) Good blood sugar control is  important to decrease the likelihood of diabetic complications such as nephropathy, neuropathy, limb loss, blindness, coronary artery disease, and death. Intensive lifestyle modification including diet, exercise and weight loss are the first line of treatment for diabetes.  Discontinue Victoza.  2. Vitamin D deficiency Low Vitamin D level contributes to fatigue and are associated with obesity, breast, and colon cancer. She agrees to continue to take prescription Vitamin D @50 ,000 IU every week and will follow-up for routine testing of Vitamin D, at least 2-3 times per year to avoid over-replacement.  -Refill Vitamin D, Ergocalciferol, (DRISDOL) 1.25 MG (50000 UNIT) CAPS capsule; Take 1 capsule (50,000 Units total) by mouth every 7 (seven) days.  Dispense: 4 capsule; Refill: 0  3. At risk for osteoporosis Natalie Kim was given approximately 15 minutes of osteoporosis prevention counseling today. Natalie Kim is at risk for osteopenia and osteoporosis due to her Vitamin D deficiency. She was encouraged to take her Vitamin D and follow her higher calcium diet and increase strengthening exercise to help strengthen her bones and decrease her risk of osteopenia and osteoporosis.  Repetitive spaced learning was employed today to elicit superior memory formation and behavioral change.  4. Class 3 severe obesity with serious comorbidity and body mass index (BMI) of 40.0 to 44.9 in adult, unspecified obesity type (HCC) Natalie Kim is currently in the action stage of change. As such, her goal is to continue with weight loss efforts. She has  agreed to the Category 3 Plan.   Exercise goals: For substantial health benefits, adults should do at least 150 minutes (2 hours and 30 minutes) a week of moderate-intensity, or 75 minutes (1 hour and 15 minutes) a week of vigorous-intensity aerobic physical activity, or an equivalent combination of moderate- and vigorous-intensity aerobic activity. Aerobic activity should be performed  in episodes of at least 10 minutes, and preferably, it should be spread throughout the week.  Behavioral modification strategies: meal planning and cooking strategies and keeping healthy foods in the home.  Check labs at next visit.  Natalie Kim has agreed to follow-up with our clinic in 2 weeks. She was informed of the importance of frequent follow-up visits to maximize her success with intensive lifestyle modifications for her multiple health conditions.   Objective:   Blood pressure 122/81, pulse 71, temperature 97.6 F (36.4 C), temperature source Oral, height 5\' 3"  (1.6 m), weight 234 lb (106.1 kg), SpO2 97 %. Body mass index is 41.45 kg/m.  General: Cooperative, alert, well developed, in no acute distress. HEENT: Conjunctivae and lids unremarkable. Cardiovascular: Regular rhythm.  Lungs: Normal work of breathing. Neurologic: No focal deficits.   Lab Results  Component Value Date   CREATININE 0.73 10/03/2019   BUN 19 10/03/2019   NA 144 10/03/2019   K 4.1 10/03/2019   CL 107 (H) 10/03/2019   CO2 22 10/03/2019   Lab Results  Component Value Date   ALT 14 10/03/2019   AST 21 10/03/2019   ALKPHOS 54 10/03/2019   BILITOT 0.5 10/03/2019   Lab Results  Component Value Date   HGBA1C 6.2 (H) 10/03/2019   HGBA1C 7.1 (H) 06/18/2019   HGBA1C 6.9 (A) 02/12/2019   HGBA1C 6.9 10/17/2018   HGBA1C 6.5 (H) 05/29/2018   Lab Results  Component Value Date   INSULIN 11.6 10/03/2019   INSULIN 22.7 06/18/2019   Lab Results  Component Value Date   TSH 0.854 06/18/2019   Lab Results  Component Value Date   CHOL 191 10/03/2019   HDL 65 10/03/2019   LDLCALC 106 (H) 10/03/2019   TRIG 111 10/03/2019   CHOLHDL 3.2 02/12/2019   Lab Results  Component Value Date   WBC 5.5 06/18/2019   HGB 15.5 06/18/2019   HCT 45.1 06/18/2019   MCV 88 06/18/2019   PLT 188 06/18/2019   Attestation Statements:   Reviewed by clinician on day of visit: allergies, medications, problem list,  medical history, surgical history, family history, social history, and previous encounter notes.  I, 08/16/2019, CMA, am acting as transcriptionist for Insurance claims handler, PA-C  I have reviewed the above documentation for accuracy and completeness, and I agree with the above. [name], PA-C

## 2020-01-28 ENCOUNTER — Telehealth (INDEPENDENT_AMBULATORY_CARE_PROVIDER_SITE_OTHER): Payer: BC Managed Care – PPO | Admitting: Psychology

## 2020-01-28 DIAGNOSIS — F5089 Other specified eating disorder: Secondary | ICD-10-CM | POA: Diagnosis not present

## 2020-02-07 ENCOUNTER — Encounter (INDEPENDENT_AMBULATORY_CARE_PROVIDER_SITE_OTHER): Payer: Self-pay | Admitting: Physician Assistant

## 2020-02-07 ENCOUNTER — Encounter: Payer: Self-pay | Admitting: Gastroenterology

## 2020-02-07 ENCOUNTER — Other Ambulatory Visit: Payer: Self-pay

## 2020-02-07 ENCOUNTER — Ambulatory Visit (INDEPENDENT_AMBULATORY_CARE_PROVIDER_SITE_OTHER): Payer: BC Managed Care – PPO | Admitting: Physician Assistant

## 2020-02-07 VITALS — BP 130/83 | HR 68 | Temp 97.7°F | Ht 63.0 in | Wt 237.0 lb

## 2020-02-07 DIAGNOSIS — E785 Hyperlipidemia, unspecified: Secondary | ICD-10-CM | POA: Diagnosis not present

## 2020-02-07 DIAGNOSIS — E7849 Other hyperlipidemia: Secondary | ICD-10-CM | POA: Diagnosis not present

## 2020-02-07 DIAGNOSIS — E1169 Type 2 diabetes mellitus with other specified complication: Secondary | ICD-10-CM | POA: Diagnosis not present

## 2020-02-07 DIAGNOSIS — Z9189 Other specified personal risk factors, not elsewhere classified: Secondary | ICD-10-CM

## 2020-02-07 DIAGNOSIS — Z6841 Body Mass Index (BMI) 40.0 and over, adult: Secondary | ICD-10-CM

## 2020-02-11 NOTE — Progress Notes (Unsigned)
Office: [phone]  /  Fax: [phone]    Date: February 25, 2020   Appointment Start Time: *** Duration: *** minutes Provider: Lawerance Cruel, Psy.D. Type of Session: Individual Therapy  Location of Patient: {gbptloc:23249} Location of Provider: Provider's Home Type of Contact: Telepsychological Visit via MyChart Video Visit  Session Content: This provider called [name] at 4:03pm as she did not present for the telepsychological appointment. A HIPAA compliant voicemail was left requesting a call back. As such, today's appointment was initiated *** minutes late.  Malynda is a 49 y.o. female presenting for a follow-up appointment to address the previously established treatment goal of increasing coping skills. Today's appointment was a telepsychological visit due to COVID-19. [name] provided verbal consent for today's telepsychological appointment and she is aware she is responsible for securing confidentiality on her end of the session. Prior to proceeding with today's appointment, Leila's physical location at the time of this appointment was obtained as well a phone number she could be reached at in the event of technical difficulties. Latara and this provider participated in today's telepsychological service.   This provider conducted a brief check-in and verbally administered the PHQ-9 and GAD-7. *** A plan was developed to help Belmond cope with emotional eating in the future using learned skills. She wrote down the following plan: focus on hydration; be prepared with snacks congruent to the meal plan; pause to ask questions when triggered to eat (e.g., Am I really hungry?, Is there something bothering me?, and Will I feel better if I eat?); and engage in discussed coping strategies after going through the aforementioned questions. Armanda was receptive to today's appointment as evidenced by openness to sharing, responsiveness to feedback, and {gbreceptiveness:23401}.  Mental  Status Examination:  Appearance: {Appearance:22431} Behavior: {Behavior:22445} Mood: {gbmood:21757} Affect: {Affect:22436} Speech: {Speech:22432} Eye Contact: {Eye Contact:22433} Psychomotor Activity: {Motor Activity:22434} Gait: {gbgait:23404} Thought Process: {thought process:22448}  Thought Content/Perception: {disturbances:22451} Orientation: {Orientation:22437} Memory/Concentration: {gbcognition:22449} Insight/Judgment: {Insight:22446}  Structured Assessments Results: The Patient Health Questionnaire-9 (PHQ-9) is a self-report measure that assesses symptoms and severity of depression over the course of the last two weeks. Clois obtained a score of *** suggesting {GBPHQ9SEVERITY:21752}. Jewelene finds the endorsed symptoms to be {gbphq9difficulty:21754}. [0= Not at all; 1= Several days; 2= More than half the days; 3= Nearly every day] Little interest or pleasure in doing things ***  Feeling down, depressed, or hopeless ***  Trouble falling or staying asleep, or sleeping too much ***  Feeling tired or having little energy ***  Poor appetite or overeating ***  Feeling bad about yourself --- or that you are a failure or have let yourself or your family down ***  Trouble concentrating on things, such as reading the newspaper or watching television ***  Moving or speaking so slowly that other people could have noticed? Or the opposite --- being so fidgety or restless that you have been moving around a lot more than usual ***  Thoughts that you would be better off dead or hurting yourself in some way ***  PHQ-9 Score ***    The Generalized Anxiety Disorder-7 (GAD-7) is a brief self-report measure that assesses symptoms of anxiety over the course of the last two weeks. Kenise obtained a score of *** suggesting {gbgad7severity:21753}. Shaunae finds the endorsed symptoms to be {gbphq9difficulty:21754}. [0= Not at all; 1= Several days; 2= Over half the days; 3= Nearly every day] Feeling  nervous, anxious, on edge ***  Not being able to stop or control worrying ***  Worrying too much  about different things ***  Trouble relaxing ***  Being so restless that it's hard to sit still ***  Becoming easily annoyed or irritable ***  Feeling afraid as if something awful might happen ***  GAD-7 Score ***   Interventions:  {Interventions for Progress Notes:23405}  DSM-5 Diagnosis(es): 307.59 (F50.8) Other Specified Feeding or Eating Disorder, Emotional Eating and Binge Eating Behaviors  Treatment Goal & Progress: During the initial appointment with this provider, the following treatment goal was established: increase coping skills. Meyer has demonstrated progress in her goal as evidenced by {gbtxprogress:22839}. Mekenzie also {gbtxprogress2:22951}.  Plan: The next appointment will be scheduled in {gbweeks:21758}, which will be {gbtxmodality:23402}. The next session will focus on {Plan for Next Appointment:23400}.

## 2020-02-11 NOTE — Progress Notes (Signed)
Chief Complaint:   OBESITY Natalie Kim is here to discuss her progress with her obesity treatment plan along with follow-up of her obesity related diagnoses. Natalie Kim is on the Category 3 Plan and states she is following her eating plan approximately 50% of the time. Natalie Kim states she is doing cardio for 60 minutes 2 times per week.  Today's visit was #: 13 Starting weight: 241 lbs Starting date: 06/18/2019 Today's weight: 237 lbs Today's date: 02/07/2020 Total lbs lost to date: 4 lbs Total lbs lost since last in-office visit: 0  Interim History: Natalie Kim states that since coming off her Victoza due to nausea and abdominal discomfort, she is hungry all day.  She continues to have slight nausea with some discomfort, but symptoms have slightly improved.  She sees GI next month.  Subjective:   1. Type 2 diabetes mellitus with hyperlipidemia (HCC) Medications reviewed. Diabetic ROS: no polyuria or polydipsia, no chest pain, dyspnea or TIA's, no numbness, tingling or pain in extremities.  She stopped her Victoza completely due to previous nausea and abdominal discomfort.  Last A1c 6.2.  She is on metformin.  Lab Results  Component Value Date   HGBA1C 6.2 (H) 10/03/2019   HGBA1C 7.1 (H) 06/18/2019   HGBA1C 6.9 (A) 02/12/2019   Lab Results  Component Value Date   LDLCALC 106 (H) 10/03/2019   CREATININE 0.73 10/03/2019   Lab Results  Component Value Date   INSULIN 11.6 10/03/2019   INSULIN 22.7 06/18/2019   2. Other hyperlipidemia Natalie Kim has hyperlipidemia and has been trying to improve her cholesterol levels with intensive lifestyle modification including a low saturated fat diet, exercise and weight loss. She denies any chest pain, claudication or myalgias.  On Mevacor.  Exercising twice weekly.  Lab Results  Component Value Date   ALT 14 10/03/2019   AST 21 10/03/2019   ALKPHOS 54 10/03/2019   BILITOT 0.5 10/03/2019   Lab Results  Component Value Date   CHOL 191  10/03/2019   HDL 65 10/03/2019   LDLCALC 106 (H) 10/03/2019   TRIG 111 10/03/2019   CHOLHDL 3.2 02/12/2019   3. At risk for hypoglycemia Natalie Kim is at increased risk for hypoglycemia due to changes in diet, diagnosis of diabetes, and/or insulin use. Natalie Kim is not currently taking insulin.   Assessment/Plan:   1. Type 2 diabetes mellitus with hyperlipidemia (HCC) Good blood sugar control is important to decrease the likelihood of diabetic complications such as nephropathy, neuropathy, limb loss, blindness, coronary artery disease, and death. Intensive lifestyle modification including diet, exercise and weight loss are the first line of treatment for diabetes.  Stop metformin until next visit.  Consider restarting Victoza at next visit.  2. Other hyperlipidemia Cardiovascular risk and specific lipid/LDL goals reviewed.  We discussed several lifestyle modifications today and Lorrie will continue to work on diet, exercise and weight loss efforts. Orders and follow up as documented in patient record.  Continue with medication.   Counseling Intensive lifestyle modifications are the first line treatment for this issue. . Dietary changes: Increase soluble fiber. Decrease simple carbohydrates. . Exercise changes: Moderate to vigorous-intensity aerobic activity 150 minutes per week if tolerated. . Lipid-lowering medications: see documented in medical record.  3. At risk for hypoglycemia Natalie Kim was given approximately 15 minutes of counseling today regarding prevention of hypoglycemia. She was advised of symptoms of hypoglycemia. Natalie Kim was instructed to avoid skipping meals, eat regular protein rich meals and schedule low calorie snacks as needed.   Repetitive  spaced learning was employed today to elicit superior memory formation and behavioral change  4. Class 3 severe obesity with serious comorbidity and body mass index (BMI) of 40.0 to 44.9 in adult, unspecified obesity type  [facility]) Natalie Kim is currently in the action stage of change. As such, her goal is to continue with weight loss efforts. She has agreed to the Category 4 Plan.   Exercise goals: For substantial health benefits, adults should do at least 150 minutes (2 hours and 30 minutes) a week of moderate-intensity, or 75 minutes (1 hour and 15 minutes) a week of vigorous-intensity aerobic physical activity, or an equivalent combination of moderate- and vigorous-intensity aerobic activity. Aerobic activity should be performed in episodes of at least 10 minutes, and preferably, it should be spread throughout the week.  Behavioral modification strategies: increasing lean protein intake and meal planning and cooking strategies.  Natalie Kim has agreed to follow-up with our clinic in 2 weeks. She was informed of the importance of frequent follow-up visits to maximize her success with intensive lifestyle modifications for her multiple health conditions.   Objective:   Blood pressure 130/83, pulse 68, temperature 97.7 F (36.5 C), height 5\' 3"  (1.6 m), weight 237 lb (107.5 kg), SpO2 97 %. Body mass index is 41.98 kg/m.  General: Cooperative, alert, well developed, in no acute distress. HEENT: Conjunctivae and lids unremarkable. Cardiovascular: Regular rhythm.  Lungs: Normal work of breathing. Neurologic: No focal deficits.   Lab Results  Component Value Date   CREATININE 0.73 10/03/2019   BUN 19 10/03/2019   NA 144 10/03/2019   K 4.1 10/03/2019   CL 107 (H) 10/03/2019   CO2 22 10/03/2019   Lab Results  Component Value Date   ALT 14 10/03/2019   AST 21 10/03/2019   ALKPHOS 54 10/03/2019   BILITOT 0.5 10/03/2019   Lab Results  Component Value Date   HGBA1C 6.2 (H) 10/03/2019   HGBA1C 7.1 (H) 06/18/2019   HGBA1C 6.9 (A) 02/12/2019   HGBA1C 6.9 10/17/2018   HGBA1C 6.5 (H) 05/29/2018   Lab Results  Component Value Date   INSULIN 11.6 10/03/2019   INSULIN 22.7 06/18/2019   Lab Results  Component  Value Date   TSH 0.854 06/18/2019   Lab Results  Component Value Date   CHOL 191 10/03/2019   HDL 65 10/03/2019   LDLCALC 106 (H) 10/03/2019   TRIG 111 10/03/2019   CHOLHDL 3.2 02/12/2019   Lab Results  Component Value Date   WBC 5.5 06/18/2019   HGB 15.5 06/18/2019   HCT 45.1 06/18/2019   MCV 88 06/18/2019   PLT 188 06/18/2019   Attestation Statements:   Reviewed by clinician on day of visit: allergies, medications, problem list, medical history, surgical history, family history, social history, and previous encounter notes.  I, 08/16/2019, CMA, am acting as transcriptionist for Insurance claims handler, PA-C  I have reviewed the above documentation for accuracy and completeness, and I agree with the above. [name], PA-C

## 2020-02-20 ENCOUNTER — Other Ambulatory Visit: Payer: Self-pay

## 2020-02-20 ENCOUNTER — Encounter (INDEPENDENT_AMBULATORY_CARE_PROVIDER_SITE_OTHER): Payer: Self-pay | Admitting: Physician Assistant

## 2020-02-20 ENCOUNTER — Ambulatory Visit (INDEPENDENT_AMBULATORY_CARE_PROVIDER_SITE_OTHER): Payer: BC Managed Care – PPO | Admitting: Physician Assistant

## 2020-02-20 VITALS — BP 122/81 | HR 80 | Temp 98.3°F | Ht 63.0 in | Wt 235.0 lb

## 2020-02-20 DIAGNOSIS — E1169 Type 2 diabetes mellitus with other specified complication: Secondary | ICD-10-CM | POA: Diagnosis not present

## 2020-02-20 DIAGNOSIS — Z9189 Other specified personal risk factors, not elsewhere classified: Secondary | ICD-10-CM | POA: Diagnosis not present

## 2020-02-20 DIAGNOSIS — E785 Hyperlipidemia, unspecified: Secondary | ICD-10-CM

## 2020-02-20 DIAGNOSIS — E559 Vitamin D deficiency, unspecified: Secondary | ICD-10-CM

## 2020-02-20 DIAGNOSIS — Z6841 Body Mass Index (BMI) 40.0 and over, adult: Secondary | ICD-10-CM

## 2020-02-20 MED ORDER — VICTOZA 18 MG/3ML ~~LOC~~ SOPN: 1.8000 mg | PEN_INJECTOR | SUBCUTANEOUS | 0 refills | Status: AC

## 2020-02-20 MED ORDER — VITAMIN D (ERGOCALCIFEROL) 1.25 MG (50000 UNIT) PO CAPS: 50000.0000 [IU] | ORAL_CAPSULE | ORAL | 0 refills | Status: AC

## 2020-02-20 NOTE — Progress Notes (Signed)
Chief Complaint:   OBESITY Natalie Kim is here to discuss her progress with her obesity treatment plan along with follow-up of her obesity related diagnoses. Natalie Kim is on the Category 3 Plan and states she is following her eating plan approximately 80% of the time. Natalie Kim states she is doing cardio 30 minutes 2 times per week.  Today's visit was #: 14 Starting weight: 241 lbs Starting date: 06/18/2019 Today's weight: 235 lbs Today's date: 02/20/2020 Total lbs lost to date: 6 Total lbs lost since last in-office visit: 2  Interim History: Natalie Kim is very stressed with work currently. She continues to eat close to plan. She is having appropriate hunger signals and is not excessively hungry. She struggles during the weekend as she gets up later and is out of her routine.  Subjective:   Type 2 diabetes mellitus with hyperlipidemia (HCC). Cash is not currently taking Victoza or metformin due to previous nausea and bloating, now resolved.   Lab Results  Component Value Date   HGBA1C 6.2 (H) 10/03/2019   HGBA1C 7.1 (H) 06/18/2019   HGBA1C 6.9 (A) 02/12/2019   Lab Results  Component Value Date   LDLCALC 106 (H) 10/03/2019   CREATININE 0.73 10/03/2019   Lab Results  Component Value Date   INSULIN 11.6 10/03/2019   INSULIN 22.7 06/18/2019   Vitamin D deficiency. Natalie Kim is on prescription Vitamin D weekly.   Ref. Range 10/03/2019 15:11  Vitamin D, 25-Hydroxy Latest Ref Range: 30.0 - 100.0 ng/mL 31.9   At risk for osteoporosis. Natalie Kim is at higher risk of osteopenia and osteoporosis due to Vitamin D deficiency.   Assessment/Plan:   Type 2 diabetes mellitus with hyperlipidemia (HCC). Good blood sugar control is important to decrease the likelihood of diabetic complications such as nephropathy, neuropathy, limb loss, blindness, coronary artery disease, and death. Intensive lifestyle modification including diet, exercise and weight loss are the first line of  treatment for diabetes. Oliana will restart liraglutide (VICTOZA) 18 MG/3ML SOPN 0.6 mg daily #3 pens with 0 refills.  Vitamin D deficiency. Low Vitamin D level contributes to fatigue and are associated with obesity, breast, and colon cancer. She was given a refill on her Vitamin D, Ergocalciferol, (DRISDOL) 1.25 MG (50000 UNIT) CAPS capsule every week #4 with 0 refills and will follow-up for routine testing of Vitamin D, at least 2-3 times per year to avoid over-replacement.   At risk for osteoporosis. Anisten was given approximately 15 minutes of osteoporosis prevention counseling today. Natalie Kim is at risk for osteopenia and osteoporosis due to her Vitamin D deficiency. She was encouraged to take her Vitamin D and follow her higher calcium diet and increase strengthening exercise to help strengthen her bones and decrease her risk of osteopenia and osteoporosis.  Repetitive spaced learning was employed today to elicit superior memory formation and behavioral change.  Class 3 severe obesity with serious comorbidity and body mass index (BMI) of 40.0 to 44.9 in adult, unspecified obesity type (HCC).  Natalie Kim is currently in the action stage of change. As such, her goal is to continue with weight loss efforts. She has agreed to the Category 3 Plan and will journal 1500 calories and 95 grams of protein on the weekends.  She will have labs drawn at work.   Exercise goals: For substantial health benefits, adults should do at least 150 minutes (2 hours and 30 minutes) a week of moderate-intensity, or 75 minutes (1 hour and 15 minutes) a week of vigorous-intensity aerobic physical  activity, or an equivalent combination of moderate- and vigorous-intensity aerobic activity. Aerobic activity should be performed in episodes of at least 10 minutes, and preferably, it should be spread throughout the week.  Behavioral modification strategies: meal planning and cooking strategies and keeping healthy foods in  the home.  Natalie Kim has agreed to follow-up with our clinic in 2 weeks. She was informed of the importance of frequent follow-up visits to maximize her success with intensive lifestyle modifications for her multiple health conditions.   Objective:   Blood pressure 122/81, pulse 80, temperature 98.3 F (36.8 C), height 5\' 3"  (1.6 m), weight 235 lb (106.6 kg), SpO2 95 %. Body mass index is 41.63 kg/m.  General: Cooperative, alert, well developed, in no acute distress. HEENT: Conjunctivae and lids unremarkable. Cardiovascular: Regular rhythm.  Lungs: Normal work of breathing. Neurologic: No focal deficits.   Lab Results  Component Value Date   CREATININE 0.73 10/03/2019   BUN 19 10/03/2019   NA 144 10/03/2019   K 4.1 10/03/2019   CL 107 (H) 10/03/2019   CO2 22 10/03/2019   Lab Results  Component Value Date   ALT 14 10/03/2019   AST 21 10/03/2019   ALKPHOS 54 10/03/2019   BILITOT 0.5 10/03/2019   Lab Results  Component Value Date   HGBA1C 6.2 (H) 10/03/2019   HGBA1C 7.1 (H) 06/18/2019   HGBA1C 6.9 (A) 02/12/2019   HGBA1C 6.9 10/17/2018   HGBA1C 6.5 (H) 05/29/2018   Lab Results  Component Value Date   INSULIN 11.6 10/03/2019   INSULIN 22.7 06/18/2019   Lab Results  Component Value Date   TSH 0.854 06/18/2019   Lab Results  Component Value Date   CHOL 191 10/03/2019   HDL 65 10/03/2019   LDLCALC 106 (H) 10/03/2019   TRIG 111 10/03/2019   CHOLHDL 3.2 02/12/2019   Lab Results  Component Value Date   WBC 5.5 06/18/2019   HGB 15.5 06/18/2019   HCT 45.1 06/18/2019   MCV 88 06/18/2019   PLT 188 06/18/2019   No results found for: IRON, TIBC, FERRITIN  Attestation Statements:   Reviewed by clinician on day of visit: allergies, medications, problem list, medical history, surgical history, family history, social history, and previous encounter notes.  I04/05/2019, am acting as transcriptionist for Natalie Payment, PA-C   I have reviewed the above  documentation for accuracy and completeness, and I agree with the above. [name], PA-C

## 2020-02-25 ENCOUNTER — Telehealth (INDEPENDENT_AMBULATORY_CARE_PROVIDER_SITE_OTHER): Payer: BC Managed Care – PPO | Admitting: Psychology

## 2020-02-25 ENCOUNTER — Telehealth (INDEPENDENT_AMBULATORY_CARE_PROVIDER_SITE_OTHER): Payer: Self-pay | Admitting: Psychology

## 2020-02-25 DIAGNOSIS — Z20822 Contact with and (suspected) exposure to covid-19: Secondary | ICD-10-CM | POA: Diagnosis not present

## 2020-02-25 DIAGNOSIS — R051 Acute cough: Secondary | ICD-10-CM | POA: Diagnosis not present

## 2020-02-25 DIAGNOSIS — Z03818 Encounter for observation for suspected exposure to other biological agents ruled out: Secondary | ICD-10-CM | POA: Diagnosis not present

## 2020-02-25 DIAGNOSIS — J069 Acute upper respiratory infection, unspecified: Secondary | ICD-10-CM | POA: Diagnosis not present

## 2020-02-25 NOTE — Telephone Encounter (Signed)
  Office: [phone]  /  Fax: [phone]  Date of Call: February 25, 2020  Time of Call: 4:03pm Provider: Lawerance Cruel, PsyD  CONTENT: This provider called [name] to check-in as she did not present for today's MyChart Video Visit appointment at 4:00pm. A HIPAA compliant voicemail was left requesting a call back. Of note, this provider stayed on the MyChart Video Visit appointment for 5 minutes prior to signing off per the clinic's grace period policy.    PLAN:  This provider will wait for Kalyn to call back. No further follow-up planned by this provider.

## 2020-03-05 ENCOUNTER — Encounter (INDEPENDENT_AMBULATORY_CARE_PROVIDER_SITE_OTHER): Payer: Self-pay

## 2020-03-05 ENCOUNTER — Telehealth (INDEPENDENT_AMBULATORY_CARE_PROVIDER_SITE_OTHER): Payer: BC Managed Care – PPO | Admitting: Physician Assistant

## 2020-03-05 ENCOUNTER — Other Ambulatory Visit: Payer: Self-pay

## 2020-03-05 ENCOUNTER — Telehealth (INDEPENDENT_AMBULATORY_CARE_PROVIDER_SITE_OTHER): Payer: Self-pay

## 2020-03-05 ENCOUNTER — Encounter (INDEPENDENT_AMBULATORY_CARE_PROVIDER_SITE_OTHER): Payer: Self-pay | Admitting: Physician Assistant

## 2020-03-05 DIAGNOSIS — Z6841 Body Mass Index (BMI) 40.0 and over, adult: Secondary | ICD-10-CM | POA: Diagnosis not present

## 2020-03-05 DIAGNOSIS — E1169 Type 2 diabetes mellitus with other specified complication: Secondary | ICD-10-CM | POA: Diagnosis not present

## 2020-03-05 DIAGNOSIS — E785 Hyperlipidemia, unspecified: Secondary | ICD-10-CM

## 2020-03-05 DIAGNOSIS — E7849 Other hyperlipidemia: Secondary | ICD-10-CM

## 2020-03-05 NOTE — Telephone Encounter (Signed)
Pt contacted. Verbal telehealth consent obtained to conduct visit via video call.   Dorleen Kissel LPN 

## 2020-03-06 MED ORDER — BD PEN NEEDLE NANO 2ND GEN 32G X 4 MM MISC: 1.0000 | Freq: Two times a day (BID) | 0 refills | Status: AC

## 2020-03-10 NOTE — Progress Notes (Signed)
TeleHealth Visit:  Due to the COVID-19 pandemic, this visit was completed with telemedicine (audio/video) technology to reduce patient and provider exposure as well as to preserve personal protective equipment.   Natalie Kim has verbally consented to this TeleHealth visit. The patient is located at home, the provider is located at the [facility] office. The participants in this visit include the listed provider and patient. The visit was conducted today via video.  Chief Complaint: OBESITY Natalie Kim is here to discuss her progress with her obesity treatment plan along with follow-up of her obesity related diagnoses. Natalie Kim is keeping a food journal and adhering to recommended goals of 1500 calories and 95 grams of protein.   Today'Kim visit was #: 15 Starting weight: 241 lbs Starting date: 06/18/2019  Interim History: Natalie Kim was recently diagnosed with COVID and has been coughing and has fatigue. She is eating when she'Kim hungry, resting, and taking cough medicine.  Subjective:   Type 2 diabetes mellitus with hyperlipidemia (HCC). Natalie Kim is on Victoza 0.6 mg, which she is tolerating well. She denies polyphagia.  Lab Results  Component Value Date   HGBA1C 6.2 (H) 10/03/2019   HGBA1C 7.1 (H) 06/18/2019   HGBA1C 6.9 (A) 02/12/2019   Lab Results  Component Value Date   LDLCALC 106 (H) 10/03/2019   CREATININE 0.73 10/03/2019   Lab Results  Component Value Date   INSULIN 11.6 10/03/2019   INSULIN 22.7 06/18/2019   Other hyperlipidemia. Natalie Kim is on lovastatin. No chest pain or myalgias. She is managed by her PCP.  Lab Results  Component Value Date   CHOL 191 10/03/2019   HDL 65 10/03/2019   LDLCALC 106 (H) 10/03/2019   TRIG 111 10/03/2019   CHOLHDL 3.2 02/12/2019   Lab Results  Component Value Date   ALT 14 10/03/2019   AST 21 10/03/2019   ALKPHOS 54 10/03/2019   BILITOT 0.5 10/03/2019   The 10-year ASCVD risk score Denman George DC Jr., et al.,  2013) is: 1.2%   Values used to calculate the score:     Age: 49 years     Sex: Female     Is Non-Hispanic African American: No     Diabetic: Yes     Tobacco smoker: No     Systolic Blood Pressure: 122 mmHg     Is BP treated: No     HDL Cholesterol: 65 mg/dL     Total Cholesterol: 191 mg/dL  Assessment/Plan:   Type 2 diabetes mellitus with hyperlipidemia (HCC). Good blood sugar control is important to decrease the likelihood of diabetic complications such as nephropathy, neuropathy, limb loss, blindness, coronary artery disease, and death. Intensive lifestyle modification including diet, exercise and weight loss are the first line of treatment for diabetes. Natalie Kim was given a prescription for needles #100 with 0 refills.  Other hyperlipidemia. Cardiovascular risk and specific lipid/LDL goals reviewed.  We discussed several lifestyle modifications today and Natalie Kim will continue to work on diet, exercise and weight loss efforts. Orders and follow up as documented in patient record. She will continue her medication as directed and follow-up with her PCP as scheduled.  Counseling Intensive lifestyle modifications are the first line treatment for this issue. . Dietary changes: Increase soluble fiber. Decrease simple carbohydrates. . Exercise changes: Moderate to vigorous-intensity aerobic activity 150 minutes per week if tolerated. . Lipid-lowering medications: see documented in medical record.  Class 3 severe obesity with serious comorbidity and body mass index (BMI) of 40.0 to 44.9  in adult, unspecified obesity type (HCC).  Insulin Pen Needle (BD PEN NEEDLE NANO 2ND GEN) 32G X 4 MM MISC  Natalie Kim is currently in the action stage of change. As such, her goal is to continue with weight loss efforts. She has agreed to keeping a food journal and adhering to recommended goals of 1500 calories and 95 grams of protein. She will add protein shakes when needed to supplement due to lack of  appetite.  Exercise goals: For substantial health benefits, adults should do at least 150 minutes (2 hours and 30 minutes) a week of moderate-intensity, or 75 minutes (1 hour and 15 minutes) a week of vigorous-intensity aerobic physical activity, or an equivalent combination of moderate- and vigorous-intensity aerobic activity. Aerobic activity should be performed in episodes of at least 10 minutes, and preferably, it should be spread throughout the week.  Behavioral modification strategies: increasing lean protein intake and no skipping meals.  Natalie Kim has agreed to follow-up with our clinic in 2 weeks. She was informed of the importance of frequent follow-up visits to maximize her success with intensive lifestyle modifications for her multiple health conditions.  Objective:   VITALS: Per patient if applicable, see vitals. GENERAL: Alert and in no acute distress. CARDIOPULMONARY: No increased WOB. Speaking in clear sentences.  PSYCH: Pleasant and cooperative. Speech normal rate and rhythm. Affect is appropriate. Insight and judgement are appropriate. Attention is focused, linear, and appropriate.  NEURO: Oriented as arrived to appointment on time with no prompting.   Lab Results  Component Value Date   CREATININE 0.73 10/03/2019   BUN 19 10/03/2019   NA 144 10/03/2019   K 4.1 10/03/2019   CL 107 (H) 10/03/2019   CO2 22 10/03/2019   Lab Results  Component Value Date   ALT 14 10/03/2019   AST 21 10/03/2019   ALKPHOS 54 10/03/2019   BILITOT 0.5 10/03/2019   Lab Results  Component Value Date   HGBA1C 6.2 (H) 10/03/2019   HGBA1C 7.1 (H) 06/18/2019   HGBA1C 6.9 (A) 02/12/2019   HGBA1C 6.9 10/17/2018   HGBA1C 6.5 (H) 05/29/2018   Lab Results  Component Value Date   INSULIN 11.6 10/03/2019   INSULIN 22.7 06/18/2019   Lab Results  Component Value Date   TSH 0.854 06/18/2019   Lab Results  Component Value Date   CHOL 191 10/03/2019   HDL 65 10/03/2019   LDLCALC 106 (H)  10/03/2019   TRIG 111 10/03/2019   CHOLHDL 3.2 02/12/2019   Lab Results  Component Value Date   WBC 5.5 06/18/2019   HGB 15.5 06/18/2019   HCT 45.1 06/18/2019   MCV 88 06/18/2019   PLT 188 06/18/2019   No results found for: IRON, TIBC, FERRITIN  Attestation Statements:   Reviewed by clinician on day of visit: allergies, medications, problem list, medical history, surgical history, family history, social history, and previous encounter notes.  IMarianna Payment, am acting as transcriptionist for [name], PA-C   I have reviewed the above documentation for accuracy and completeness, and I agree with the above. [name], PA-C

## 2020-03-11 ENCOUNTER — Ambulatory Visit: Payer: Medicaid Other | Admitting: Gastroenterology

## 2020-03-19 ENCOUNTER — Ambulatory Visit (INDEPENDENT_AMBULATORY_CARE_PROVIDER_SITE_OTHER): Payer: Self-pay | Admitting: Adult Health

## 2020-03-24 ENCOUNTER — Ambulatory Visit (INDEPENDENT_AMBULATORY_CARE_PROVIDER_SITE_OTHER): Payer: Self-pay | Admitting: Physician Assistant

## 2020-04-02 ENCOUNTER — Ambulatory Visit (INDEPENDENT_AMBULATORY_CARE_PROVIDER_SITE_OTHER): Payer: BC Managed Care – PPO | Admitting: Physician Assistant

## 2020-04-09 DIAGNOSIS — L600 Ingrowing nail: Secondary | ICD-10-CM | POA: Diagnosis not present

## 2020-04-23 ENCOUNTER — Ambulatory Visit (INDEPENDENT_AMBULATORY_CARE_PROVIDER_SITE_OTHER): Payer: BC Managed Care – PPO | Admitting: Physician Assistant

## 2020-04-29 DIAGNOSIS — R519 Headache, unspecified: Secondary | ICD-10-CM | POA: Diagnosis not present

## 2020-04-29 DIAGNOSIS — Z20822 Contact with and (suspected) exposure to covid-19: Secondary | ICD-10-CM | POA: Diagnosis not present

## 2020-04-29 DIAGNOSIS — Z03818 Encounter for observation for suspected exposure to other biological agents ruled out: Secondary | ICD-10-CM | POA: Diagnosis not present

## 2020-05-22 ENCOUNTER — Ambulatory Visit
Admission: RE | Admit: 2020-05-22 | Discharge: 2020-05-22 | Disposition: A | Discharge status: Home / Self Care | Payer: BC Managed Care – PPO | Source: Ambulatory Visit | Attending: Obstetrics and Gynecology | Admitting: Obstetrics and Gynecology

## 2020-05-22 ENCOUNTER — Other Ambulatory Visit: Payer: Self-pay | Admitting: Obstetrics and Gynecology

## 2020-05-22 DIAGNOSIS — J209 Acute bronchitis, unspecified: Secondary | ICD-10-CM

## 2020-05-22 DIAGNOSIS — R0602 Shortness of breath: Secondary | ICD-10-CM | POA: Diagnosis not present

## 2020-05-22 DIAGNOSIS — R059 Cough, unspecified: Secondary | ICD-10-CM | POA: Diagnosis not present

## 2020-06-16 DIAGNOSIS — R0600 Dyspnea, unspecified: Secondary | ICD-10-CM | POA: Diagnosis not present

## 2020-06-16 DIAGNOSIS — U071 COVID-19: Secondary | ICD-10-CM | POA: Diagnosis not present

## 2020-06-25 DIAGNOSIS — Z20822 Contact with and (suspected) exposure to covid-19: Secondary | ICD-10-CM | POA: Diagnosis not present

## 2020-08-04 DIAGNOSIS — R5383 Other fatigue: Secondary | ICD-10-CM | POA: Diagnosis not present

## 2020-08-04 DIAGNOSIS — E119 Type 2 diabetes mellitus without complications: Secondary | ICD-10-CM | POA: Diagnosis not present

## 2020-08-04 DIAGNOSIS — E669 Obesity, unspecified: Secondary | ICD-10-CM | POA: Diagnosis not present

## 2020-08-04 DIAGNOSIS — Z6841 Body Mass Index (BMI) 40.0 and over, adult: Secondary | ICD-10-CM | POA: Diagnosis not present

## 2020-08-08 DIAGNOSIS — I10 Essential (primary) hypertension: Secondary | ICD-10-CM | POA: Diagnosis not present

## 2020-08-08 DIAGNOSIS — R5383 Other fatigue: Secondary | ICD-10-CM | POA: Diagnosis not present

## 2020-08-08 DIAGNOSIS — E785 Hyperlipidemia, unspecified: Secondary | ICD-10-CM | POA: Diagnosis not present

## 2020-08-08 DIAGNOSIS — E119 Type 2 diabetes mellitus without complications: Secondary | ICD-10-CM | POA: Diagnosis not present

## 2021-01-01 ENCOUNTER — Other Ambulatory Visit: Payer: Self-pay | Admitting: Internal Medicine

## 2021-01-01 DIAGNOSIS — R109 Unspecified abdominal pain: Secondary | ICD-10-CM

## 2021-01-07 ENCOUNTER — Ambulatory Visit
Admission: RE | Admit: 2021-01-07 | Discharge: 2021-01-07 | Disposition: A | Discharge status: Home / Self Care | Payer: BC Managed Care – PPO | Source: Ambulatory Visit | Attending: Internal Medicine | Admitting: Internal Medicine

## 2021-01-07 DIAGNOSIS — R109 Unspecified abdominal pain: Secondary | ICD-10-CM

## 2021-10-06 DIAGNOSIS — M7731 Calcaneal spur, right foot: Secondary | ICD-10-CM | POA: Diagnosis not present

## 2021-10-06 DIAGNOSIS — M7732 Calcaneal spur, left foot: Secondary | ICD-10-CM | POA: Diagnosis not present

## 2021-12-23 ENCOUNTER — Encounter (INDEPENDENT_AMBULATORY_CARE_PROVIDER_SITE_OTHER): Payer: Self-pay

## 2022-03-04 ENCOUNTER — Other Ambulatory Visit (HOSPITAL_BASED_OUTPATIENT_CLINIC_OR_DEPARTMENT_OTHER): Payer: Self-pay

## 2022-03-04 MED ORDER — MOUNJARO 10 MG/0.5ML ~~LOC~~ SOAJ
10.0000 mg | SUBCUTANEOUS | 1 refills | Status: DC
  Filled 2022-03-04: qty 2, 28d supply, fill #0
  Filled 2022-03-28: qty 2, 28d supply, fill #1

## 2022-03-05 DIAGNOSIS — M722 Plantar fascial fibromatosis: Secondary | ICD-10-CM | POA: Diagnosis not present

## 2022-03-24 DIAGNOSIS — M722 Plantar fascial fibromatosis: Secondary | ICD-10-CM | POA: Diagnosis not present

## 2022-03-30 ENCOUNTER — Other Ambulatory Visit (HOSPITAL_BASED_OUTPATIENT_CLINIC_OR_DEPARTMENT_OTHER): Payer: Self-pay

## 2022-03-30 DIAGNOSIS — E538 Deficiency of other specified B group vitamins: Secondary | ICD-10-CM | POA: Diagnosis not present

## 2022-05-02 ENCOUNTER — Other Ambulatory Visit (HOSPITAL_BASED_OUTPATIENT_CLINIC_OR_DEPARTMENT_OTHER): Payer: Self-pay

## 2022-05-05 ENCOUNTER — Other Ambulatory Visit (HOSPITAL_BASED_OUTPATIENT_CLINIC_OR_DEPARTMENT_OTHER): Payer: Self-pay

## 2022-05-06 ENCOUNTER — Other Ambulatory Visit (HOSPITAL_BASED_OUTPATIENT_CLINIC_OR_DEPARTMENT_OTHER): Payer: Self-pay

## 2022-05-06 MED ORDER — MOUNJARO 10 MG/0.5ML ~~LOC~~ SOAJ
10.0000 mg | SUBCUTANEOUS | 4 refills | Status: AC
  Filled 2022-05-06: qty 2, 28d supply, fill #0
  Filled 2022-07-09: qty 2, 28d supply, fill #1

## 2022-05-12 ENCOUNTER — Other Ambulatory Visit (HOSPITAL_BASED_OUTPATIENT_CLINIC_OR_DEPARTMENT_OTHER): Payer: Self-pay

## 2022-05-12 DIAGNOSIS — G4709 Other insomnia: Secondary | ICD-10-CM | POA: Diagnosis not present

## 2022-05-12 DIAGNOSIS — E1165 Type 2 diabetes mellitus with hyperglycemia: Secondary | ICD-10-CM | POA: Diagnosis not present

## 2022-05-12 DIAGNOSIS — F411 Generalized anxiety disorder: Secondary | ICD-10-CM | POA: Diagnosis not present

## 2022-05-12 MED ORDER — TRAZODONE HCL 50 MG PO TABS
50.0000 mg | ORAL_TABLET | Freq: Every day | ORAL | 1 refills | Status: AC
  Filled 2022-05-12: qty 30, 30d supply, fill #0
  Filled 2022-06-04: qty 30, 30d supply, fill #1
  Filled 2022-07-04: qty 30, 30d supply, fill #2

## 2022-05-12 MED ORDER — TRAZODONE HCL 100 MG PO TABS
100.0000 mg | ORAL_TABLET | Freq: Every day | ORAL | 1 refills | Status: AC
  Filled 2022-05-12 – 2022-06-04 (×2): qty 30, 30d supply, fill #0
  Filled 2022-07-04 – 2022-07-26 (×2): qty 30, 30d supply, fill #1

## 2022-05-12 MED ORDER — TRAZODONE HCL 50 MG PO TABS
50.0000 mg | ORAL_TABLET | Freq: Every day | ORAL | 1 refills | Status: AC
  Filled 2022-08-09: qty 90, 90d supply, fill #0

## 2022-06-04 ENCOUNTER — Other Ambulatory Visit (HOSPITAL_BASED_OUTPATIENT_CLINIC_OR_DEPARTMENT_OTHER): Payer: Self-pay

## 2022-06-04 MED ORDER — MOUNJARO 10 MG/0.5ML ~~LOC~~ SOAJ
10.0000 mg | SUBCUTANEOUS | 3 refills | Status: AC
  Filled 2022-06-04: qty 2, 28d supply, fill #0
  Filled 2022-07-04: qty 2, 28d supply, fill #1

## 2022-06-08 DIAGNOSIS — M25519 Pain in unspecified shoulder: Secondary | ICD-10-CM | POA: Diagnosis not present

## 2022-06-17 ENCOUNTER — Other Ambulatory Visit (HOSPITAL_BASED_OUTPATIENT_CLINIC_OR_DEPARTMENT_OTHER): Payer: Self-pay

## 2022-06-17 MED ORDER — ALPRAZOLAM 0.5 MG PO TABS
0.2500 mg | ORAL_TABLET | Freq: Two times a day (BID) | ORAL | 0 refills | Status: DC | PRN
  Filled 2022-06-17: qty 60, 30d supply, fill #0

## 2022-06-18 ENCOUNTER — Other Ambulatory Visit (HOSPITAL_BASED_OUTPATIENT_CLINIC_OR_DEPARTMENT_OTHER): Payer: Self-pay

## 2022-06-18 DIAGNOSIS — J029 Acute pharyngitis, unspecified: Secondary | ICD-10-CM | POA: Diagnosis not present

## 2022-07-04 ENCOUNTER — Other Ambulatory Visit (HOSPITAL_BASED_OUTPATIENT_CLINIC_OR_DEPARTMENT_OTHER): Payer: Self-pay

## 2022-07-05 ENCOUNTER — Other Ambulatory Visit (HOSPITAL_BASED_OUTPATIENT_CLINIC_OR_DEPARTMENT_OTHER): Payer: Self-pay

## 2022-07-05 ENCOUNTER — Other Ambulatory Visit: Payer: Self-pay

## 2022-07-06 ENCOUNTER — Other Ambulatory Visit (HOSPITAL_BASED_OUTPATIENT_CLINIC_OR_DEPARTMENT_OTHER): Payer: Self-pay

## 2022-07-07 ENCOUNTER — Encounter (HOSPITAL_BASED_OUTPATIENT_CLINIC_OR_DEPARTMENT_OTHER): Payer: Self-pay | Admitting: Pharmacist

## 2022-07-07 ENCOUNTER — Other Ambulatory Visit (HOSPITAL_BASED_OUTPATIENT_CLINIC_OR_DEPARTMENT_OTHER): Payer: Self-pay

## 2022-07-08 ENCOUNTER — Other Ambulatory Visit (HOSPITAL_BASED_OUTPATIENT_CLINIC_OR_DEPARTMENT_OTHER): Payer: Self-pay

## 2022-07-08 MED ORDER — MOUNJARO 12.5 MG/0.5ML ~~LOC~~ SOAJ
12.5000 mg | SUBCUTANEOUS | 3 refills | Status: AC
  Filled 2022-07-08: qty 2, 28d supply, fill #0

## 2022-07-09 ENCOUNTER — Other Ambulatory Visit: Payer: Self-pay

## 2022-07-09 ENCOUNTER — Other Ambulatory Visit (HOSPITAL_BASED_OUTPATIENT_CLINIC_OR_DEPARTMENT_OTHER): Payer: Self-pay

## 2022-07-09 ENCOUNTER — Encounter (HOSPITAL_BASED_OUTPATIENT_CLINIC_OR_DEPARTMENT_OTHER): Payer: Self-pay

## 2022-07-12 ENCOUNTER — Other Ambulatory Visit (HOSPITAL_BASED_OUTPATIENT_CLINIC_OR_DEPARTMENT_OTHER): Payer: Self-pay

## 2022-07-15 ENCOUNTER — Other Ambulatory Visit (HOSPITAL_BASED_OUTPATIENT_CLINIC_OR_DEPARTMENT_OTHER): Payer: Self-pay

## 2022-07-15 DIAGNOSIS — E538 Deficiency of other specified B group vitamins: Secondary | ICD-10-CM | POA: Diagnosis not present

## 2022-07-21 ENCOUNTER — Other Ambulatory Visit (HOSPITAL_BASED_OUTPATIENT_CLINIC_OR_DEPARTMENT_OTHER): Payer: Self-pay

## 2022-07-21 MED ORDER — TRAZODONE HCL 100 MG PO TABS
100.0000 mg | ORAL_TABLET | Freq: Every day | ORAL | 1 refills | Status: AC
  Filled 2022-07-21: qty 30, 30d supply, fill #0

## 2022-07-26 ENCOUNTER — Other Ambulatory Visit (HOSPITAL_BASED_OUTPATIENT_CLINIC_OR_DEPARTMENT_OTHER): Payer: Self-pay

## 2022-08-05 ENCOUNTER — Other Ambulatory Visit: Payer: Self-pay

## 2022-08-05 ENCOUNTER — Other Ambulatory Visit (HOSPITAL_BASED_OUTPATIENT_CLINIC_OR_DEPARTMENT_OTHER): Payer: Self-pay

## 2022-08-05 MED ORDER — MOUNJARO 12.5 MG/0.5ML ~~LOC~~ SOAJ
12.5000 mg | SUBCUTANEOUS | 11 refills | Status: AC
  Filled 2022-08-05 – 2022-10-01 (×2): qty 2, 28d supply, fill #0
  Filled 2022-10-23: qty 2, 28d supply, fill #1
  Filled 2022-11-17 – 2022-12-13 (×3): qty 2, 28d supply, fill #2

## 2022-08-09 ENCOUNTER — Other Ambulatory Visit (HOSPITAL_BASED_OUTPATIENT_CLINIC_OR_DEPARTMENT_OTHER): Payer: Self-pay

## 2022-08-09 ENCOUNTER — Other Ambulatory Visit: Payer: Self-pay

## 2022-08-10 ENCOUNTER — Other Ambulatory Visit (HOSPITAL_BASED_OUTPATIENT_CLINIC_OR_DEPARTMENT_OTHER): Payer: Self-pay

## 2022-08-10 DIAGNOSIS — E119 Type 2 diabetes mellitus without complications: Secondary | ICD-10-CM | POA: Diagnosis not present

## 2022-08-10 DIAGNOSIS — F419 Anxiety disorder, unspecified: Secondary | ICD-10-CM | POA: Diagnosis not present

## 2022-08-10 DIAGNOSIS — I10 Essential (primary) hypertension: Secondary | ICD-10-CM | POA: Diagnosis not present

## 2022-08-10 DIAGNOSIS — F32A Depression, unspecified: Secondary | ICD-10-CM | POA: Diagnosis not present

## 2022-08-10 MED ORDER — ALPRAZOLAM 0.5 MG PO TABS
0.2500 mg | ORAL_TABLET | Freq: Two times a day (BID) | ORAL | 2 refills | Status: AC | PRN
  Filled 2022-08-10: qty 60, 30d supply, fill #0
  Filled 2023-01-13: qty 60, 30d supply, fill #1

## 2022-08-10 MED ORDER — ESCITALOPRAM OXALATE 20 MG PO TABS
20.0000 mg | ORAL_TABLET | Freq: Every day | ORAL | 3 refills | Status: AC
  Filled 2022-08-10 – 2022-08-27 (×4): qty 30, 30d supply, fill #0
  Filled 2022-10-01 – 2022-10-19 (×2): qty 30, 30d supply, fill #1
  Filled 2022-11-17 – 2022-11-27 (×2): qty 30, 30d supply, fill #2
  Filled 2022-12-22: qty 30, 30d supply, fill #3
  Filled 2023-01-21: qty 30, 30d supply, fill #4
  Filled 2023-03-08: qty 30, 30d supply, fill #5
  Filled 2023-04-08: qty 30, 30d supply, fill #6
  Filled 2023-04-28 – 2023-05-04 (×2): qty 30, 30d supply, fill #7

## 2022-08-10 MED ORDER — TRAZODONE HCL 100 MG PO TABS
200.0000 mg | ORAL_TABLET | Freq: Every day | ORAL | 3 refills | Status: AC
  Filled 2022-08-10 – 2022-08-18 (×3): qty 60, 30d supply, fill #0
  Filled 2022-09-24: qty 60, 30d supply, fill #1
  Filled 2022-10-19: qty 60, 30d supply, fill #2
  Filled 2022-11-17 – 2022-11-27 (×2): qty 60, 30d supply, fill #3
  Filled 2022-12-22: qty 60, 30d supply, fill #4
  Filled 2023-01-21: qty 60, 30d supply, fill #5
  Filled 2023-03-08: qty 60, 30d supply, fill #6
  Filled 2023-04-08: qty 60, 30d supply, fill #7
  Filled 2023-04-28 – 2023-05-04 (×2): qty 60, 30d supply, fill #8

## 2022-08-15 ENCOUNTER — Other Ambulatory Visit (HOSPITAL_BASED_OUTPATIENT_CLINIC_OR_DEPARTMENT_OTHER): Payer: Self-pay

## 2022-08-15 MED ORDER — ALPRAZOLAM 0.5 MG PO TABS: 0.2500 mg | ORAL_TABLET | Freq: Two times a day (BID) | ORAL | 0 refills | Status: AC | PRN

## 2022-08-17 ENCOUNTER — Other Ambulatory Visit (HOSPITAL_BASED_OUTPATIENT_CLINIC_OR_DEPARTMENT_OTHER): Payer: Self-pay

## 2022-08-18 ENCOUNTER — Other Ambulatory Visit: Payer: Self-pay

## 2022-08-18 ENCOUNTER — Other Ambulatory Visit (HOSPITAL_BASED_OUTPATIENT_CLINIC_OR_DEPARTMENT_OTHER): Payer: Self-pay

## 2022-08-24 ENCOUNTER — Other Ambulatory Visit (HOSPITAL_BASED_OUTPATIENT_CLINIC_OR_DEPARTMENT_OTHER): Payer: Self-pay

## 2022-08-24 ENCOUNTER — Other Ambulatory Visit (HOSPITAL_COMMUNITY): Payer: Self-pay

## 2022-08-24 MED ORDER — MOUNJARO 10 MG/0.5ML ~~LOC~~ SOAJ
10.0000 mg | SUBCUTANEOUS | 3 refills | Status: AC
  Filled 2022-08-24: qty 2, 28d supply, fill #0
  Filled 2022-09-24 – 2022-10-19 (×2): qty 2, 28d supply, fill #1

## 2022-08-25 ENCOUNTER — Other Ambulatory Visit: Payer: Self-pay

## 2022-08-25 ENCOUNTER — Other Ambulatory Visit (HOSPITAL_BASED_OUTPATIENT_CLINIC_OR_DEPARTMENT_OTHER): Payer: Self-pay

## 2022-08-27 ENCOUNTER — Other Ambulatory Visit: Payer: Self-pay

## 2022-08-27 ENCOUNTER — Other Ambulatory Visit (HOSPITAL_BASED_OUTPATIENT_CLINIC_OR_DEPARTMENT_OTHER): Payer: Self-pay

## 2022-09-06 ENCOUNTER — Other Ambulatory Visit (HOSPITAL_BASED_OUTPATIENT_CLINIC_OR_DEPARTMENT_OTHER): Payer: Self-pay

## 2022-09-06 MED ORDER — ALPRAZOLAM 0.5 MG PO TABS
0.2500 mg | ORAL_TABLET | Freq: Two times a day (BID) | ORAL | 2 refills | Status: AC | PRN
  Filled 2022-09-07 – 2022-09-24 (×2): qty 60, 30d supply, fill #0
  Filled 2022-10-19 – 2022-11-04 (×3): qty 60, 30d supply, fill #1

## 2022-09-07 ENCOUNTER — Other Ambulatory Visit: Payer: Self-pay

## 2022-09-17 ENCOUNTER — Other Ambulatory Visit (HOSPITAL_BASED_OUTPATIENT_CLINIC_OR_DEPARTMENT_OTHER): Payer: Self-pay

## 2022-09-17 MED ORDER — MOUNJARO 10 MG/0.5ML ~~LOC~~ SOAJ
10.0000 mg | SUBCUTANEOUS | 3 refills | Status: AC
  Filled 2022-09-17: qty 2, 28d supply, fill #0
  Filled 2022-10-19: qty 2, 28d supply, fill #1

## 2022-09-24 ENCOUNTER — Other Ambulatory Visit (HOSPITAL_BASED_OUTPATIENT_CLINIC_OR_DEPARTMENT_OTHER): Payer: Self-pay

## 2022-09-24 ENCOUNTER — Other Ambulatory Visit: Payer: Self-pay

## 2022-10-01 ENCOUNTER — Other Ambulatory Visit (HOSPITAL_BASED_OUTPATIENT_CLINIC_OR_DEPARTMENT_OTHER): Payer: Self-pay

## 2022-10-04 ENCOUNTER — Other Ambulatory Visit: Payer: Self-pay

## 2022-10-15 ENCOUNTER — Other Ambulatory Visit (HOSPITAL_BASED_OUTPATIENT_CLINIC_OR_DEPARTMENT_OTHER): Payer: Self-pay

## 2022-10-15 MED ORDER — CYANOCOBALAMIN 1000 MCG/ML IJ SOLN
1000.0000 ug | INTRAMUSCULAR | 3 refills | Status: AC
  Filled 2022-10-15: qty 4, 28d supply, fill #0
  Filled 2022-11-08: qty 4, 28d supply, fill #1

## 2022-10-15 MED ORDER — ZOLPIDEM TARTRATE 10 MG PO TABS
10.0000 mg | ORAL_TABLET | Freq: Every day | ORAL | 0 refills | Status: DC
  Filled 2022-10-15: qty 30, 30d supply, fill #0

## 2022-10-18 ENCOUNTER — Other Ambulatory Visit (HOSPITAL_BASED_OUTPATIENT_CLINIC_OR_DEPARTMENT_OTHER): Payer: Self-pay

## 2022-10-18 MED ORDER — NURTEC 75 MG PO TBDP
75.0000 mg | ORAL_TABLET | ORAL | 5 refills | Status: AC
  Filled 2022-10-18: qty 15, 30d supply, fill #0
  Filled 2022-10-25 – 2022-12-01 (×3): qty 15, 30d supply, fill #1
  Filled 2022-12-10 – 2023-03-08 (×3): qty 8, 30d supply, fill #1

## 2022-10-19 ENCOUNTER — Other Ambulatory Visit: Payer: Self-pay

## 2022-10-19 ENCOUNTER — Other Ambulatory Visit (HOSPITAL_BASED_OUTPATIENT_CLINIC_OR_DEPARTMENT_OTHER): Payer: Self-pay

## 2022-10-22 ENCOUNTER — Encounter (HOSPITAL_BASED_OUTPATIENT_CLINIC_OR_DEPARTMENT_OTHER): Payer: Self-pay

## 2022-10-22 ENCOUNTER — Other Ambulatory Visit (HOSPITAL_BASED_OUTPATIENT_CLINIC_OR_DEPARTMENT_OTHER): Payer: Self-pay

## 2022-10-22 ENCOUNTER — Other Ambulatory Visit: Payer: Self-pay

## 2022-10-22 MED ORDER — ONDANSETRON 8 MG PO TBDP
8.0000 mg | ORAL_TABLET | Freq: Two times a day (BID) | ORAL | 1 refills | Status: AC
  Filled 2022-10-22 – 2022-11-04 (×3): qty 20, 10d supply, fill #0

## 2022-10-22 MED ORDER — ESTRADIOL 10 MCG VA TABS
1.0000 | ORAL_TABLET | Freq: Every evening | VAGINAL | 0 refills | Status: DC
  Filled 2022-10-22: qty 32, 84d supply, fill #0
  Filled 2022-10-25: qty 40, 40d supply, fill #0
  Filled 2022-12-10: qty 8, 28d supply, fill #1

## 2022-10-23 ENCOUNTER — Other Ambulatory Visit (HOSPITAL_BASED_OUTPATIENT_CLINIC_OR_DEPARTMENT_OTHER): Payer: Self-pay

## 2022-10-23 ENCOUNTER — Other Ambulatory Visit: Payer: Self-pay

## 2022-10-24 ENCOUNTER — Other Ambulatory Visit (HOSPITAL_BASED_OUTPATIENT_CLINIC_OR_DEPARTMENT_OTHER): Payer: Self-pay

## 2022-10-25 ENCOUNTER — Other Ambulatory Visit (HOSPITAL_BASED_OUTPATIENT_CLINIC_OR_DEPARTMENT_OTHER): Payer: Self-pay

## 2022-10-25 DIAGNOSIS — M792 Neuralgia and neuritis, unspecified: Secondary | ICD-10-CM | POA: Diagnosis not present

## 2022-10-25 MED ORDER — GABAPENTIN 300 MG PO CAPS
300.0000 mg | ORAL_CAPSULE | Freq: Two times a day (BID) | ORAL | 6 refills | Status: AC
  Filled 2022-10-25: qty 60, 30d supply, fill #0

## 2022-10-26 ENCOUNTER — Other Ambulatory Visit: Payer: Self-pay

## 2022-10-26 ENCOUNTER — Other Ambulatory Visit (HOSPITAL_BASED_OUTPATIENT_CLINIC_OR_DEPARTMENT_OTHER): Payer: Self-pay

## 2022-11-04 ENCOUNTER — Other Ambulatory Visit (HOSPITAL_BASED_OUTPATIENT_CLINIC_OR_DEPARTMENT_OTHER): Payer: Self-pay

## 2022-11-05 ENCOUNTER — Other Ambulatory Visit: Payer: Self-pay

## 2022-11-05 ENCOUNTER — Other Ambulatory Visit (HOSPITAL_BASED_OUTPATIENT_CLINIC_OR_DEPARTMENT_OTHER): Payer: Self-pay

## 2022-11-08 ENCOUNTER — Other Ambulatory Visit (HOSPITAL_BASED_OUTPATIENT_CLINIC_OR_DEPARTMENT_OTHER): Payer: Self-pay

## 2022-11-09 ENCOUNTER — Other Ambulatory Visit: Payer: Self-pay

## 2022-11-10 ENCOUNTER — Other Ambulatory Visit: Payer: Self-pay

## 2022-11-12 ENCOUNTER — Other Ambulatory Visit (HOSPITAL_BASED_OUTPATIENT_CLINIC_OR_DEPARTMENT_OTHER): Payer: Self-pay

## 2022-11-12 MED ORDER — ZOLPIDEM TARTRATE 10 MG PO TABS
ORAL_TABLET | ORAL | 0 refills | Status: AC
  Filled 2022-11-12: qty 30, 30d supply, fill #0

## 2022-11-17 ENCOUNTER — Other Ambulatory Visit (HOSPITAL_BASED_OUTPATIENT_CLINIC_OR_DEPARTMENT_OTHER): Payer: Self-pay

## 2022-11-19 ENCOUNTER — Other Ambulatory Visit (HOSPITAL_BASED_OUTPATIENT_CLINIC_OR_DEPARTMENT_OTHER): Payer: Self-pay

## 2022-11-19 ENCOUNTER — Encounter (HOSPITAL_BASED_OUTPATIENT_CLINIC_OR_DEPARTMENT_OTHER): Payer: Self-pay

## 2022-11-19 ENCOUNTER — Other Ambulatory Visit: Payer: Self-pay

## 2022-11-21 ENCOUNTER — Other Ambulatory Visit (HOSPITAL_BASED_OUTPATIENT_CLINIC_OR_DEPARTMENT_OTHER): Payer: Self-pay

## 2022-11-27 ENCOUNTER — Other Ambulatory Visit (HOSPITAL_BASED_OUTPATIENT_CLINIC_OR_DEPARTMENT_OTHER): Payer: Self-pay

## 2022-11-29 ENCOUNTER — Other Ambulatory Visit (HOSPITAL_BASED_OUTPATIENT_CLINIC_OR_DEPARTMENT_OTHER): Payer: Self-pay

## 2022-11-29 ENCOUNTER — Encounter (HOSPITAL_BASED_OUTPATIENT_CLINIC_OR_DEPARTMENT_OTHER): Payer: Self-pay

## 2022-11-30 ENCOUNTER — Other Ambulatory Visit (HOSPITAL_BASED_OUTPATIENT_CLINIC_OR_DEPARTMENT_OTHER): Payer: Self-pay

## 2022-11-30 MED ORDER — PREGABALIN 50 MG PO CAPS
ORAL_CAPSULE | ORAL | 0 refills | Status: AC
  Filled 2022-11-30: qty 42, 21d supply, fill #0

## 2022-12-01 ENCOUNTER — Other Ambulatory Visit: Payer: Self-pay

## 2022-12-01 ENCOUNTER — Other Ambulatory Visit (HOSPITAL_BASED_OUTPATIENT_CLINIC_OR_DEPARTMENT_OTHER): Payer: Self-pay

## 2022-12-02 ENCOUNTER — Other Ambulatory Visit: Payer: Self-pay

## 2022-12-03 ENCOUNTER — Other Ambulatory Visit (HOSPITAL_BASED_OUTPATIENT_CLINIC_OR_DEPARTMENT_OTHER): Payer: Self-pay

## 2022-12-10 ENCOUNTER — Other Ambulatory Visit (HOSPITAL_BASED_OUTPATIENT_CLINIC_OR_DEPARTMENT_OTHER): Payer: Self-pay

## 2022-12-11 ENCOUNTER — Other Ambulatory Visit (HOSPITAL_BASED_OUTPATIENT_CLINIC_OR_DEPARTMENT_OTHER): Payer: Self-pay

## 2022-12-16 ENCOUNTER — Other Ambulatory Visit (HOSPITAL_BASED_OUTPATIENT_CLINIC_OR_DEPARTMENT_OTHER): Payer: Self-pay

## 2022-12-18 ENCOUNTER — Other Ambulatory Visit (HOSPITAL_BASED_OUTPATIENT_CLINIC_OR_DEPARTMENT_OTHER): Payer: Self-pay

## 2022-12-18 MED ORDER — NURTEC 75 MG PO TBDP
75.0000 mg | ORAL_TABLET | ORAL | 6 refills | Status: DC
  Filled 2022-12-18: qty 16, 32d supply, fill #0

## 2022-12-18 MED ORDER — MOUNJARO 5 MG/0.5ML ~~LOC~~ SOAJ
5.0000 mg | SUBCUTANEOUS | 2 refills | Status: AC
  Filled 2022-12-18 – 2023-01-21 (×4): qty 2, 28d supply, fill #0

## 2022-12-18 MED ORDER — BUSPIRONE HCL 15 MG PO TABS
15.0000 mg | ORAL_TABLET | Freq: Two times a day (BID) | ORAL | 2 refills | Status: DC
  Filled 2022-12-18: qty 60, 30d supply, fill #0
  Filled 2023-01-25: qty 60, 30d supply, fill #1
  Filled 2023-03-08: qty 60, 30d supply, fill #2

## 2022-12-20 ENCOUNTER — Encounter (HOSPITAL_BASED_OUTPATIENT_CLINIC_OR_DEPARTMENT_OTHER): Payer: Self-pay

## 2022-12-20 ENCOUNTER — Other Ambulatory Visit (HOSPITAL_BASED_OUTPATIENT_CLINIC_OR_DEPARTMENT_OTHER): Payer: Self-pay

## 2022-12-22 ENCOUNTER — Other Ambulatory Visit: Payer: Self-pay

## 2022-12-22 ENCOUNTER — Other Ambulatory Visit (HOSPITAL_BASED_OUTPATIENT_CLINIC_OR_DEPARTMENT_OTHER): Payer: Self-pay

## 2022-12-22 MED ORDER — PREGABALIN 50 MG PO CAPS
50.0000 mg | ORAL_CAPSULE | Freq: Three times a day (TID) | ORAL | 1 refills | Status: DC
  Filled 2022-12-22: qty 90, 30d supply, fill #0
  Filled 2023-02-18: qty 90, 30d supply, fill #1

## 2022-12-27 ENCOUNTER — Other Ambulatory Visit (HOSPITAL_BASED_OUTPATIENT_CLINIC_OR_DEPARTMENT_OTHER): Payer: Self-pay

## 2022-12-28 ENCOUNTER — Other Ambulatory Visit (HOSPITAL_BASED_OUTPATIENT_CLINIC_OR_DEPARTMENT_OTHER): Payer: Self-pay

## 2022-12-29 ENCOUNTER — Other Ambulatory Visit (HOSPITAL_COMMUNITY): Payer: Self-pay

## 2022-12-29 ENCOUNTER — Other Ambulatory Visit (HOSPITAL_BASED_OUTPATIENT_CLINIC_OR_DEPARTMENT_OTHER): Payer: Self-pay

## 2022-12-29 ENCOUNTER — Other Ambulatory Visit: Payer: Self-pay

## 2022-12-31 ENCOUNTER — Other Ambulatory Visit (HOSPITAL_BASED_OUTPATIENT_CLINIC_OR_DEPARTMENT_OTHER): Payer: Self-pay

## 2023-01-21 ENCOUNTER — Other Ambulatory Visit: Payer: Self-pay

## 2023-01-21 ENCOUNTER — Other Ambulatory Visit (HOSPITAL_BASED_OUTPATIENT_CLINIC_OR_DEPARTMENT_OTHER): Payer: Self-pay

## 2023-01-24 ENCOUNTER — Other Ambulatory Visit (HOSPITAL_BASED_OUTPATIENT_CLINIC_OR_DEPARTMENT_OTHER): Payer: Self-pay

## 2023-02-01 ENCOUNTER — Other Ambulatory Visit (HOSPITAL_BASED_OUTPATIENT_CLINIC_OR_DEPARTMENT_OTHER): Payer: Self-pay

## 2023-02-06 ENCOUNTER — Other Ambulatory Visit (HOSPITAL_BASED_OUTPATIENT_CLINIC_OR_DEPARTMENT_OTHER): Payer: Self-pay

## 2023-02-07 ENCOUNTER — Other Ambulatory Visit (HOSPITAL_BASED_OUTPATIENT_CLINIC_OR_DEPARTMENT_OTHER): Payer: Self-pay

## 2023-02-08 ENCOUNTER — Other Ambulatory Visit (HOSPITAL_BASED_OUTPATIENT_CLINIC_OR_DEPARTMENT_OTHER): Payer: Self-pay

## 2023-02-08 ENCOUNTER — Other Ambulatory Visit: Payer: Self-pay

## 2023-02-09 ENCOUNTER — Other Ambulatory Visit (HOSPITAL_BASED_OUTPATIENT_CLINIC_OR_DEPARTMENT_OTHER): Payer: Self-pay

## 2023-02-11 ENCOUNTER — Other Ambulatory Visit (HOSPITAL_BASED_OUTPATIENT_CLINIC_OR_DEPARTMENT_OTHER): Payer: Self-pay

## 2023-02-12 ENCOUNTER — Other Ambulatory Visit (HOSPITAL_BASED_OUTPATIENT_CLINIC_OR_DEPARTMENT_OTHER): Payer: Self-pay

## 2023-02-16 ENCOUNTER — Other Ambulatory Visit (HOSPITAL_BASED_OUTPATIENT_CLINIC_OR_DEPARTMENT_OTHER): Payer: Self-pay

## 2023-02-18 ENCOUNTER — Other Ambulatory Visit (HOSPITAL_BASED_OUTPATIENT_CLINIC_OR_DEPARTMENT_OTHER): Payer: Self-pay

## 2023-02-18 ENCOUNTER — Other Ambulatory Visit: Payer: Self-pay

## 2023-02-21 ENCOUNTER — Other Ambulatory Visit: Payer: Self-pay

## 2023-02-21 ENCOUNTER — Other Ambulatory Visit (HOSPITAL_BASED_OUTPATIENT_CLINIC_OR_DEPARTMENT_OTHER): Payer: Self-pay

## 2023-02-21 MED ORDER — ESTRADIOL 10 MCG VA TABS
10.0000 ug | ORAL_TABLET | Freq: Every evening | VAGINAL | 0 refills | Status: DC
  Filled 2023-02-21: qty 48, 48d supply, fill #0

## 2023-02-25 ENCOUNTER — Other Ambulatory Visit (HOSPITAL_BASED_OUTPATIENT_CLINIC_OR_DEPARTMENT_OTHER): Payer: Self-pay

## 2023-02-27 ENCOUNTER — Other Ambulatory Visit (HOSPITAL_BASED_OUTPATIENT_CLINIC_OR_DEPARTMENT_OTHER): Payer: Self-pay

## 2023-03-08 ENCOUNTER — Other Ambulatory Visit (HOSPITAL_BASED_OUTPATIENT_CLINIC_OR_DEPARTMENT_OTHER): Payer: Self-pay

## 2023-03-08 MED ORDER — ESTRADIOL 10 MCG VA TABS
10.0000 ug | ORAL_TABLET | VAGINAL | 7 refills | Status: AC
Start: 2023-03-08 — End: ?
  Filled 2023-03-08: qty 48, 48d supply, fill #0
  Filled 2023-04-05: qty 24, 30d supply, fill #0
  Filled 2023-05-04: qty 24, 30d supply, fill #1
  Filled 2023-06-20: qty 24, 30d supply, fill #2
  Filled 2023-09-05: qty 24, 84d supply, fill #2

## 2023-03-09 ENCOUNTER — Other Ambulatory Visit (HOSPITAL_BASED_OUTPATIENT_CLINIC_OR_DEPARTMENT_OTHER): Payer: Self-pay

## 2023-03-09 ENCOUNTER — Other Ambulatory Visit: Payer: Self-pay

## 2023-04-05 ENCOUNTER — Other Ambulatory Visit (HOSPITAL_BASED_OUTPATIENT_CLINIC_OR_DEPARTMENT_OTHER): Payer: Self-pay

## 2023-04-05 ENCOUNTER — Other Ambulatory Visit: Payer: Self-pay

## 2023-04-05 MED ORDER — PREGABALIN 50 MG PO CAPS
50.0000 mg | ORAL_CAPSULE | Freq: Three times a day (TID) | ORAL | 2 refills | Status: AC
  Filled 2023-04-05: qty 90, 30d supply, fill #0
  Filled 2023-06-20: qty 90, 30d supply, fill #1
  Filled 2023-08-23: qty 90, 30d supply, fill #2

## 2023-04-28 ENCOUNTER — Other Ambulatory Visit (HOSPITAL_BASED_OUTPATIENT_CLINIC_OR_DEPARTMENT_OTHER): Payer: Self-pay

## 2023-05-04 ENCOUNTER — Other Ambulatory Visit (HOSPITAL_BASED_OUTPATIENT_CLINIC_OR_DEPARTMENT_OTHER): Payer: Self-pay

## 2023-05-04 ENCOUNTER — Other Ambulatory Visit: Payer: Self-pay

## 2023-06-04 DIAGNOSIS — F419 Anxiety disorder, unspecified: Secondary | ICD-10-CM | POA: Diagnosis not present

## 2023-06-04 DIAGNOSIS — G43909 Migraine, unspecified, not intractable, without status migrainosus: Secondary | ICD-10-CM | POA: Diagnosis not present

## 2023-06-04 DIAGNOSIS — M797 Fibromyalgia: Secondary | ICD-10-CM | POA: Diagnosis not present

## 2023-06-04 DIAGNOSIS — E1342 Other specified diabetes mellitus with diabetic polyneuropathy: Secondary | ICD-10-CM | POA: Diagnosis not present

## 2023-06-06 ENCOUNTER — Other Ambulatory Visit: Payer: Self-pay

## 2023-06-06 MED ORDER — ESCITALOPRAM OXALATE 20 MG PO TABS
20.0000 mg | ORAL_TABLET | Freq: Every day | ORAL | 1 refills | Status: AC
  Filled 2023-06-06: qty 90, 90d supply, fill #0
  Filled 2023-09-05: qty 90, 90d supply, fill #1

## 2023-06-06 MED ORDER — BUSPIRONE HCL 15 MG PO TABS
15.0000 mg | ORAL_TABLET | Freq: Two times a day (BID) | ORAL | 1 refills | Status: AC
  Filled 2023-06-06: qty 180, 90d supply, fill #0
  Filled 2023-09-05: qty 180, 90d supply, fill #1

## 2023-06-06 MED ORDER — ALPRAZOLAM 0.5 MG PO TABS
0.5000 mg | ORAL_TABLET | Freq: Two times a day (BID) | ORAL | 2 refills | Status: AC
  Filled 2023-06-06: qty 60, 30d supply, fill #0
  Filled 2023-08-23: qty 60, 30d supply, fill #1

## 2023-06-06 MED ORDER — TRAZODONE HCL 100 MG PO TABS
100.0000 mg | ORAL_TABLET | Freq: Every day | ORAL | 1 refills | Status: AC
  Filled 2023-06-06: qty 90, 90d supply, fill #0
  Filled 2023-09-05: qty 90, 90d supply, fill #1

## 2023-06-09 ENCOUNTER — Other Ambulatory Visit: Payer: Self-pay

## 2023-06-21 ENCOUNTER — Other Ambulatory Visit: Payer: Self-pay

## 2023-06-22 ENCOUNTER — Other Ambulatory Visit: Payer: Self-pay

## 2023-06-29 ENCOUNTER — Other Ambulatory Visit: Payer: Self-pay

## 2023-07-27 ENCOUNTER — Other Ambulatory Visit (HOSPITAL_BASED_OUTPATIENT_CLINIC_OR_DEPARTMENT_OTHER): Payer: Self-pay

## 2023-08-24 ENCOUNTER — Other Ambulatory Visit: Payer: Self-pay

## 2023-08-30 ENCOUNTER — Other Ambulatory Visit: Payer: Self-pay

## 2023-09-05 ENCOUNTER — Other Ambulatory Visit: Payer: Self-pay

## 2023-09-06 ENCOUNTER — Other Ambulatory Visit: Payer: Self-pay

## 2023-09-15 DIAGNOSIS — F324 Major depressive disorder, single episode, in partial remission: Secondary | ICD-10-CM | POA: Diagnosis not present

## 2023-09-15 DIAGNOSIS — M797 Fibromyalgia: Secondary | ICD-10-CM | POA: Diagnosis not present

## 2023-09-15 DIAGNOSIS — E1349 Other specified diabetes mellitus with other diabetic neurological complication: Secondary | ICD-10-CM | POA: Diagnosis not present

## 2023-09-15 DIAGNOSIS — F419 Anxiety disorder, unspecified: Secondary | ICD-10-CM | POA: Diagnosis not present

## 2023-09-15 DIAGNOSIS — G43909 Migraine, unspecified, not intractable, without status migrainosus: Secondary | ICD-10-CM | POA: Diagnosis not present

## 2023-11-15 DIAGNOSIS — F419 Anxiety disorder, unspecified: Secondary | ICD-10-CM | POA: Diagnosis not present

## 2023-11-15 DIAGNOSIS — G43909 Migraine, unspecified, not intractable, without status migrainosus: Secondary | ICD-10-CM | POA: Diagnosis not present

## 2023-11-15 DIAGNOSIS — E119 Type 2 diabetes mellitus without complications: Secondary | ICD-10-CM | POA: Diagnosis not present

## 2023-11-15 DIAGNOSIS — M797 Fibromyalgia: Secondary | ICD-10-CM | POA: Diagnosis not present

## 2023-12-20 DIAGNOSIS — M546 Pain in thoracic spine: Secondary | ICD-10-CM | POA: Diagnosis not present

## 2023-12-20 DIAGNOSIS — M549 Dorsalgia, unspecified: Secondary | ICD-10-CM | POA: Diagnosis not present

## 2024-02-07 DIAGNOSIS — Z1231 Encounter for screening mammogram for malignant neoplasm of breast: Secondary | ICD-10-CM | POA: Diagnosis not present
